# Patient Record
Sex: Male | Born: 1935 | Race: White | Hispanic: No | Smoking: Former smoker
Health system: Southern US, Community
[De-identification: ages and names within clinical notes are randomized; demographics above are authoritative.]

## PROBLEM LIST (undated history)

## (undated) DIAGNOSIS — H409 Unspecified glaucoma: Secondary | ICD-10-CM

## (undated) DIAGNOSIS — IMO0001 Reserved for inherently not codable concepts without codable children: Secondary | ICD-10-CM

## (undated) DIAGNOSIS — K219 Gastro-esophageal reflux disease without esophagitis: Secondary | ICD-10-CM

## (undated) DIAGNOSIS — M1990 Unspecified osteoarthritis, unspecified site: Secondary | ICD-10-CM

## (undated) DIAGNOSIS — C61 Malignant neoplasm of prostate: Secondary | ICD-10-CM

## (undated) DIAGNOSIS — K5900 Constipation, unspecified: Secondary | ICD-10-CM

## (undated) DIAGNOSIS — H9190 Unspecified hearing loss, unspecified ear: Secondary | ICD-10-CM

## (undated) DIAGNOSIS — G709 Myoneural disorder, unspecified: Secondary | ICD-10-CM

## (undated) DIAGNOSIS — R109 Unspecified abdominal pain: Secondary | ICD-10-CM

## (undated) DIAGNOSIS — G459 Transient cerebral ischemic attack, unspecified: Secondary | ICD-10-CM

## (undated) DIAGNOSIS — K529 Noninfective gastroenteritis and colitis, unspecified: Secondary | ICD-10-CM

## (undated) DIAGNOSIS — J449 Chronic obstructive pulmonary disease, unspecified: Secondary | ICD-10-CM

## (undated) DIAGNOSIS — Z972 Presence of dental prosthetic device (complete) (partial): Secondary | ICD-10-CM

## (undated) DIAGNOSIS — J45909 Unspecified asthma, uncomplicated: Secondary | ICD-10-CM

## (undated) DIAGNOSIS — R42 Dizziness and giddiness: Secondary | ICD-10-CM

## (undated) DIAGNOSIS — N4281 Prostatodynia syndrome: Secondary | ICD-10-CM

## (undated) DIAGNOSIS — I10 Essential (primary) hypertension: Secondary | ICD-10-CM

## (undated) DIAGNOSIS — H3530 Unspecified macular degeneration: Secondary | ICD-10-CM

## (undated) DIAGNOSIS — E785 Hyperlipidemia, unspecified: Secondary | ICD-10-CM

## (undated) DIAGNOSIS — I6529 Occlusion and stenosis of unspecified carotid artery: Secondary | ICD-10-CM

## (undated) DIAGNOSIS — I70209 Unspecified atherosclerosis of native arteries of extremities, unspecified extremity: Secondary | ICD-10-CM

## (undated) DIAGNOSIS — I2510 Atherosclerotic heart disease of native coronary artery without angina pectoris: Secondary | ICD-10-CM

## (undated) HISTORY — DX: Occlusion and stenosis of unspecified carotid artery: I65.29

## (undated) HISTORY — DX: Chronic obstructive pulmonary disease, unspecified: J44.9

## (undated) HISTORY — DX: Prostatodynia syndrome: N42.81

## (undated) HISTORY — DX: Gastro-esophageal reflux disease without esophagitis: K21.9

## (undated) HISTORY — DX: Unspecified atherosclerosis of native arteries of extremities, unspecified extremity: I70.209

## (undated) HISTORY — PX: CATARACT EXTRACTION: SUR2

## (undated) HISTORY — DX: Hyperlipidemia, unspecified: E78.5

## (undated) HISTORY — DX: Essential (primary) hypertension: I10

---

## 1984-02-16 HISTORY — PX: OTHER SURGICAL HISTORY: SHX169

## 1990-02-15 HISTORY — PX: HERNIA REPAIR: SHX51

## 1998-02-15 HISTORY — PX: CORONARY ARTERY BYPASS GRAFT: SHX141

## 1999-02-16 HISTORY — PX: PROSTATE SURGERY: SHX751

## 2001-02-15 HISTORY — PX: CAROTID ENDARTERECTOMY: SUR193

## 2004-06-08 ENCOUNTER — Ambulatory Visit: Payer: Self-pay | Admitting: Unknown Physician Specialty

## 2004-06-08 LAB — HM COLONOSCOPY

## 2007-06-28 ENCOUNTER — Ambulatory Visit: Payer: Self-pay | Admitting: Family Medicine

## 2009-06-16 ENCOUNTER — Ambulatory Visit: Payer: Self-pay | Admitting: Family Medicine

## 2009-09-17 ENCOUNTER — Ambulatory Visit: Payer: Self-pay | Admitting: Family Medicine

## 2010-01-28 ENCOUNTER — Ambulatory Visit: Payer: Self-pay | Admitting: Family Medicine

## 2012-06-28 DIAGNOSIS — H1810 Bullous keratopathy, unspecified eye: Secondary | ICD-10-CM | POA: Insufficient documentation

## 2012-08-16 ENCOUNTER — Encounter: Payer: Self-pay | Admitting: *Deleted

## 2012-11-21 DIAGNOSIS — N19 Unspecified kidney failure: Secondary | ICD-10-CM | POA: Insufficient documentation

## 2012-11-22 DIAGNOSIS — I10 Essential (primary) hypertension: Secondary | ICD-10-CM | POA: Insufficient documentation

## 2013-01-15 ENCOUNTER — Ambulatory Visit: Payer: Self-pay | Admitting: Oncology

## 2013-01-29 ENCOUNTER — Ambulatory Visit: Payer: Self-pay | Admitting: Family Medicine

## 2013-02-04 ENCOUNTER — Inpatient Hospital Stay: Payer: Self-pay | Admitting: Internal Medicine

## 2013-02-04 LAB — COMPREHENSIVE METABOLIC PANEL
Albumin: 3.7 g/dL (ref 3.4–5.0)
Alkaline Phosphatase: 65 U/L
Anion Gap: 4 — ABNORMAL LOW (ref 7–16)
BUN: 19 mg/dL — ABNORMAL HIGH (ref 7–18)
Bilirubin,Total: 0.4 mg/dL (ref 0.2–1.0)
Calcium, Total: 8.8 mg/dL (ref 8.5–10.1)
Chloride: 107 mmol/L (ref 98–107)
Co2: 29 mmol/L (ref 21–32)
Creatinine: 1.37 mg/dL — ABNORMAL HIGH (ref 0.60–1.30)
EGFR (African American): 54 — ABNORMAL LOW
EGFR (Non-African Amer.): 47 — ABNORMAL LOW
Glucose: 105 mg/dL — ABNORMAL HIGH (ref 65–99)
Osmolality: 282 (ref 275–301)
Potassium: 4.3 mmol/L (ref 3.5–5.1)
SGOT(AST): 15 U/L (ref 15–37)
SGPT (ALT): 17 U/L (ref 12–78)
Sodium: 140 mmol/L (ref 136–145)
Total Protein: 7 g/dL (ref 6.4–8.2)

## 2013-02-04 LAB — TROPONIN I: Troponin-I: <0.02 ng/mL

## 2013-02-04 LAB — LIPASE, BLOOD: Lipase: 247 U/L (ref 73–393)

## 2013-02-04 LAB — CBC
HCT: 43.3 % (ref 40.0–52.0)
HGB: 14.4 g/dL (ref 13.0–18.0)
MCH: 30.8 pg (ref 26.0–34.0)
MCHC: 33.2 g/dL (ref 32.0–36.0)
MCV: 93 fL (ref 80–100)
Platelet: 198 10*3/uL (ref 150–440)
RBC: 4.67 10*6/uL (ref 4.40–5.90)
RDW: 13.6 % (ref 11.5–14.5)
WBC: 8.2 10*3/uL (ref 3.8–10.6)

## 2013-02-05 LAB — URINALYSIS, COMPLETE
Bacteria: NONE SEEN
Bilirubin,UR: NEGATIVE
Blood: NEGATIVE
Glucose,UR: NEGATIVE mg/dL (ref 0–75)
Ketone: NEGATIVE
Leukocyte Esterase: NEGATIVE
Nitrite: NEGATIVE
Ph: 5 (ref 4.5–8.0)
Protein: NEGATIVE
RBC,UR: <1 /HPF (ref 0–5)
Specific Gravity: 1.051 (ref 1.003–1.030)
Squamous Epithelial: NONE SEEN
WBC UR: <1 /HPF (ref 0–5)

## 2013-02-06 HISTORY — PX: COLONOSCOPY: SHX174

## 2013-02-06 LAB — CBC WITH DIFFERENTIAL/PLATELET
Comment - H1-Com1: NORMAL
HCT: 39.2 % — ABNORMAL LOW (ref 40.0–52.0)
HGB: 13.2 g/dL (ref 13.0–18.0)
Lymphocytes: 30 %
MCH: 31.2 pg (ref 26.0–34.0)
MCHC: 33.7 g/dL (ref 32.0–36.0)
MCV: 93 fL (ref 80–100)
Monocytes: 7 %
Platelet: 163 10*3/uL (ref 150–440)
RBC: 4.22 10*6/uL — ABNORMAL LOW (ref 4.40–5.90)
RDW: 13.3 % (ref 11.5–14.5)
Segmented Neutrophils: 63 %
WBC: 6.3 10*3/uL (ref 3.8–10.6)

## 2013-02-06 LAB — BASIC METABOLIC PANEL
Anion Gap: 6 — ABNORMAL LOW (ref 7–16)
BUN: 12 mg/dL (ref 7–18)
Calcium, Total: 8.3 mg/dL — ABNORMAL LOW (ref 8.5–10.1)
Chloride: 111 mmol/L — ABNORMAL HIGH (ref 98–107)
Co2: 26 mmol/L (ref 21–32)
Creatinine: 1.14 mg/dL (ref 0.60–1.30)
EGFR (African American): >60
EGFR (Non-African Amer.): 58 — ABNORMAL LOW
Glucose: 106 mg/dL — ABNORMAL HIGH (ref 65–99)
Osmolality: 285 (ref 275–301)
Potassium: 4.4 mmol/L (ref 3.5–5.1)
Sodium: 143 mmol/L (ref 136–145)

## 2013-02-06 LAB — CEA: CEA: 1.3 ng/mL (ref 0.0–4.7)

## 2013-02-07 LAB — PATHOLOGY REPORT

## 2013-02-13 ENCOUNTER — Encounter: Payer: Self-pay | Admitting: General Surgery

## 2013-02-21 ENCOUNTER — Encounter: Payer: Self-pay | Admitting: General Surgery

## 2013-02-21 ENCOUNTER — Ambulatory Visit (INDEPENDENT_AMBULATORY_CARE_PROVIDER_SITE_OTHER): Payer: Medicare Other | Admitting: General Surgery

## 2013-02-21 VITALS — BP 130/78 | HR 70 | Resp 16 | Ht 69.0 in | Wt 207.0 lb

## 2013-02-21 DIAGNOSIS — K56609 Unspecified intestinal obstruction, unspecified as to partial versus complete obstruction: Secondary | ICD-10-CM

## 2013-02-21 DIAGNOSIS — K56699 Other intestinal obstruction unspecified as to partial versus complete obstruction: Secondary | ICD-10-CM

## 2013-02-21 NOTE — Progress Notes (Signed)
Patient ID: Johnathan [name], male   DOB: 1926/01/25, 80 y.o.   MRN: [id number]  Chief Complaint  Patient presents with  . Colonoscopy    colon stricture    HPI Johnathan [name] is a 80 y.o. male.  Here to discuss a colon stricture that was found during a colonoscopy done by Dr Candace Cruise on 02-06-13. States he was in the hospital 02-03-13 for 3-4 days. Currently denies abdominal pain. The original abdominal pain was generalized and it started back in October.  A CT scan was done while he was in the hospital. States his bowels move daily but he admits to being constipated.   HPI  Past Medical History  Diagnosis Date  . Hypertension   . COPD (chronic obstructive pulmonary disease)   . Hyperlipidemia   . Occlusion and stenosis of carotid artery without mention of cerebral infarction   . Atherosclerosis of native arteries of the extremities, unspecified   . Prostate pain   . GERD (gastroesophageal reflux disease)     Past Surgical History  Procedure Laterality Date  . Carotid endarterectomy  2003  . Coronary artery bypass graft  2000  . Hernia repair  1992  . Aortoiliac bypass   1986  . Colonoscopy  02-06-13    Dr Candace Cruise  . Prostate surgery  2001    History reviewed. No pertinent family history.  Social History History  Substance Use Topics  . Smoking status: Former Smoker -- 1.00 packs/day for 30 years    Quit date: 02/16/2003  . Smokeless tobacco: Never Used  . Alcohol Use: No    Allergies  Allergen Reactions  . Penicillins Rash    Current Outpatient Prescriptions  Medication Sig Dispense Refill  . amLODipine (NORVASC) 5 MG tablet Take 5 mg by mouth daily.       [name] aspirin 325 MG EC tablet Take 325 mg by mouth daily.      [name] docusate sodium (COLACE) 100 MG capsule Take 100 mg by mouth daily.      [name] omeprazole (PRILOSEC) 20 MG capsule Take 20 mg by mouth daily.       . simvastatin (ZOCOR) 20 MG tablet Take 20 mg by mouth daily at 6 PM.       . latanoprost (XALATAN) 0.005 %  ophthalmic solution Place 1 drop into both eyes at bedtime.        No current facility-administered medications for this visit.    Review of Systems Review of Systems  Constitutional: Negative.   Respiratory: Negative.   Cardiovascular: Negative.   Gastrointestinal: Positive for constipation. Negative for nausea, abdominal pain, diarrhea, blood in stool and anal bleeding.    Blood pressure 130/78, pulse 70, resp. rate 16, height 5\' 9"  (1.753 m), weight 207 lb (93.895 kg).  Physical Exam Physical Exam  Constitutional: He is oriented to person, place, and time. He appears well-developed and well-nourished.  Eyes: Conjunctivae are normal. No scleral icterus.  Neck: Neck supple. No tracheal deviation present. No mass and no thyromegaly present.  Cardiovascular: Normal rate, regular rhythm and normal heart sounds.   Pulses:      Posterior tibial pulses are 2+ on the right side, and 2+ on the left side.  No lower leg edema  Pulmonary/Chest: Effort normal and breath sounds normal.  Abdominal: Soft. Normal appearance. A hernia (tiny umbilical hernia, 1 cm redicible and nontender) is present.  Lymphadenopathy:    He has no cervical adenopathy.  Neurological: He is alert and oriented to  person, place, and time.  Skin: Skin is warm and dry.    [facility] records, colonoscopy and CT scan.Thickening of ascending colon above cecum is noted. There is calcifications at origin of celiac and SMA. Biopsy from colon showed no cancer.  CT was also reviewed with radiologist.  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     Plan    Will discuss with vascular internationalist regarding assessment of SMA and celiac artery for possible stent.  He may still need surgical resection or bypass if colon striucture fails to resolve.       SANKAR,SEEPLAPUTHUR G 02/21/2013, 11:03 AM

## 2013-02-21 NOTE — Patient Instructions (Addendum)
The patient is aware to call back for any questions or concerns. Dr Jamal Collin will talk to Dr Delana Meyer and call you with plan

## 2013-02-23 ENCOUNTER — Telehealth: Payer: Self-pay | Admitting: *Deleted

## 2013-02-23 DIAGNOSIS — K56609 Unspecified intestinal obstruction, unspecified as to partial versus complete obstruction: Secondary | ICD-10-CM

## 2013-02-23 NOTE — Telephone Encounter (Signed)
Patient's daughter, [name],  notified patient has been scheduled for an appointment to see Dr. Delana Meyer at [facility] for Monday, 02-26-13 at 9 am (arrive 8:45 am). Daughter will notify patient.   Records have been forwarded to Dr. Nino Parsley office for review.

## 2013-02-28 ENCOUNTER — Telehealth: Payer: Self-pay | Admitting: *Deleted

## 2013-02-28 NOTE — Telephone Encounter (Signed)
error 

## 2013-03-06 ENCOUNTER — Ambulatory Visit: Payer: Self-pay | Admitting: Vascular Surgery

## 2013-03-06 LAB — CREATININE, SERUM
Creatinine: 1.25 mg/dL (ref 0.60–1.30)
EGFR (African American): >60
EGFR (Non-African Amer.): 52 — ABNORMAL LOW

## 2013-03-06 LAB — BUN: BUN: 20 mg/dL — ABNORMAL HIGH (ref 7–18)

## 2013-03-18 ENCOUNTER — Ambulatory Visit: Payer: Self-pay | Admitting: Oncology

## 2013-03-21 ENCOUNTER — Ambulatory Visit: Payer: Self-pay | Admitting: General Surgery

## 2013-03-26 ENCOUNTER — Ambulatory Visit: Payer: Self-pay | Admitting: Family Medicine

## 2013-04-10 ENCOUNTER — Ambulatory Visit: Payer: Self-pay | Admitting: Vascular Surgery

## 2013-04-10 LAB — CREATININE, SERUM
Creatinine: 1.28 mg/dL (ref 0.60–1.30)
EGFR (African American): 59 — ABNORMAL LOW
EGFR (Non-African Amer.): 51 — ABNORMAL LOW

## 2013-04-10 LAB — BUN: BUN: 23 mg/dL — ABNORMAL HIGH (ref 7–18)

## 2013-04-17 ENCOUNTER — Encounter: Payer: Self-pay | Admitting: General Surgery

## 2013-04-26 ENCOUNTER — Encounter: Payer: Self-pay | Admitting: General Surgery

## 2013-04-26 ENCOUNTER — Ambulatory Visit (INDEPENDENT_AMBULATORY_CARE_PROVIDER_SITE_OTHER): Payer: Medicare Other | Admitting: General Surgery

## 2013-04-26 VITALS — BP 132/62 | HR 66 | Resp 12 | Ht 69.0 in | Wt 202.0 lb

## 2013-04-26 DIAGNOSIS — K56609 Unspecified intestinal obstruction, unspecified as to partial versus complete obstruction: Secondary | ICD-10-CM

## 2013-04-26 DIAGNOSIS — K56699 Other intestinal obstruction unspecified as to partial versus complete obstruction: Secondary | ICD-10-CM

## 2013-04-26 NOTE — Progress Notes (Signed)
Patient ID: Johnathan [name], male   DOB: [birth year], 80 y.o.   MRN: [id number]  Chief Complaint  Patient presents with  . Follow-up    colon stricture    HPI Johnathan [name] is a 80 y.o. male who presents for a follow up evaluation of a right colon ischemic stricture. The patient states he is doing well overall. No new complaints. No pain. Bowels are slow. Patient underwent stent placement SMA and Celiac arteries. Also had stents for renal arteries.   HPI  Past Medical History  Diagnosis Date  . Hypertension   . COPD (chronic obstructive pulmonary disease)   . Hyperlipidemia   . Occlusion and stenosis of carotid artery without mention of cerebral infarction   . Atherosclerosis of native arteries of the extremities, unspecified   . Prostate pain   . GERD (gastroesophageal reflux disease)     Past Surgical History  Procedure Laterality Date  . Carotid endarterectomy  2003  . Coronary artery bypass graft  2000  . Hernia repair  1992  . Aortoiliac bypass   1986  . Colonoscopy  02-06-13    Dr Candace Cruise  . Prostate surgery  2001    History reviewed. No pertinent family history.  Social History History  Substance Use Topics  . Smoking status: Former Smoker -- 1.00 packs/day for 30 years    Quit date: 02/16/2003  . Smokeless tobacco: Never Used  . Alcohol Use: No    Allergies  Allergen Reactions  . Penicillins Rash    Current Outpatient Prescriptions  Medication Sig Dispense Refill  . amLODipine (NORVASC) 5 MG tablet Take 5 mg by mouth daily.       [name] aspirin 325 MG EC tablet Take 325 mg by mouth daily.      [name] docusate sodium (COLACE) 100 MG capsule Take 100 mg by mouth daily.      [name] latanoprost (XALATAN) 0.005 % ophthalmic solution Place 1 drop into both eyes at bedtime.       [name] omeprazole (PRILOSEC) 20 MG capsule Take 20 mg by mouth daily.       . polyethylene glycol (MIRALAX / GLYCOLAX) packet Take 17 g by mouth daily.      . simvastatin (ZOCOR) 20 MG tablet Take 20 mg by mouth  daily at 6 PM.        No current facility-administered medications for this visit.    Review of Systems Review of Systems  Constitutional: Negative.   Respiratory: Negative.   Cardiovascular: Negative.   Gastrointestinal: Negative.     Blood pressure 132/62, pulse 66, resp. rate 12, height 5' 9" (1.753 m), weight 202 lb (91.627 kg).  Physical Exam Physical Exam  Constitutional: He is oriented to person, place, and time. He appears well-developed and well-nourished.  Neck: Neck supple. No tracheal deviation present. No thyromegaly present.  Cardiovascular: Normal rate, regular rhythm and normal heart sounds.   No murmur heard. Pulmonary/Chest: Effort normal and breath sounds normal.  Abdominal: Soft. Normal appearance and bowel sounds are normal. There is no hepatosplenomegaly. There is no tenderness. No hernia.  Neurological: He is alert and oriented to person, place, and time.  Skin: Skin is warm and dry.    Data Reviewed Dr. Nino Parsley notes.   Assessment    Ischemic stricture ascending colon. Blood flow corrected.     Plan    BE to assess right colon stricture.   Patient to have a barium enema single contrast at [facility] on 05-04-13 at 9:30  am (arrive 9 am). Prep: NPO after midnight. Patient to pick up prep kit at the radiology desk no later than Tuesday of next week (this is to allow him to complete 48 hour prep prior to test). This patient and his son are aware of all instructions.   SANKAR,SEEPLAPUTHUR G 04/26/2013, 1:32 PM    

## 2013-04-26 NOTE — Patient Instructions (Addendum)
The patient is aware to call back for any questions or concerns.   Patient to have a barium enema single contrast at [facility] on 05-04-13 at 9:30 am (arrive 9 am). Prep: NPO after midnight. Patient to pick up prep kit at the radiology desk no later than Tuesday of next week (this is to allow him to complete 48 hour prep prior to test). This patient and his son are aware of all instructions.

## 2013-05-04 ENCOUNTER — Ambulatory Visit: Payer: Self-pay | Admitting: General Surgery

## 2013-05-06 ENCOUNTER — Encounter: Payer: Self-pay | Admitting: General Surgery

## 2013-05-08 NOTE — Progress Notes (Signed)
No answer at home number.

## 2013-05-09 NOTE — Progress Notes (Signed)
Patient has been scheduled for a follow up appointment on 05-16-13. He is aware of date, time, and instructions.

## 2013-05-16 ENCOUNTER — Ambulatory Visit (INDEPENDENT_AMBULATORY_CARE_PROVIDER_SITE_OTHER): Payer: Medicare Other | Admitting: General Surgery

## 2013-05-16 ENCOUNTER — Encounter: Payer: Self-pay | Admitting: General Surgery

## 2013-05-16 VITALS — BP 140/62 | HR 58 | Resp 16 | Ht 69.0 in | Wt 203.0 lb

## 2013-05-16 DIAGNOSIS — K56699 Other intestinal obstruction unspecified as to partial versus complete obstruction: Secondary | ICD-10-CM

## 2013-05-16 DIAGNOSIS — K56609 Unspecified intestinal obstruction, unspecified as to partial versus complete obstruction: Secondary | ICD-10-CM

## 2013-05-16 NOTE — Patient Instructions (Addendum)
Patient to return in 2 months. The patient is aware to call back for any questions or concerns. Patient is advised to call with any new abdominal problems.

## 2013-05-16 NOTE — Progress Notes (Signed)
Patient ID: Johnathan [name], male   DOB: [birth year], 80 y.o.   MRN: [id number]  The patient presents for a discussion of his most recent test results. He had a Barium Enema on 05/04/13. He denies any new problems at this time.  No abd pain, no change in bowel habits.  He has ischemic stricture of right colon. Has had stents in SMA and Celiac done.   BE showed an area of narrowing above cecum but no obstruction to contrast flow. Discussed this with him in full. Plan on seeing him back in 6 weeks. If asymptomatic will plan for a follow up colonoscopy to reassess and biopsy the area of stricture.

## 2013-06-28 ENCOUNTER — Encounter: Payer: Self-pay | Admitting: General Surgery

## 2013-06-28 ENCOUNTER — Ambulatory Visit (INDEPENDENT_AMBULATORY_CARE_PROVIDER_SITE_OTHER): Payer: Medicare Other | Admitting: General Surgery

## 2013-06-28 VITALS — BP 154/68 | HR 56 | Resp 12 | Ht 69.0 in | Wt 205.0 lb

## 2013-06-28 DIAGNOSIS — K56699 Other intestinal obstruction unspecified as to partial versus complete obstruction: Secondary | ICD-10-CM

## 2013-06-28 DIAGNOSIS — K56609 Unspecified intestinal obstruction, unspecified as to partial versus complete obstruction: Secondary | ICD-10-CM

## 2013-06-28 MED ORDER — POLYETHYLENE GLYCOL 3350 17 GM/SCOOP PO POWD: 1.0000 | Freq: Once | ORAL | Status: DC

## 2013-06-28 NOTE — Patient Instructions (Addendum)
The patient is aware to call back for any questions or concerns.  Colonoscopy A colonoscopy is an exam to look at the entire large intestine (colon). This exam can help find problems such as tumors, polyps, inflammation, and areas of bleeding. The exam takes about 1 hour.  LET [facility] CARE PROVIDER KNOW ABOUT:   Any allergies you have.  All medicines you are taking, including vitamins, herbs, eye drops, creams, and over-the-counter medicines.  Previous problems you or members of your family have had with the use of anesthetics.  Any blood disorders you have.  Previous surgeries you have had.  Medical conditions you have. RISKS AND COMPLICATIONS  Generally, this is a safe procedure. However, as with any procedure, complications can occur. Possible complications include:  Bleeding.  Tearing or rupture of the colon wall.  Reaction to medicines given during the exam.  Infection (rare). BEFORE THE PROCEDURE   Ask your health care provider about changing or stopping your regular medicines.  You may be prescribed an oral bowel prep. This involves drinking a large amount of medicated liquid, starting the day before your procedure. The liquid will cause you to have multiple loose stools until your stool is almost clear or light green. This cleans out your colon in preparation for the procedure.  Do not eat or drink anything else once you have started the bowel prep, unless your health care provider tells you it is safe to do so.  Arrange for someone to drive you home after the procedure. PROCEDURE   You will be given medicine to help you relax (sedative).  You will lie on your side with your knees bent.  A long, flexible tube with a light and camera on the end (colonoscope) will be inserted through the rectum and into the colon. The camera sends video back to a computer screen as it moves through the colon. The colonoscope also releases carbon dioxide gas to inflate the colon.  This helps your health care provider see the area better.  During the exam, your health care provider may take a small tissue sample (biopsy) to be examined under a microscope if any abnormalities are found.  The exam is finished when the entire colon has been viewed. AFTER THE PROCEDURE   Do not drive for 24 hours after the exam.  You may have a small amount of blood in your stool.  You may pass moderate amounts of gas and have mild abdominal cramping or bloating. This is caused by the gas used to inflate your colon during the exam.  Ask when your test results will be ready and how you will get your results. Make sure you get your test results. Document Released: 01/30/2000 Document Revised: 11/22/2012 Document Reviewed: 10/09/2012 [facility] Patient Information 2014 [place].  Patient has been scheduled for a colonoscopy at [facility] on 07/17/13. He will reduce his 325mg  aspirin to 81mg  three days prior. He will only take his Norvasc at 6 am the morning of his colonoscopy. He will pre register with the hospital at least 2 business days prior. Patient is aware of date and all instructions.

## 2013-06-28 NOTE — Progress Notes (Signed)
Patient ID: JAXYN ROUT, male   DOB: 1926-01-05, 80 y.o.   MRN: [id number]  Chief Complaint  Patient presents with  . Follow-up    colonic stricture    HPI Johnathan [name] is a 80 y.o. male.  Here today for follow up colonic stricture. He had a Barium Enema on 05/04/13. He denies any new problems at this time.  No abd pain, no change in bowel habits. Bowels move 1-3 times a day on average. He has ischemic stricture of right colon. Has had stents in SMA and Celiac done.  BE showed an area of narrowing above cecum but no obstruction to contrast flow.   HPI  Past Medical History  Diagnosis Date  . Hypertension   . COPD (chronic obstructive pulmonary disease)   . Hyperlipidemia   . Occlusion and stenosis of carotid artery without mention of cerebral infarction   . Atherosclerosis of native arteries of the extremities, unspecified   . Prostate pain   . GERD (gastroesophageal reflux disease)     Past Surgical History  Procedure Laterality Date  . Carotid endarterectomy  2003  . Coronary artery bypass graft  2000  . Hernia repair  1992  . Aortoiliac bypass   1986  . Colonoscopy  02-06-13    Dr Candace Cruise  . Prostate surgery  2001    History reviewed. No pertinent family history.  Social History History  Substance Use Topics  . Smoking status: Former Smoker -- 1.00 packs/day for 30 years    Quit date: 02/16/2003  . Smokeless tobacco: Never Used  . Alcohol Use: No    Allergies  Allergen Reactions  . Penicillins Rash    Current Outpatient Prescriptions  Medication Sig Dispense Refill  . amLODipine (NORVASC) 5 MG tablet Take 5 mg by mouth daily.       [name] aspirin 325 MG EC tablet Take 325 mg by mouth daily.      . brimonidine (ALPHAGAN) 0.2 % ophthalmic solution Place 1 drop into both eyes 3 (three) times daily.      [name] latanoprost (XALATAN) 0.005 % ophthalmic solution Place 1 drop into both eyes at bedtime.       [name] NEXIUM 20 MG capsule Take 20 mg by mouth daily at 12 noon.       .  prednisoLONE acetate (PRED FORTE) 1 % ophthalmic suspension Place 1 drop into both eyes 4 (four) times daily.      . simvastatin (ZOCOR) 20 MG tablet Take 20 mg by mouth daily at 6 PM.       . polyethylene glycol powder (GLYCOLAX/MIRALAX) powder Take 255 g (1 Container total) by mouth once.  255 g  0   No current facility-administered medications for this visit.    Review of Systems Review of Systems  Constitutional: Negative.   Respiratory: Negative.   Cardiovascular: Negative.     Blood pressure 154/68, pulse 56, resp. rate 12, height 5\' 9"  (1.753 m), weight 205 lb (92.987 kg).  Physical Exam Physical Exam  Constitutional: He is oriented to person, place, and time. He appears well-developed and well-nourished.  Eyes: Conjunctivae are normal.  Neck: Neck supple.  Cardiovascular: Normal rate, regular rhythm and normal heart sounds.   Pulmonary/Chest: Effort normal. He has wheezes.  Wheeze noted left chest  Abdominal: Soft. Normal appearance. There is no tenderness.  Lymphadenopathy:    He has no cervical adenopathy.  Neurological: He is alert and oriented to person, place, and time.  Skin: Skin is  warm and dry.    Data Reviewed None  Assessment    Stable physical exam. As noted before in last office note, will repeat colonoscopy and reassess/biopsy the stricture in right colon.    Plan       Colonoscopy with possible biopsy/polypectomy prn: Information regarding the procedure, including its potential risks and complications (including but not limited to perforation of the bowel, which may require emergency surgery to repair, and bleeding) was verbally given to the patient. Educational information regarding lower instestinal endoscopy was given to the patient. Written instructions for how to complete the bowel prep using Miralax were provided. The importance of drinking ample fluids to avoid dehydration as a result of the prep emphasized.  Patient has been scheduled for a  colonoscopy at [facility] on 07/17/13. He will reduce his 325mg  aspirin to 81mg  three days prior. He will only take his Norvasc at 6 am the morning of his colonoscopy. He will pre register with the hospital at least 2 business days prior. Patient is aware of date and all instructions.   Clyda Smyth G Arush Gatliff 06/28/2013, 10:47 AM

## 2013-07-03 ENCOUNTER — Other Ambulatory Visit: Payer: Self-pay

## 2013-07-03 DIAGNOSIS — K56699 Other intestinal obstruction unspecified as to partial versus complete obstruction: Secondary | ICD-10-CM

## 2013-07-03 MED ORDER — POLYETHYLENE GLYCOL 3350 17 GM/SCOOP PO POWD: 1.0000 | Freq: Once | ORAL | Status: DC

## 2013-07-10 ENCOUNTER — Other Ambulatory Visit: Payer: Self-pay | Admitting: General Surgery

## 2013-07-10 DIAGNOSIS — K56699 Other intestinal obstruction unspecified as to partial versus complete obstruction: Secondary | ICD-10-CM

## 2013-07-17 ENCOUNTER — Ambulatory Visit: Payer: Self-pay | Admitting: General Surgery

## 2013-07-17 DIAGNOSIS — K5730 Diverticulosis of large intestine without perforation or abscess without bleeding: Secondary | ICD-10-CM

## 2013-07-17 DIAGNOSIS — K56609 Unspecified intestinal obstruction, unspecified as to partial versus complete obstruction: Secondary | ICD-10-CM

## 2013-07-18 ENCOUNTER — Encounter: Payer: Self-pay | Admitting: General Surgery

## 2013-07-20 LAB — PATHOLOGY REPORT

## 2013-07-23 ENCOUNTER — Encounter: Payer: Self-pay | Admitting: General Surgery

## 2013-07-25 ENCOUNTER — Telehealth: Payer: Self-pay | Admitting: General Surgery

## 2013-07-25 NOTE — Telephone Encounter (Signed)
PATIENT CALLED TO CHECK ON COLONOSCOPY RESULTS DONE ON 07-17-13.

## 2013-08-07 ENCOUNTER — Telehealth: Payer: Self-pay | Admitting: *Deleted

## 2013-08-07 NOTE — Telephone Encounter (Signed)
Pt is calling to check on Colonoscopy results that was done on 07/17/13

## 2013-08-13 ENCOUNTER — Telehealth: Payer: Self-pay | Admitting: General Surgery

## 2013-08-13 NOTE — Telephone Encounter (Signed)
Colonoscopy did show stricture in ascending colon. rebiopsy-no neoplastic changes.  Pt has symptom of constipation, pain or abd distension.  Will follow for now. To return in 45mos.

## 2013-10-31 ENCOUNTER — Ambulatory Visit (INDEPENDENT_AMBULATORY_CARE_PROVIDER_SITE_OTHER): Payer: Medicare Other | Admitting: General Surgery

## 2013-10-31 ENCOUNTER — Encounter: Payer: Self-pay | Admitting: General Surgery

## 2013-10-31 VITALS — BP 148/88 | HR 58 | Resp 12 | Ht 69.0 in | Wt 207.0 lb

## 2013-10-31 DIAGNOSIS — K56699 Other intestinal obstruction unspecified as to partial versus complete obstruction: Secondary | ICD-10-CM

## 2013-10-31 DIAGNOSIS — K56609 Unspecified intestinal obstruction, unspecified as to partial versus complete obstruction: Secondary | ICD-10-CM

## 2013-10-31 NOTE — Patient Instructions (Signed)
The patient is aware to call back for any questions or concerns.  

## 2013-10-31 NOTE — Progress Notes (Signed)
Patient ID: Johnathan [name], male   DOB: [birth year], 80 y.o.   MRN: [id number]  Chief Complaint  Patient presents with  . Follow-up    3 month follow up colonic stricture     HPI Johnathan [name] is a 80 y.o. male who presents for a 3 month follow up. His last colonoscopy was performed in May 2015. He follows up for a right colon stricture from ischemia. He had dilatation of SMA after. He denies any problems with the bowels at this time. Patient is doing well.   HPI  Past Medical History  Diagnosis Date  . Hypertension   . COPD (chronic obstructive pulmonary disease)   . Hyperlipidemia   . Occlusion and stenosis of carotid artery without mention of cerebral infarction   . Atherosclerosis of native arteries of the extremities, unspecified   . Prostate pain   . GERD (gastroesophageal reflux disease)     Past Surgical History  Procedure Laterality Date  . Carotid endarterectomy  2003  . Coronary artery bypass graft  2000  . Hernia repair  1992  . Aortoiliac bypass   1986  . Colonoscopy  02-06-13    Dr Candace Cruise  . Prostate surgery  2001    History reviewed. No pertinent family history.  Social History History  Substance Use Topics  . Smoking status: Former Smoker -- 1.00 packs/day for 30 years    Quit date: 02/16/2003  . Smokeless tobacco: Never Used  . Alcohol Use: No    Allergies  Allergen Reactions  . Sulfa Antibiotics Other (See Comments)  . Penicillins Rash    Current Outpatient Prescriptions  Medication Sig Dispense Refill  . amLODipine (NORVASC) 5 MG tablet Take 5 mg by mouth daily.       [name] aspirin 325 MG EC tablet Take 325 mg by mouth daily.      . brimonidine (ALPHAGAN) 0.2 % ophthalmic solution Place 1 drop into both eyes 3 (three) times daily.      [name] latanoprost (XALATAN) 0.005 % ophthalmic solution Place 1 drop into both eyes at bedtime.       [name] NEXIUM 20 MG capsule Take 20 mg by mouth daily at 12 noon.       . polyethylene glycol powder (GLYCOLAX/MIRALAX) powder  Take 255 g (1 Container total) by mouth once. Mix container with 64 oz (2 quarts) of clear liquids  255 g  0  . prednisoLONE acetate (PRED FORTE) 1 % ophthalmic suspension Place 1 drop into both eyes 4 (four) times daily.      . simvastatin (ZOCOR) 20 MG tablet Take 20 mg by mouth daily at 6 PM.        No current facility-administered medications for this visit.    Review of Systems Review of Systems  Constitutional: Negative.   Respiratory: Negative.   Cardiovascular: Negative.   Gastrointestinal: Negative.     Blood pressure 148/88, pulse 58, resp. rate 12, height 5\' 9"  (1.753 m), weight 207 lb (93.895 kg).  Physical Exam Physical Exam  Constitutional: He is oriented to person, place, and time. He appears well-developed and well-nourished.  Eyes: Conjunctivae are normal. No scleral icterus.  Cardiovascular: Normal rate, regular rhythm and normal heart sounds.   No murmur heard. Pulmonary/Chest: Effort normal and breath sounds normal.  Abdominal: Soft. Normal appearance and bowel sounds are normal. There is no hepatosplenomegaly. There is no tenderness. A hernia (small incisional hernia present. Unchanged from before.) is present.  Neurological: He is alert and  oriented to person, place, and time.  Skin: Skin is warm and dry.    Data Reviewed Prior notes  Assessment    Stable asymptomatic right colon stricture. No obstructive symptoms.      Plan    Return to routine yearly follow up for his vascular assessment and doppler due in Feb. 2016.       Criss Alvine F 10/31/2013, 8:43 AM

## 2013-11-06 DIAGNOSIS — I2510 Atherosclerotic heart disease of native coronary artery without angina pectoris: Secondary | ICD-10-CM | POA: Insufficient documentation

## 2014-04-02 ENCOUNTER — Ambulatory Visit (INDEPENDENT_AMBULATORY_CARE_PROVIDER_SITE_OTHER): Payer: Medicare Other | Admitting: General Surgery

## 2014-04-02 ENCOUNTER — Encounter: Payer: Self-pay | Admitting: General Surgery

## 2014-04-02 ENCOUNTER — Ambulatory Visit: Payer: Self-pay

## 2014-04-02 VITALS — BP 144/70 | HR 60 | Resp 14 | Ht 69.0 in | Wt 210.0 lb

## 2014-04-02 DIAGNOSIS — K5669 Other intestinal obstruction: Secondary | ICD-10-CM

## 2014-04-02 DIAGNOSIS — I6529 Occlusion and stenosis of unspecified carotid artery: Secondary | ICD-10-CM

## 2014-04-02 DIAGNOSIS — I739 Peripheral vascular disease, unspecified: Secondary | ICD-10-CM

## 2014-04-02 DIAGNOSIS — K56699 Other intestinal obstruction unspecified as to partial versus complete obstruction: Secondary | ICD-10-CM

## 2014-04-02 NOTE — Progress Notes (Deleted)
He is here today for carotid duplex study. No new complaints. No dizziness noted.

## 2014-04-02 NOTE — Patient Instructions (Signed)
The patient is aware to call back for any questions or concerns.  

## 2014-04-02 NOTE — Progress Notes (Signed)
Patient ID: Johnathan [name], male   DOB: [birth year], 81 y.o.   MRN: [id number]  Chief Complaint  Patient presents with  . Other    carotid duplex    HPI Johnathan BOOHER is a 81 y.o. male.  Here today for carotid duplex study. He has no new complaints. No dizziness. Bowels move daily. He also has a history of PAD.  He denies any abdominal pain. 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 He had dilatation of SMA after. He is followed by Dr. Delana Meyer.   HPI  Past Medical History  Diagnosis Date  . Hypertension   . COPD (chronic obstructive pulmonary disease)   . Hyperlipidemia   . Occlusion and stenosis of carotid artery without mention of cerebral infarction   . Atherosclerosis of native arteries of the extremities, unspecified   . Prostate pain   . GERD (gastroesophageal reflux disease)     Past Surgical History  Procedure Laterality Date  . Carotid endarterectomy  2003  . Coronary artery bypass graft  2000  . Hernia repair  1992  . Aortoiliac bypass   1986  . Colonoscopy  02-06-13    Dr Candace Cruise  . Prostate surgery  2001    History reviewed. No pertinent family history.  Social History History  Substance Use Topics  . Smoking status: Former Smoker -- 1.00 packs/day for 30 years    Quit date: 02/16/2003  . Smokeless tobacco: Never Used  . Alcohol Use: No    Allergies  Allergen Reactions  . Sulfa Antibiotics Other (See Comments)  . Penicillins Rash    Current Outpatient Prescriptions  Medication Sig Dispense Refill  . amLODipine (NORVASC) 5 MG tablet Take 5 mg by mouth daily.     [name] aspirin 325 MG EC tablet Take 325 mg by mouth daily.    . brimonidine (ALPHAGAN) 0.2 % ophthalmic solution Place 1 drop into both eyes 3 (three) times daily.    [name] latanoprost (XALATAN) 0.005 % ophthalmic solution Place 1 drop into both eyes at bedtime.     [name] NEXIUM 20 MG capsule Take 20 mg by mouth daily at 12 noon.     . Polyethylene Glycol 3350 (MIRALAX PO) Take by mouth  as needed.    . prednisoLONE acetate (PRED FORTE) 1 % ophthalmic suspension Place 1 drop into both eyes 4 (four) times daily.    . simvastatin (ZOCOR) 20 MG tablet Take 20 mg by mouth daily at 6 PM.      No current facility-administered medications for this visit.    Review of Systems Review of Systems  Constitutional: Negative.   Respiratory: Negative.   Cardiovascular: Negative.   Neurological: Negative for dizziness.    Blood pressure 144/70, pulse 60, resp. rate 14, height 5\' 9"  (1.753 m), weight 210 lb (95.255 kg).  Physical Exam Physical Exam  Constitutional: He is oriented to person, place, and time. He appears well-developed and well-nourished.  Eyes: Conjunctivae are normal. No scleral icterus.  Neck: Neck supple.  Cardiovascular: Normal rate, regular rhythm and normal heart sounds.   Pulses:      Carotid pulses are 0 on the right side with bruit, and 2+ on the left side with bruit.      Radial pulses are 2+ on the right side, and 2+ on the left side.       Femoral pulses are 2+ on the right side, and 2+ on the left side.      Popliteal pulses  are 2+ on the right side, and 2+ on the left side.       Dorsalis pedis pulses are 2+ on the right side, and 0 on the left side.       Posterior tibial pulses are 2+ on the right side, and 2+ on the left side.  No lower leg edema.  Pulmonary/Chest: Effort normal and breath sounds normal.  Abdominal: Soft. Normal appearance. There is no tenderness. A hernia is present.    Umbilical hernia unchanged and stable.  Lymphadenopathy:    He has no cervical adenopathy.  Neurological: He is alert and oriented to person, place, and time.  Skin: Skin is warm and dry.    Data Reviewed Previous notes and carotid ultrasound. Today's duplex shows minimal plaquing noted with no stenosis on the left, right side remains occluded.  Assessment    PAD, carotid artery disease and ischemic right colonic stricture all stable.    Plan      Follow up in one year or sooner if new symptoms develop.       Sascha Baugher G 04/02/2014, 9:38 AM

## 2014-06-07 NOTE — H&P (Signed)
PATIENT NAME:  Johnathan [name], Johnathan [name] MR#:  [id number] DATE OF BIRTH:  [birth year]  DATE OF ADMISSION:  02/04/2013  REFERRING PHYSICIAN: Dr. Harvest Dark.   PRIMARY CARE PHYSICIAN: Dr. Rosanna Randy.   CHIEF COMPLAINT: Abdominal pain.   HISTORY OF PRESENT ILLNESS: An 81 year old Caucasian gentleman with past medical history of coronary artery disease status post CABG back in the early 1990s, presenting with abdominal pain. Describes abdominal pain epigastric in location, sharp in quality and nonradiating. It has been present for 3 months, though progressively worsening. As far as causative factors, it is occasionally worsened by food. He has found no relieving factors. States it is fairly constant but does wax and wane throughout the day. He is presenting today because the pain has worsened to now 10 out of 10 in intensity. He has associated constipation, though last bowel movement today was small caliber stool. He also denotes weight loss of 12 pounds in 1 month. Denies any fevers, chills, nausea or vomiting. In the ER, a CT scan was performed revealing focal wall thickening along the cecum and proximal ascending colon measuring about 6.8 cm, suspicious for primary colon malignancy. There is also associated soft tissue stranding and trace pericolic fluid. Case was discussed with oncology and surgery both on call, and they are aware of the case.   REVIEW OF SYSTEMS:   CONSTITUTIONAL: Denies fever, fatigue, weakness. Positive, however, for weight loss of 12 pounds in 1 month.  EYES: Denies blurred vision, double vision, eye pain.  EARS, NOSE, THROAT: Denies tinnitus, ear pain, hearing loss.  RESPIRATORY: Denies cough, wheeze, shortness of breath.  CARDIOVASCULAR: Denies chest pain, palpitations, edema.  GASTROINTESTINAL: Positive for abdominal pain and constipation as described above; however, denies nausea or vomiting.  GENITOURINARY: Denies dysuria, hematuria.  ENDOCRINE: Denies nocturia or thyroid  problems.  HEMATOLOGIC AND LYMPHATIC: Denies easy bruising or bleeding.  SKIN: Denies rashes or lesions.  MUSCULOSKELETAL: Denies pain in neck, back, shoulders, knees, hips or arthritic symptoms.  NEUROLOGIC: Denies paralysis, paresthesias.  PSYCHIATRIC: Denies anxiety or depressive symptoms.   PAST MEDICAL HISTORY: Coronary artery disease status post CABG, as well as prostatectomy   SOCIAL HISTORY: Remote history of smoking. Denies any alcohol or drug usage.   FAMILY HISTORY: Positive for coronary artery disease; however, no history of colon cancer.   ALLERGIES: PENICILLIN.   HOME MEDICATIONS: Include aspirin 325 mg p.o. daily, simvastatin 40 mg p.o. daily.   PHYSICAL EXAMINATION:  VITAL SIGNS: Temperature 97.6, heart rate 63, respirations 16, blood pressure 152/55, saturating 94% on room air. Weight 95.3 kg, BMI 31.  GENERAL: Well-nourished, well-developed, Caucasian gentleman, currently in minimal to moderate distress secondary to abdominal pain.  HEAD: Normocephalic, atraumatic.  EYES: Pupils equal, round and reactive to light. Extraocular muscles intact. No scleral icterus.  MOUTH: Moist mucosal membranes. Dentition intact. No abscesses noted.  EARS, NOSE, THROAT: Throat clear without exudates. No external lesions.  NECK: Supple. No thyromegaly. No nodules. No JVD.  PULMONARY: Clear to auscultation bilaterally without wheezes, rubs, rhonchi. No use of accessory muscles. Good respiratory effort.  CHEST: Nontender to palpation.  CARDIOVASCULAR: S1, S2, regular rate and rhythm. No murmurs, rubs or gallops. No edema. Pedal pulses 2+ bilaterally.  GASTROINTESTINAL: Soft, mild tenderness to palpation over epigastric region without rebound or guarding or motion tenderness, nondistended. No masses. Positive bowel sounds. No hepatosplenomegaly.  MUSCULOSKELETAL: No swelling, clubbing or edema. Range of motion full in all extremities.  NEUROLOGIC: Cranial nerves II through XII intact. No  gross focal neurological deficits.  Sensation intact. Reflexes intact.  SKIN: No ulcerations, lesions, rash or cyanosis. Skin warm, dry. Turgor is intact.  PSYCHIATRIC: Mood and affect within normal limits. The patient is awake, alert and oriented x 3. Insight and judgment intact.   LABORATORY DATA: Sodium 140, potassium 4.3, chloride 107, bicarb 29, BUN 19, creatinine 1.37, glucose 105. LFTs within normal limits. WBC 8.2, hemoglobin 14.4, platelets 198. CAT scan performed revealing focal wall thickening along the cecum and proximal ascending colon measuring 6.8 cm in length, with wall thickening measuring 1.5 cm. Suspicious for primary colonic malignancy. There is also noted associated soft tissue stranding and trace pericolic fluid and distention of the appendix up to 1.1 cm.   ASSESSMENT AND PLAN: An 81 year old Caucasian gentleman with past medical history of coronary artery disease status post coronary artery bypass graft, presenting with abdominal pain. CT revealed what appears to be a primary colonic malignancy.  1. Abdominal pain with colon cancer: Case discussed with on call surgeon and oncologist. It has been noted that he will need surgery; however, it is not emergent, with unknown timing. Will make him n.p.o. past midnight. Provide pain control. Given soft tissue stranding, will also add antibiotic coverage for gram-negative and anaerobic organisms. Currently, his pain is better controlled after receiving Dilaudid in the Emergency Department.  2. Preoperative duration: He does have a history of coronary artery disease status post coronary artery bypass graft. His metabolic activity is less than 4, limited by knee pain; however, he has no active anginal symptoms, congestive heart failure symptoms, arrhythmias or significant valvular disease. He should be considered a moderate risk for moderate risk surgery from a cardiac standpoint which will require no further testing from the cardiac standpoint  prior to surgery aside from EKG which is pending at this time. If EKG is normal, he will be able to proceed with surgery from a cardiac standpoint.  3. Coronary artery disease, status post coronary artery bypass graft: Continue with statin therapy and aspirin.  4. Mild acute kidney injury: Intravenous fluid hydration.  5. Venous thromboembolism prophylaxis with sequential compression devices.   The patient is FULL CODE.   TIME SPENT: 45 minutes.   ____________________________ Aaron Mose. Eustacia Urbanek, MD dkh:gb D: 02/04/2013 59:93:57 ET T: 02/05/2013 01:32:28 ET JOB#: [id number]  cc: Aaron Mose. Ireta Pullman, MD, <Dictator> Alfio Loescher Woodfin Ganja MD ELECTRONICALLY SIGNED 02/05/2013 21:45

## 2014-06-07 NOTE — Consult Note (Signed)
Colonoscopy showed area in ascending colon with strictured, erythematous, and firm area. Not an obvious mass but multiple biopsies taken. Due to stricture, could not advance scope to cecum though clearly visible from a distance. Cannot r/o malignancy but could also be ischemic colitis. Await bx. Full liquid diet ordered.  Electronic Signatures: Verdie Shire (MD)  (Signed on 23-Dec-14 11:11)  Authored  Last Updated: 23-Dec-14 11:11 by Verdie Shire (MD)

## 2014-06-07 NOTE — Consult Note (Signed)
PATIENT NAME:  Johnathan [name], Johnathan [name] MR#:  [id number] DATE OF BIRTH:  Apr 19, 1925  DATE OF CONSULTATION:  02/06/2013  REFERRING PHYSICIAN:  Prime Doc CONSULTING PHYSICIAN:  Rodena Goldmann III, MD  PRIMARY CARE PHYSICIAN:  Dr. Miguel Aschoff  CHIEF COMPLAINT: Abdominal pain.   BRIEF HISTORY: Mr. Johnathan [name] is an 81 year old gentleman with multiple  problems in the past with a vascular occlusive disease, presenting Emergency Room with a chief complaint of abdominal pain. He has had several months with abdominal pain. He  has lost approximately 10 to 12 pounds. He was seen at the [facility] about three months ago following pretty severe episode. A CT Scan was performed, and I do not have those results available. He did not have any colonoscopy or direct visualization of his bowel at that time. His last colonoscopy was 10 years ago prior to today. He does have some problems with significant constipation, but has had intermittent diarrhea in addition.   He presented to the Emergency Room with similar symptoms, was admitted for further evaluation after contrasted CT demonstrated what appeared to be a large right colon mass. At the time of admission, laboratory values reveal slightly elevated BUN and creatinine with a creatinine of 1.37, normal albumin, normal liver function studies, normal white count, normal hemoglobin. He was admitted for further diagnostic work-up, possible colonoscopy.   The patient has history of significant vascular occlusive disease having undergone an aortobifemoral bypass graft in the past. He has also undergone coronary artery bypass grafting in the past. He has history of prostate cancer treated with prostatectomy 12 years ago, glaucoma, hypertension, gastroesophageal reflux disease.   CURRENT MEDICATIONS: Outlined in his admission note.   ALLERGIES: HE IS ALLERGIC TO PENICILLIN.   REVIEW OF SYSTEMS:  Otherwise unremarkable. He did undergo colonoscopy  today, which demonstrated what appeared to be a significant right colon stricture. The stricture appeared to be mucosally based and likely ischemic with subsequent necrosis and stricture rather than malignant. Biopsies are pending. The remainder of the colon was unremarkable.   PHYSICAL EXAMINATION: GENERAL: He is an alert, pleasant gentleman in moderate distress from abdominal discomfort, but otherwise has normal blood pressure 142/68. Heart rate is 62 and regular.  HEENT: Unremarkable. No scleral icterus. No pupillary abnormalities.  NECK: Supple, nontender with a midline trachea and no adenopathy.  CHEST: Clear with no adventitious sounds and normal pulmonary excursion.  CARDIAC: No murmurs or gallops. He seems to be in normal sinus rhythm.  ABDOMEN: Large midline scar and appears to have no significant abdominal findings. He has no rebound, no guarding, very mild right upper quadrant tenderness on deep palpation, but no hernias or masses noted. He does have bilateral hydroceles larger on the right than the left.  EXTREMITIES: Decreased pulses throughout. He has no evidence of severe distal ischemia.  PSYCHIATRIC: Normal orientation, normal affect.   ASSESSMENT AND PLAN: I have independently reviewed his CT scans and his hospital documentation.  The patient has undergone surgery by Dr. Jamal Collin in the past for his vascular disease. The current presentation suggests possible vascular occlusive disease affecting colon with ischemic area. Reviewing his CT scan, he does appear to have significant vascular occlusive disease in his great vessel systems and I cannot identify IMA.   I would recommend for his biopsy reports rule out malignancy, consider a vascular surgery consult for possible intervention of his mesenteric system and referral to Dr. Jamal Collin for consideration of possible surgical intervention. This has been  discussed with the patient and his family. We will be available to assist as necessary.   ____________________________ Micheline Maze, MD rle:cc D: 02/06/2013 17:57:52 ET T: 02/06/2013 18:13:56 ET JOB#: [id number]  cc: Micheline Maze, MD, <Dictator> Richard L. Rosanna Randy, MD Rodena Goldmann MD ELECTRONICALLY SIGNED 02/07/2013 17:29

## 2014-06-07 NOTE — Consult Note (Signed)
History of Present Illness:  Reason for Consult Possible colon cancer.   HPI   Patient is an 81 year old Johnathan [name] who is having intermittent abdominal pain and progressively became worse over the last 3 months.  There were no relieving factors, but states it did get worse with food.  He also reports a 10-12 pound weight loss in the last month.  Currently, he otherwise feels well.  He does not describe any melena or hematochezia.  He has no chest pain or shortness of breath.  He denies any nausea, vomiting, or diarrhea.  He has no urinary complaints.  Patient otherwise feels well and offers no further specific complaints.  PFSH:  Additional Past Medical and Surgical History CAD status post CABG, prostatectomy.  Social history: Distant tobacco use, denies alcohol.  Family history: CAD.   Review of Systems:  Performance Status (ECOG) 0   Review of Systems   As per HPI. Otherwise, 10 point system review was negative.   NURSING NOTES: **Vital Signs.:   22-Dec-14 08:33   Temperature Temperature (F): 97.7   Celsius: 36.5   Temperature Source: oral   Pulse Pulse: 47   Respirations Respirations: 21   Systolic BP Systolic BP: 048   Diastolic BP (mmHg) Diastolic BP (mmHg): 58   Mean BP: 81   Pulse Ox % Pulse Ox %: 100   Pulse Ox Activity Level: At rest   Oxygen Delivery: 2L; Nasal Cannula   Physical Exam:  Physical Exam General: Well-developed, well-nourished, no acute distress. Eyes: Pink conjunctiva, anicteric sclera. HEENT: Normocephalic, moist mucous membranes, clear oropharnyx. Lungs: Clear to auscultation bilaterally. Heart: Regular rate and rhythm. No rubs, murmurs, or gallops. Abdomen: Soft, nontender, nondistended. No organomegaly noted, normoactive bowel sounds. Musculoskeletal: No edema, cyanosis, or clubbing. Neuro: Alert, answering all questions appropriately. Cranial nerves grossly intact. Skin: No rashes or petechiae noted. Psych: Normal  affect. Lymphatics: No cervical, calvicular, axillary or inguinal LAD.    PCN: Rash    aspirin 325 mg oral tablet: 1 tab(s) orally once a day, Status: Active, Quantity: 0, Refills: None   simvastatin 40 mg oral tablet: 1 tab(s) orally once a day (in the morning), Status: Active, Quantity: 0, Refills: None   omeprazole 20 mg oral delayed release capsule: 1 cap(s) orally once a day, Status: Active, Quantity: 0, Refills: None   amLODIPine 5 mg oral tablet: 1 tab(s) orally once a day, Status: Active, Quantity: 0, Refills: None   prednisoLONE acetate ophthalmic acetate 1% ophthalmic suspension: 1 drop(s) to left eye once a day, Status: Active, Quantity: 0, Refills: None   latanoprost ophthalmic 0.005% ophthalmic solution: 1 drop(s) to each affected eye once a day (at bedtime), Status: Active, Quantity: 0, Refills: None   brimonidine ophthalmic 0.2% ophthalmic solution: 1 drop(s) to right twice a day, Status: Active, Quantity: 0, Refills: None  Laboratory Results: Hepatic:  21-Dec-14 20:24   Bilirubin, Total 0.4  Alkaline Phosphatase 65 (45-117 NOTE: New Reference Range 01/05/13)  SGPT (ALT) 17  SGOT (AST) Johnathan Johnathan [name]  Total Protein, Serum 7.0  Albumin, Serum 3.7  Routine Chem:  21-Dec-14 20:24   Glucose, Serum  105  BUN  19  Creatinine (comp)  1.37  Sodium, Serum 140  Potassium, Serum 4.3  Chloride, Serum 107  CO2, Serum 29  Calcium (Total), Serum 8.8  Osmolality (calc) 282  eGFR (African American)  54  eGFR (Non-African American)  47 (eGFR values <59mL/min/1.73 m2 may be an indication of chronic kidney disease (CKD). Calculated eGFR is useful in patients  with stable renal function. The eGFR calculation will not be reliable in acutely ill patients when serum creatinine is changing rapidly. It is not useful in  patients on dialysis. The eGFR calculation may not be applicable to patients at the low and high extremes of body sizes, pregnant women, and vegetarians.)  Anion Gap  4   Lipase 247 (Result(s) reported on 04 Feb 2013 at 09:06PM.)  Cardiac:  21-Dec-14 20:24   Troponin I < 0.02 (0.00-0.05 0.05 ng/mL or less: NEGATIVE  Repeat testing in 3-6 hrs  if clinically indicated. >0.05 ng/mL: POTENTIAL  MYOCARDIAL INJURY. Repeat  testing in 3-6 hrs if  clinically indicated. NOTE: An increase or decrease  of 30% or more on serial  testing suggests a  clinically important change)  Routine Hem:  21-Dec-14 20:24   WBC (CBC) 8.2  RBC (CBC) 4.67  Hemoglobin (CBC) 14.4  Hematocrit (CBC) 43.3  Platelet Count (CBC) 198 (Result(s) reported on 04 Feb 2013 at 08:58PM.)  MCV 93  MCH 30.8  MCHC 33.2  RDW 13.6   Radiology Results: CT:    21-Dec-14 22:12, CT Abdomen and Pelvis With Contrast  CT Abdomen and Pelvis With Contrast   REASON FOR EXAM:    (1) periumbilical pain; (2) same;    NOTE: Nursing to   Give Oral CT Contrast  COMMENTS:   May transport without cardiac monitor    PROCEDURE: CT  - CT ABDOMEN / PELVIS  W  - Feb 04 2013 10:12PM     CLINICAL DATA:  Worsening abdominal pain.    EXAM:  CT ABDOMEN AND PELVIS WITH CONTRAST    TECHNIQUE:  Multidetector CT imaging of the abdomen and pelvis was performed  using the standard protocol following bolus administration of  intravenous contrast.  CONTRAST:  100 mL of Isovue300 IV contrast    COMPARISON:  Abdominal ultrasound performed 12/Johnathan Johnathan [name]/2014    FINDINGS:  The visualized lung bases are clear. Relatively diffuse coronary  artery calcifications are seen.    A small calcified granuloma is noted in the periphery of the liver;  a small nonspecific 0.9 cm hypodensity is noted within the left  hepatic lobe, possibly reflecting a small cyst. Small stones are  seen again seen layering dependently within the gallbladder; the  gallbladder is otherwise unremarkable in appearance. The pancreas  and adrenal glands are unremarkable.  The kidneys are unremarkable in appearance. There is no evidence  of  hydronephrosis. No renal or ureteral stones are seen. No perinephric  stranding is appreciated.    There is partial fecalization of the terminal ileum, with distention  of the terminal ileum, reflecting the cecal and colonic process  described below. The small bowel is otherwise unremarkable in  appearance. The stomach is within normal limits. No acute vascular  abnormalities are seen. Relatively diffuse calcification is noted  along the abdominal aorta and its branches. 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    There is focal wall thickening along the cecum and proximal  ascending colon, measuring approximately 6.8 cm in length, with wall  thickening measuring approximately 1.5 cm. Given the patient's  presentation, this is suspicious for malignancy. Associated soft  tissue stranding and trace paracolic fluid is noted, and there is  fecalization of the terminal ileum and associated distention of the  appendix to 1.1 cm. The appendix is otherwise unremarkable  in  appearance.    Scattered diverticulosis is noted along the distal descending and  proximal sigmoid colon, without evidence of diverticulitis.    The bladder is mildly distended and grossly unremarkable in  appearance. The patient is status post prostatectomy, with  associated clips at the prostate bed. Relatively large bilateral  hydroceles are incidentally noted. No inguinal lymphadenopathy is  seen.    No acute osseous abnormalities are identified. There is mild chronic  loss of height of vertebral body T12.     IMPRESSION:  1. Focal wall thickening along the cecum and proximal ascending  colon, measuring 6.8 cm in length, with wall thickening measuring  1.5 cm. Given the patient's presentation, this is suspicious for  primary colonic malignancy. Associated soft tissue stranding  and  trace paracolic fluid noted, with fecalization and distention of the  terminal ileum, and distention of the appendix to 1.1 cm.  2. Relatively large bilateral hydroceles incidentally noted at the  scrotum.  3. Relatively diffuse coronary artery calcifications seen.  4. Possible small hepatic cyst.  5. Cholelithiasis again noted; gallbladder otherwise unremarkable in  appearance.  6. 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 Relatively diffuse calcification  along the abdominal aorta and its branches.  7. Scattered diverticulosis along the distal descending and proximal  sigmoid colon, without evidence of diverticulitis.      Electronically Signed    By: Garald Balding M.D.    On: 02/04/2013 22:25     Verified By: JEFFREY . CHANG, M.D.,   Assessment and Plan: Impression:   Possible colon cancer. Plan:   1.  Possible colon cancer: Patient has been evaluated by GI and by report plan to do a colonoscopy tomorrow to confirm the results.  Patient will also require a surgical consult in the near future.  Once colonoscopy is complete, can get a staging PET scan.  Will also order CEA today for completeness.  If colon cancer is confirmed, treatment would be difficult given patient's advanced age. consult, will follow.  Electronic Signatures: Delight Hoh (MD)  (Signed 22-Dec-14 14:49)  Authored: HISTORY OF PRESENT ILLNESS, PFSH, ROS, NURSING NOTES, PE, ALLERGIES, HOME MEDICATIONS, LABS, OTHER RESULTS, ASSESSMENT AND PLAN   Last Updated: 22-Dec-14 14:49 by Delight Hoh (MD)

## 2014-06-07 NOTE — Consult Note (Signed)
Brief Consult Note: Diagnosis: Colonic mass. Three month history of abdominal pain.  Unintentional weight loss.  Noted change in bowel habit pattern.  Abnormal GI x-ray finding.   Consult note dictated.   Orders entered.   Discussed with Attending MD.   Comments: Patient's presentation discussed with Dr. Verdie Shire.  Will proceed with diagnostic colonoscopy tomorrow to allow direct luminal evaluation of lower GI tract.  Biopsies to be obtained. Orders placed.  Procedure, risks versus benefits discussed.  CEA level ordered.  Clear liquid diet today and NPO after midnight.  Electronic Signatures: Payton Emerald (NP)  (Signed 22-Dec-14 14:30)  Authored: Brief Consult Note   Last Updated: 22-Dec-14 14:30 by Payton Emerald (NP)

## 2014-06-07 NOTE — Consult Note (Signed)
PATIENT NAME:  Johnathan [name], Johnathan [name] MR#:  [id number] DATE OF BIRTH:  Aug 03, 1925  DATE OF CONSULTATION:  02/05/2013  REFERRING PHYSICIAN:  Dr. Posey Pronto CONSULTING PHYSICIAN:  Verdie Shire, MD / Payton Emerald, NP  PRIMARY CARE PHYSICIAN: Miguel Aschoff, MD  REASON FOR CONSULTATION: Colonic mass.   HISTORY OF PRESENT ILLNESS: Johnathan [name] is an 81 year old Caucasian gentleman who has a significant past medical history of coronary artery disease status post triple bypass. He presented to the Emergency Room for the chief complaint of abdominal pain, umbilically, intermittent for the past 3 months. The patient does state that he had prior evaluation at [facility] in October of this year, which according to his daughter who is present was told that there were unremarkable findings. Eating does exacerbate and cause the pain to be worse. Bowel movement does seem to alleviate the pain. Last bowel movement was today. CT scan of abdomen and pelvis with contrast was done yesterday which revealed focal wall thickening along the cecum and proximal ascending colon measuring 6.8 cm in length with wall thickening measuring 1.5 cm. Associated soft tissue stranding and trace paracolic fluid noted with focalization and distention of the terminal ileum and distention to the appendix of 1.1 cm. Relatively large bilateral hydroceles incidentally noted. Possible small hepatic cyst, cholelithiasis. Scattered diverticulosis along the distal descending and proximal sigmoid colon. He has had no nausea, no vomiting. He has unintentional 12 pound weight loss over the past month. No evidence of rectal bleeding or melena, but he does have a history of glaucoma as well as corneal lens transplant 3 times. He states he has a known history of visual impairment. Known history of reflux. The patient does not feel that it is well controlled at times. He does experience burning sensation epigastrically at times.   Colonoscopy was performed by Dr.  Gaylyn Cheers on 06/08/2004 with one 6 mm polyp from sigmoid colon, one 4 mm polyp from ascending colon, diverticulosis, and internal hemorrhoids noted. His pathological report revealed adenomatous polyps.   PAST MEDICAL AND SURGICAL HISTORY:  1.  Coronary artery disease, status post triple bypass.  2.  GERD.  3.  Peripheral vascular disease, status post a vascular procedure performed by Dr. Jamal Collin. Patient unable to elaborate details.  4.  Glaucoma. 5.  Prostate cancer status post prostatectomy in 2001.  6.  Corneal transplant left eye 3 times.  FAMILY HISTORY: Significant for coronary artery disease involving his father. Mother cirrhosis of the liver, nonalcoholic related. No history of colon cancer or other forms of neoplasm. Family history of diverticulosis.   SOCIAL HISTORY: Remote tobacco use. No alcohol.   HOME MEDICATIONS: 1.  Aspirin 325 mg once a day. 2.  Amlodipine 5 mg daily. 3.  Brimonidine ophthalmic 0.2% solution 1 drop twice daily. 4.  Latanoprost ophthalmic 0.005% one drop to affected eye once a day.  5.  Omeprazole 20 mg once a day.  6.  Prednisolone acetate ophthalmic 1% ophthalmic solution 1 drop left eye once a day. 7.  Simvastatin 40 mg once a day.   ALLERGIES: PENICILLIN.   REVIEW OF SYSTEMS: All 10 systems reviewed and checked, otherwise unremarkable unless stated above.   PHYSICAL EXAMINATION: VITAL SIGNS: Temperature is 97.7 with a pulse of 47, respirations 21, blood pressure 127/58, and pulse ox 100%.  GENERAL: Well-developed, well-nourished 81 year old Caucasian gentleman, in no acute distress noted, pleasant.  HEENT: Normocephalic, atraumatic. Pupils equal and reactive to light. Conjunctivae clear. Sclerae anicteric.  NECK: Supple. Trachea midline.  No lymphadenopathy or thyromegaly.  PULMONARY: Symmetric rise and fall of chest. Clear to auscultation throughout.  CARDIOVASCULAR: Regular rhythm, S1 and S2. No murmurs. No gallops.  ABDOMEN: Soft, slightly  distended. Hypoactive bowel sounds. No bruits. No masses. Mild prominence noted, right lower quadrant.  RECTAL: Deferred.  MUSCULOSKELETAL: Movement of all 4 extremities. No contractures. No clubbing.  EXTREMITIES: No edema.  PSYCHIATRIC: Alert and oriented x4. Memory grossly intact. Appropriate affect and mood.   LABORATORY AND DIAGNOSTICS: CT scan of abdomen and pelvis with contrast: Results as noted under history.   CBC: Within normal limits. Hepatic panel within normal limits. Chemistry panel: Glucose is 105, BUN 19, and creatinine 1.37. EGFR 47. Anion gap is low at 4. Troponin less than 0.02. Urinalysis within normal limits.   IMPRESSION: Three month history of abdominal pain with abnormal gastrointestinal x-ray results. Findings concerning for mass involving ascending cecum. CT scan of abdomen and pelvis revealed evidence of focal wall thickening along this area measuring 6.8 cm in length. Noted change in bowel habits over the past 3 months with associated unintentional weight loss.   PLAN: The patient's presentation was discussed with Dr. Verdie Shire. Recommendation is to proceed forward with diagnostic colonoscopy tomorrow to allow direct luminal evaluation of colon and biopsies to be obtained. This was discussed with the patient. Procedure and risks versus benefits discussed. The patient willing to proceed forward in this manner. The patient started on a clear liquid diet this evening with n.p.o. status after midnight. Order placed for colonoscopy as well as to be prepped with [facility]. We will continue to monitor and follow during his hospitalization. Pending surgical evaluation. The patient has been seen by oncology, according to his daughter. Pending CEA level.   These services provided by Payton Emerald, MS, APRN, [facility], FNP under collaborative agreement with Verdie Shire, MD. ____________________________ Payton Emerald, NP dsh:sb D: 02/05/2013 14:24:41 ET T: 02/05/2013 15:11:43  ET JOB#: [id number]  cc: Payton Emerald, NP, <Dictator> Payton Emerald MD ELECTRONICALLY SIGNED 02/06/2013 14:46

## 2014-06-07 NOTE — Consult Note (Signed)
Chief Complaint:  Subjective/Chief Complaint No specific GI complaints. Eating ok. No abd pain or diarrhea or bleeding.   VITAL SIGNS/ANCILLARY NOTES: **Vital Signs.:   24-Dec-14 08:55  Vital Signs Type Pre Medication  Temperature Temperature (F) 97.6  Celsius 36.4  Pulse Pulse 57  Respirations Respirations 20  Systolic BP Systolic BP 557  Diastolic BP (mmHg) Diastolic BP (mmHg) 70  Mean BP 111  Pulse Ox % Pulse Ox % 96  Pulse Ox Activity Level  At rest  Oxygen Delivery Room Air/ 21 %   Brief Assessment:  GEN no acute distress   Cardiac Regular   Respiratory clear BS   Gastrointestinal Normal   Lab Results: Routine Chem:  23-Dec-14 05:41   Result Comment PLATELET - FIBRIN STRANDS SEEN ON SMEAR. THIS MAY  - AFFECT THE PLATELET COUNT. THE ACTUAL  - NUMERICAL COUNT MAY BE SOMEWHAT HIGHER  - THAN THE REPORTED VALUE.  Result(s) reported on 06 Feb 2013 at 07:54AM.  Glucose, Serum  106  BUN 12  Creatinine (comp) 1.14  Sodium, Serum 143  Potassium, Serum 4.4  Chloride, Serum  111  CO2, Serum 26  Calcium (Total), Serum  8.3  Anion Gap  6  Osmolality (calc) 285  eGFR (African American) >60  eGFR (Non-African American)  58 (eGFR values <17mL/min/1.73 m2 may be an indication of chronic kidney disease (CKD). Calculated eGFR is useful in patients with stable renal function. The eGFR calculation will not be reliable in acutely ill patients when serum creatinine is changing rapidly. It is not useful in  patients on dialysis. The eGFR calculation may not be applicable to patients at the low and high extremes of body sizes, pregnant women, and vegetarians.)  Routine Hem:  23-Dec-14 05:41   WBC (CBC) 6.3  RBC (CBC)  4.22  Hemoglobin (CBC) 13.2  Hematocrit (CBC)  39.2  Platelet Count (CBC) 163  MCV 93  MCH 31.2  MCHC 33.7  RDW 13.3  Segmented Neutrophils 63  Lymphocytes 30  Monocytes 7  Diff Comment 1 RBCs APPEAR NORMAL  Diff Comment 2 PLTS VARIED IN SIZE   Result(s) reported on 06 Feb 2013 at 07:54AM.   Assessment/Plan:  Assessment/Plan:  Assessment Ischemic colitis vs malignancy.   Plan Await bx. Further evealuations can be done as outpt if patient wants to go home for the holiday. Surgery will depend on final bx. Will sign off for now but call back if questions arise.   Electronic Signatures: Verdie Shire (MD)  (Signed 24-Dec-14 09:28)  Authored: Chief Complaint, VITAL SIGNS/ANCILLARY NOTES, Brief Assessment, Lab Results, Assessment/Plan   Last Updated: 24-Dec-14 09:28 by Verdie Shire (MD)

## 2014-06-08 NOTE — Discharge Summary (Signed)
PATIENT NAME:  Johnathan [name], Johnathan [name] MR#:  [id number] DATE OF BIRTH:  [birth year]  DATE OF ADMISSION:  02/04/2013 DATE OF DISCHARGE:  02/07/2013  ADMITTING DIAGNOSIS: Abdominal pain.   DISCHARGE DIAGNOSES: 1.  Abdominal pain felt to be due to a colonic stricture status post colonoscopy with biopsies, now abdominal pain resolved.  2.  Coronary artery disease.  3.  Mild acute kidney injury, resolved with IV fluids.  4.  Peripheral vascular disease.  5.  History of high grade stenosis on the left status post left carotid endarterectomy.   CONSULTANTS: Dr. Candace Cruise and Dr. Pat Patrick.  PROCEDURES: Colonoscopy results show scar/stricture mucosa in the ascending colon, diverticulosis in the sigmoid colon, as well as stricture noted.   PERTINENT LABS AND EVALUATIONS: Admitting glucose 105, BUN 19, creatinine 1.37, sodium 140, potassium 4.3, chloride 107, CO2 29. Lipase 247. LFTs are normal. Troponin less than 0.02. WBC 8.2, hemoglobin 14.4, platelet count 198. Urinalysis was negative. Pathology report shows chronic ulceration noted. No mucosa was present in these samples. No parasites or granulomas seen. No evidence of neoplasm.   HOSPITAL COURSE: Please refer to the H and P done by the admitting physician. The patient is an 81 year old who presented to the hospital with abdominal pain. He has a history of previous coronary artery disease status post CABG who was having abdominal pain for about 3 months. It got worse. Therefore, he came to the ED. The patient in the ED had a CT scan of the abdomen which showed focal wall thickening along the cecum measuring 6.8 cm suspicious for primary colonic malignancy. The patient was admitted and further evaluation was pursued. He was seen by GI and underwent a colonoscopy. The colonoscopy showed a stricture. He was seen by surgery. They felt that the stricture was likely related to possible chronic ongoing ischemia, with mesenteric ischemia. They did not feel he needed any surgery. Post  colonoscopy the patient was started back on a diet and he is doing much better. He is not having any further abdominal pain. He will follow up with his primary surgeon, Dr. Jamal Collin, to evaluate for  chronic mesenteric ischemia. At this time, he is stable for discharge.   DISCHARGE ACTIVITY: As tolerated.   DISCHARGE MEDICATIONS:  1.  Aspirin 325 daily. 2.  Simvastatin 40 at bedtime. 3.  Omeprazole 20 daily. 4.  Amlodipine 5 daily. 5.  Prednisone acetate 1 drop left eye each day, as doing previously. 6.  Latanoprost ophthalmic 1 drop to each affected eye at bedtime. 7.  Brimonidine 1 drop to right eye twice daily. 8.  Tylenol 650 q. 4 p.r.n. 9.  MiraLax 17 grams daily.   DIET: Low-sodium, low-fat, low-cholesterol.   ACTIVITY: As tolerated.   DISCHARGE FOLLOWUP: With Dr. Jamal Collin next week for possible mesenteric ischemia. Follow up with Dr. Candace Cruise in 2 to 4 weeks. Follow up with primary MD in 1 to 2 weeks.  TIME SPENT ON DISCHARGE: 35 minutes.  ____________________________ Lafonda Mosses Posey Pronto, MD shp:sb D: 02/09/2013 08:35:27 ET T: 02/09/2013 08:44:50 ET JOB#: [id number]  cc: Dream Harman H. Posey Pronto, MD, <Dictator> Alric Seton MD ELECTRONICALLY SIGNED 02/18/2013 8:30

## 2014-06-08 NOTE — Op Note (Signed)
PATIENT NAME:  Johnathan [name], Johnathan [name] MR#:  [id number] DATE OF BIRTH:  1925/07/02  DATE OF PROCEDURE:  04/10/2013  PREOPERATIVE DIAGNOSES: 1.  Malignant hypertension.  2.  Left renal artery stenosis.  3.  Atherosclerotic occlusive disease, bilateral lower extremities.  4.  Abdominal aortic aneurysm.  5.  Chronic mesenteric ischemia status post placement of stent in the superior mesenteric artery.   POSTOPERATIVE DIAGNOSES:   1.  Malignant hypertension.  2.  Left renal artery stenosis.  3.  Atherosclerotic occlusive disease, bilateral lower extremities.  4.  Abdominal aortic aneurysm.  5.  Chronic mesenteric ischemia status post placement of stent in the superior mesenteric artery.   PROCEDURES PERFORMED: 1.  Abdominal aortogram.  2.  Selective injection, left renal artery.  3.  Percutaneous transluminal angioplasty and stent placement using a 6 x 18 Herculink stent postdilated to 7 mm distally and 8 mm proximally.   SURGEON: Katha Cabal, M.D.   SEDATION: Versed 4 mg plus fentanyl 200 mcg administered IV. Continuous ECG, pulse oximetry and cardiopulmonary monitoring was performed throughout the entire procedure by the interventional radiology nurse. Total sedation time was 50 minutes.   ACCESS: A 6-French sheath, right common femoral artery.   CONTRAST USED: Isovue 30 mL.   FLUOROSCOPY TIME: Nine minutes.   INDICATIONS: Mr. Hollar is an 81 year old gentleman who recently underwent stenting of his superior mesenteric artery. He at that time was found to have a string sign in his left renal artery. Blood pressures throughout the case were in excess of 045 systolic and preparations were made for intervention of the left renal artery. The risks and benefits have been reviewed with the patient as an outpatient in the office. All questions have been answered. The patient agrees to proceed.   DESCRIPTION OF PROCEDURE: The patient is taken to special procedures and placed in the supine position.  After adequate sedation is achieved, both groins are prepped and draped in sterile fashion. Ultrasound is placed in a sterile sleeve. Ultrasound is utilized secondary to lack of appropriate landmarks and to avoid vascular injury. Under direct ultrasound visualization, common femoral artery is identified. It is echolucent and pulsatile indicating patency. Image is recorded for the permanent record. Micropuncture needle is used to access the common femoral artery under real-time visualization. Microwire followed by micro sheath. J-wire would not advance past the proximal external iliac and therefore a floppy Glidewire was advanced without difficulty. A 5-French sheath and 5-French pigtail catheter are then placed and the pigtail catheter is positioned at the level of the existing SMA stent, which is fairly easy to visualize. Magnified image of the aorta and left flank are obtained and a bolus injection of contrast is used to perform imaging. Left renal artery is localized, high-grade stricture is again confirmed, and 4000 units of heparin is given. The pigtail catheter is removed over the Glidewire as is the 5-French sheath and a 6-French Ansell sheath is advanced and subsequently a VS-2 catheter. The VS-2 catheter and wire are used to engage the left renal and once this is achieved the catheter is backed out of the renal, the sheath is advanced up essentially to the ostium.   Guardwire is then prepared on the field and once balloon inflation has been verified the guardwire is advanced through the VS-2 catheter in exchange for the floppy glide. Once the wire is in the renal artery, magnified image is obtained with hand injection through the sheath. The balloon is then positioned appropriately, inflated, and the VS-2  catheter is removed. A 6 x 18 Herculink stent is advanced up across the lesion and inflated. It is somewhat undersized and a 7 x 2 balloon is then used to post dilate the renal artery. This is still  somewhat undersized at the aortic orifice. The guardwire is taken down and exchanged through a straight glide catheter for a Magic torque wire and an 8 x 2 balloon is inflated to 10 atmospheres for approximately 20 to 30 seconds flaring the origin of the stent. The sheath is then positioned so that it is just crossed into the stent and final imaging is obtained by hand injection. After review of the images, the sheath and wire are removed, oblique view of the right groin is obtained, and a StarClose device is deployed successfully without any difficulty. There are no immediate complications.   INTERPRETATION: Initially there is a string sign in the left renal artery. Renal artery demonstrates a moderate poststenotic dilatation. Measurement with the computer approximates to 6.3 diameter and the 6 x 18 stent is selected. Guardwire is used to prevent distal embolization. After initial deployment of the stent, it appears somewhat undersized and the stent is re-angioplastied with a 7 x 2 balloon. The stent is still approximately 3 to 4 mm into the aorta, slightly more than would be desired, and therefore the 8 x 2 balloon is used to flare the proximal portion of the stent. Final imaging demonstrates that the stent is now extending into the aorta by only 2 mm. There is excellent apposition of the stent distally to the renal artery and there is wide patency of the stent through the previously stenotic segment. Distal nephrogram is preserved with no evidence of change or embolization.   SUMMARY: Successful angioplasty and stent placement of the left renal artery, as described above.  ____________________________ Katha Cabal, MD ggs:sb D: 04/10/2013 15:00:41 ET T: 04/10/2013 15:17:02 ET JOB#: [id number]  cc: Katha Cabal, MD, <Dictator> Mckinley Jewel, MD Richard L. Rosanna Randy, [place] MD ELECTRONICALLY SIGNED 04/18/2013 11:02

## 2014-06-08 NOTE — Op Note (Signed)
PATIENT NAME:  Johnathan [name], Johnathan [name] MR#:  [id number] DATE OF BIRTH:  12-05-1925  DATE OF PROCEDURE:  03/06/2013  PREOPERATIVE DIAGNOSES: 1.  Mesenteric ischemia.  2.  Ischemic stricture, right colon.  3.  Stricture stenosis, visceral vessels.  4. Atherosclerotic occlusive disease bilateral lower extremities, status post multiple interventions and surgeries.   POSTOPERATIVE DIAGNOSES: 1.  Mesenteric ischemia.  2.  Ischemic stricture, right colon.  3.  Stricture stenosis, visceral vessels.  4. Atherosclerotic occlusive disease bilateral lower extremities, status post multiple interventions and surgeries.   PROCEDURE PERFORMED: 1.  Abdominal aortogram.  2.  Selective injection of the superior mesenteric artery.  3.  Percutaneous transluminal angioplasty and stent placement of the superior mesenteric artery to 7 mm.  PROCEDURE PERFORMED BY: Katha Cabal, M.D.   SEDATION: Versed 4 mg plus fentanyl 150 mcg administered IV. Continuous ECG, pulse oximetry, and cardiopulmonary monitoring was performed throughout the entire procedure by the interventional radiology nurse. Total sedation time was 1 hour.   ACCESS:  A 6 French sheath, right common femoral artery.   FLUOROSCOPY TIME:  12.5 minutes.   CONTRAST USED:  Isovue 85 mL  INDICATION: Johnathan [name] is an 81 year old gentleman who presented earlier this month with abdominal pain, and was found to have a stricture. Biopsies of the colonic stricture demonstrated ischemic changes. He has therefore been worked up for proximal atherosclerotic occlusive disease, and found to have stricture stenosis of the superior mesenteric artery. The risks and benefits for angiography and intervention were reviewed. All questions are answered. The patient has agreed to proceed.   DESCRIPTION OF PROCEDURE: The patient is taken to special procedures, placed in the supine position. After adequate sedation is achieved, both groins are prepped and draped in a sterile  fashion. Ultrasound is placed in a sterile sleeve. Ultrasound is utilized secondary to lack of appropriate landmarks and to avoid vascular injury. Under direct visualization, the common femoral artery is identified. It is echolucent and pulsatile, indicating patency. Image is recorded for the permanent record. Micropuncture needle is then utilized under direct visualization after 1% lidocaine has been infiltrated, and access to the common femoral artery is obtained with a micro system. A J-wire is then advanced, followed by a 5 [place] sheath and 5 French pigtail catheter.   AP projection of the aorta with the pigtail catheter positioned at the level of T10 is then obtained, and the origins of the visceral vessels localized. Detector is then placed in the lateral position, and lateral imaging of the aorta is obtained. Image demonstrates stricture stenosis of the celiac, and a critical stenosis of the SMA.   Next, 4000 units of heparin is given. A stiff angled Glidewire is then advanced through the pigtail catheter. The pigtail catheter and 5 French sheath are removed, and a 6 [place] Ansel high-flex sheath is advanced into the mid infrarenal aorta. A VS-1 catheter is then advanced over the wire, re-formed in the distal descending aorta, and subsequently used to engage the SMA with the stiff angled Glidewire. With the wire catheter hooked into the SMA, the Ansel high-flex sheath is advanced so that the tip of the sheath is at the origin of the superior mesenteric artery. The VS1 catheter is removed, and a straight glide catheter is advanced over the wire. Subsequently, the Glidewire is exchanged for a Magic torque wire. With the high flex sheath at the origin of the SMA, magnified images are then obtained by hand injection. A 7 x 19 Omnilink stent is selected,  advanced through the sheath and positioned over the origin, extending approximately 1 to 2 mm into the aorta. The stent is then deployed with a single  inflation to 12 atmospheres for 30 seconds. Followup angiography demonstrates wide patency of the stent, with resolution of the stricture, and selective imaging through a catheter in the mesenteric artery demonstrates patency distally. Catheter and sheath are then pulled into the external, and an oblique view is obtained. StarClose device is deployed without difficulty. There are no immediate complications.   INTERPRETATION:  Initial views of the aorta and lateral projection demonstrate approximately a 50% to 60% stenosis of the celiac, and a greater than 90% stenosis at the origin of the SMA. Distally, the SMA appears patent. Following angioplasty and stent placement with a 7 mm diameter Omnilink stent, there is now wide patency of the SMA. The stent matches the size of the proximal SMA quite nicely, and distal runoff is preserved.   Also of note, on the AP projection of the aorta is single renal arteries bilaterally, with equally and normal-sized nephrograms. However, the left renal artery demonstrates a greater than 85% stenosis at its origin. Right renal artery is widely patent.   SUMMARY: 1.  Successful revascularization of the superior mesenteric artery with a 7 x 19 Omnilink stent.  2.  Critical stenosis of the left renal artery.    ____________________________ Katha Cabal, MD ggs:mr D: 03/06/2013 17:22:34 ET T: 03/06/2013 19:00:47 ET JOB#: [id number]  cc: Katha Cabal, MD, <Dictator> Mckinley Jewel, MD Richard L. Rosanna Randy, [place] MD ELECTRONICALLY SIGNED 03/28/2013 13:24

## 2014-06-24 LAB — HEPATIC FUNCTION PANEL
ALT: 11 U/L (ref 10–40)
AST: 16 U/L (ref 14–40)
Alkaline Phosphatase: 68 U/L (ref 25–125)

## 2014-06-24 LAB — LIPID PANEL
Cholesterol: 145 mg/dL (ref 0–200)
HDL: 37 mg/dL (ref 35–70)
LDL Cholesterol: 84 mg/dL
LDl/HDL Ratio: 2.3
Triglycerides: 118 mg/dL (ref 40–160)

## 2014-06-24 LAB — BASIC METABOLIC PANEL
BUN: 19 mg/dL (ref 4–21)
Creatinine: 1.4 mg/dL — AB (ref <=1.3)
Glucose: 106 mg/dL
Potassium: 4.9 mmol/L (ref 3.4–5.3)
Sodium: 142 mmol/L (ref 137–147)

## 2014-06-24 LAB — CBC AND DIFFERENTIAL
HCT: 43 % (ref 41–53)
Hemoglobin: 14.7 g/dL (ref 13.5–17.5)
Neutrophils Absolute: 51 /uL
Platelets: 195 10*3/uL (ref 150–399)
WBC: 5.4 10^3/mL

## 2014-06-24 LAB — TSH: TSH: 2.01 u[IU]/mL (ref <=5.90)

## 2014-06-24 LAB — HEMOGLOBIN A1C: Hgb A1c MFr Bld: 5.5 % (ref 4.0–6.0)

## 2014-08-05 DIAGNOSIS — H4050X0 Glaucoma secondary to other eye disorders, unspecified eye, stage unspecified: Secondary | ICD-10-CM | POA: Insufficient documentation

## 2014-08-05 DIAGNOSIS — I714 Abdominal aortic aneurysm, without rupture, unspecified: Secondary | ICD-10-CM

## 2014-08-05 DIAGNOSIS — M1990 Unspecified osteoarthritis, unspecified site: Secondary | ICD-10-CM | POA: Insufficient documentation

## 2014-08-05 DIAGNOSIS — Z961 Presence of intraocular lens: Secondary | ICD-10-CM | POA: Insufficient documentation

## 2014-08-05 DIAGNOSIS — H35379 Puckering of macula, unspecified eye: Secondary | ICD-10-CM | POA: Insufficient documentation

## 2014-08-05 DIAGNOSIS — M1710 Unilateral primary osteoarthritis, unspecified knee: Secondary | ICD-10-CM | POA: Insufficient documentation

## 2014-08-05 DIAGNOSIS — M179 Osteoarthritis of knee, unspecified: Secondary | ICD-10-CM | POA: Insufficient documentation

## 2014-08-05 DIAGNOSIS — E669 Obesity, unspecified: Secondary | ICD-10-CM

## 2014-08-05 DIAGNOSIS — I2510 Atherosclerotic heart disease of native coronary artery without angina pectoris: Secondary | ICD-10-CM

## 2014-08-05 DIAGNOSIS — E7800 Pure hypercholesterolemia, unspecified: Secondary | ICD-10-CM | POA: Insufficient documentation

## 2014-08-05 DIAGNOSIS — G4700 Insomnia, unspecified: Secondary | ICD-10-CM

## 2014-08-05 DIAGNOSIS — I255 Ischemic cardiomyopathy: Secondary | ICD-10-CM

## 2014-08-05 DIAGNOSIS — H348392 Tributary (branch) retinal vein occlusion, unspecified eye, stable: Secondary | ICD-10-CM | POA: Insufficient documentation

## 2014-08-05 DIAGNOSIS — G629 Polyneuropathy, unspecified: Secondary | ICD-10-CM

## 2014-08-05 DIAGNOSIS — H3530 Unspecified macular degeneration: Secondary | ICD-10-CM | POA: Insufficient documentation

## 2014-08-05 DIAGNOSIS — R739 Hyperglycemia, unspecified: Secondary | ICD-10-CM

## 2014-08-05 DIAGNOSIS — M158 Other polyosteoarthritis: Secondary | ICD-10-CM

## 2014-08-05 DIAGNOSIS — K219 Gastro-esophageal reflux disease without esophagitis: Secondary | ICD-10-CM | POA: Insufficient documentation

## 2014-08-05 DIAGNOSIS — C61 Malignant neoplasm of prostate: Secondary | ICD-10-CM

## 2014-08-06 DIAGNOSIS — I714 Abdominal aortic aneurysm, without rupture, unspecified: Secondary | ICD-10-CM | POA: Insufficient documentation

## 2014-08-06 DIAGNOSIS — G629 Polyneuropathy, unspecified: Secondary | ICD-10-CM | POA: Insufficient documentation

## 2014-08-06 DIAGNOSIS — J449 Chronic obstructive pulmonary disease, unspecified: Secondary | ICD-10-CM | POA: Insufficient documentation

## 2014-08-06 DIAGNOSIS — R739 Hyperglycemia, unspecified: Secondary | ICD-10-CM | POA: Insufficient documentation

## 2014-08-06 DIAGNOSIS — G4700 Insomnia, unspecified: Secondary | ICD-10-CM | POA: Insufficient documentation

## 2014-08-06 DIAGNOSIS — E669 Obesity, unspecified: Secondary | ICD-10-CM | POA: Insufficient documentation

## 2014-08-06 DIAGNOSIS — I2510 Atherosclerotic heart disease of native coronary artery without angina pectoris: Secondary | ICD-10-CM | POA: Insufficient documentation

## 2014-08-06 DIAGNOSIS — C61 Malignant neoplasm of prostate: Secondary | ICD-10-CM | POA: Insufficient documentation

## 2014-08-06 DIAGNOSIS — I255 Ischemic cardiomyopathy: Secondary | ICD-10-CM | POA: Insufficient documentation

## 2014-08-07 ENCOUNTER — Encounter: Payer: Self-pay | Admitting: Family Medicine

## 2014-08-07 ENCOUNTER — Ambulatory Visit
Admission: RE | Admit: 2014-08-07 | Discharge: 2014-08-07 | Disposition: A | Discharge status: Home / Self Care | Payer: Medicare Other | Source: Ambulatory Visit | Attending: Family Medicine | Admitting: Family Medicine

## 2014-08-07 ENCOUNTER — Ambulatory Visit (INDEPENDENT_AMBULATORY_CARE_PROVIDER_SITE_OTHER): Payer: Medicare Other | Admitting: Family Medicine

## 2014-08-07 VITALS — BP 136/90 | HR 56 | Temp 98.7°F | Resp 16 | Ht 68.0 in | Wt 216.0 lb

## 2014-08-07 DIAGNOSIS — M25552 Pain in left hip: Secondary | ICD-10-CM

## 2014-08-07 DIAGNOSIS — N433 Hydrocele, unspecified: Secondary | ICD-10-CM

## 2014-08-07 DIAGNOSIS — I739 Peripheral vascular disease, unspecified: Secondary | ICD-10-CM | POA: Insufficient documentation

## 2014-08-07 NOTE — Progress Notes (Signed)
Subjective:    Patient ID: Johnathan [name], male    DOB: 28-Jun-1925, 81 y.o.   MRN: [id number]  Hip Pain  There was no injury mechanism. The pain is present in the left hip. The quality of the pain is described as aching. The pain is at a severity of 0/10. The patient is experiencing no pain. The pain has been intermittent since onset. Associated symptoms include an inability to bear weight. Pertinent negatives include no loss of motion, loss of sensation, muscle weakness, numbness or tingling. He reports no foreign bodies present. The symptoms are aggravated by movement. He has tried NSAIDs for the symptoms. The treatment provided mild relief.   he has no GI or GU symptoms other than pain/tenderness in his left groin and testicle. The left testicle has been chronically swollen but it seems to be worse lately. No cardiac or pulmonary symptoms at all.    Review of Systems  Constitutional: Negative.   HENT: Negative.   Eyes: Positive for visual disturbance.  Respiratory: Negative.   Cardiovascular: Negative.   Gastrointestinal: Negative.   Genitourinary: Positive for scrotal swelling and testicular pain.  Musculoskeletal: Positive for arthralgias.  Skin: Negative.   Allergic/Immunologic: Negative.   Neurological: Negative for tingling and numbness.  Hematological: Negative.   Psychiatric/Behavioral: Negative.    Patient Active Problem List   Diagnosis Date Noted  . ASCVD (arteriosclerotic cardiovascular disease) 08/06/2014  . Cardiomyopathy, ischemic 08/06/2014  . Prostate cancer 08/06/2014  . Obesity 08/06/2014  . Insomnia 08/06/2014  . Neuropathy 08/06/2014  . Abdominal aortic aneurysm 08/06/2014  . COPD (chronic obstructive pulmonary disease) 08/06/2014  . Hyperglycemia 08/06/2014  . Acid reflux 08/05/2014  . Age-related macular degeneration 08/05/2014  . Branch retinal vein occlusion 08/05/2014  . 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 Hypercholesteremia 08/05/2014  .  Pseudoaphakia 08/05/2014  . Secondary glaucoma 08/05/2014  . Osteoarthritis 08/05/2014  . Carotid stenosis 04/02/2014  . PAD (peripheral artery disease) 04/02/2014  . 3-vessel CAD 11/06/2013  . Colonic stricture 02/21/2013  . BP (high blood pressure) 11/22/2012  . Kidney failure 11/21/2012  . 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 Hypertension   . COPD (chronic obstructive pulmonary disease)   . Hyperlipidemia   . Occlusion and stenosis of carotid artery without mention of cerebral infarction   . Atherosclerosis of native arteries of the extremities, unspecified   . Prostate pain   . GERD (gastroesophageal reflux disease)    Current Outpatient Prescriptions on File Prior to Visit  Medication Sig  . amLODipine (NORVASC) 5 MG tablet Take 5 mg by mouth daily.   [name] aspirin 325 MG EC tablet Take 325 mg by mouth daily.  . brimonidine (ALPHAGAN) 0.2 % ophthalmic solution Place 1 drop into both eyes 3 (three) times daily.  [name] latanoprost (XALATAN) 0.005 % ophthalmic solution Place 1 drop into both eyes at bedtime.   [name] NEXIUM 20 MG capsule Take 20 mg by mouth 2 (two) times daily before a meal.   . Polyethylene Glycol 3350 (MIRALAX PO) Take by mouth as needed.  . prednisoLONE acetate (PRED FORTE) 1 % ophthalmic suspension Place 1 drop into both eyes 4 (four) times daily.  . simvastatin (ZOCOR) 20 MG tablet Take 20 mg by mouth daily at 6 PM.    No current facility-administered medications on file prior to visit.   Allergies  Allergen Reactions  . Sulfa Antibiotics Other (See Comments)  . Penicillins Rash   Past Surgical History  Procedure Laterality Date  . Carotid endarterectomy  2003  . Coronary artery bypass graft  2000  . Hernia repair  1992  . Aortoiliac bypass   1986  . Colonoscopy  02-06-13    Dr Candace Cruise  . Prostate surgery  2001  . Cataract extraction     History   Social History  . Marital Status: Widowed    Spouse Name: N/A  .  Number of Children: 4  . Years of Education: N/A   Occupational History  . retired    Social History Main Topics  . Smoking status: Former Smoker -- 1.00 packs/day for 30 years    Quit date: 02/16/2003  . Smokeless tobacco: Never Used  . Alcohol Use: No  . Drug Use: No  . Sexual Activity: Not on file   Other Topics Concern  . Not on file   Social History Narrative   Family History  Problem Relation Age of Onset  . Cirrhosis Mother     liver  . Heart attack Father         Objective:   Physical Exam  Constitutional: He is oriented to person, place, and time. He appears well-developed and well-nourished.  HENT:  Head: Normocephalic and atraumatic.  Right Ear: External ear normal.  Left Ear: External ear normal.  Nose: Nose normal.  Eyes: Conjunctivae are normal. Pupils are equal, round, and reactive to light.  Neck: Normal range of motion.  Cardiovascular: Normal rate, regular rhythm, normal heart sounds and intact distal pulses.   Pulmonary/Chest: Effort normal and breath sounds normal.  Abdominal: Soft. Bowel sounds are normal.  Genitourinary: Left testis shows mass (apears to be large hydrocele) and swelling.  No hernias noted. There does appear to be a large left hydrocele present.  Musculoskeletal: He exhibits tenderness (mild tenderness).  There is some mild discomfort with figure four exam on the left  Neurological: He is alert and oriented to person, place, and time. No cranial nerve deficit.    Blood pressure 136/90, pulse 56, temperature 98.7 F (37.1 C), temperature source Oral, resp. rate 16, height 5\' 8"  (1.727 m), weight 216 lb (97.977 kg), SpO2 96 %.       Assessment & Plan:   1. Hydrocele of testis New problem. Patient referred to Urology for further work up. - Ambulatory referral to Urology  2. Left hip pain New problem. F/U pending x-ray report. Patient advised to call if symptoms continue or worsen. Patient verbalizes understanding and is  in agreement with treatment plan. - DG Arthro Hip Left; Future Likely will need referral to orthopedics.  3. CAD All risk factors treated.  4 COPD  5. Patient legally  blind   6 Hypertension  7. Hyperlipidemia   Patient seen and examined by Dr. Miguel Aschoff, and note scribed by Philbert Riser. Dimas, CMA.

## 2014-08-08 ENCOUNTER — Telehealth: Payer: Self-pay

## 2014-08-08 DIAGNOSIS — M25552 Pain in left hip: Secondary | ICD-10-CM

## 2014-08-08 NOTE — Telephone Encounter (Signed)
Johnathan [name]'s talk about his back pain. I just think this is arthritis and possible lumbosacral radiculopathy. Would not refer to neurology. Would actually refer to orthopedic surgery first.

## 2014-08-08 NOTE — Telephone Encounter (Signed)
Refer to urology for hydrocele

## 2014-08-08 NOTE — Telephone Encounter (Signed)
So does that mean no neurology referral? ED

## 2014-08-08 NOTE — Telephone Encounter (Signed)
Called but no answer and unable to leave a message.-aa

## 2014-08-08 NOTE — Telephone Encounter (Signed)
Patient asked if you were going to send him to a neurologist? Johnathan [name]

## 2014-08-08 NOTE — Telephone Encounter (Signed)
-----   Message from Jerrol Banana., MD sent at 08/07/2014  3:54 PM EDT ----- Arthritis of back and hips noted.

## 2014-08-08 NOTE — Telephone Encounter (Signed)
So no neurology

## 2014-08-12 ENCOUNTER — Telehealth: Payer: Self-pay | Admitting: Family Medicine

## 2014-08-12 NOTE — Telephone Encounter (Signed)
Pt was returning Ana's call. Thanks TNP

## 2014-08-12 NOTE — Telephone Encounter (Signed)
Pt advised appt with ortho is been worked on.-aa

## 2014-08-12 NOTE — Telephone Encounter (Signed)
Patient advised, sent referral notification to Judson Roch to work on this-aa

## 2014-08-12 NOTE — Addendum Note (Signed)
Addended by: Arnette Norris on: 08/12/2014 08:47 AM   Modules accepted: Orders

## 2014-08-22 ENCOUNTER — Encounter: Payer: Self-pay | Admitting: Urology

## 2014-08-22 ENCOUNTER — Ambulatory Visit (INDEPENDENT_AMBULATORY_CARE_PROVIDER_SITE_OTHER): Payer: Medicare Other | Admitting: Urology

## 2014-08-22 VITALS — BP 176/68 | HR 60 | Resp 18 | Ht 68.0 in | Wt 211.7 lb

## 2014-08-22 DIAGNOSIS — N433 Hydrocele, unspecified: Secondary | ICD-10-CM | POA: Diagnosis not present

## 2014-08-22 DIAGNOSIS — Z8546 Personal history of malignant neoplasm of prostate: Secondary | ICD-10-CM | POA: Diagnosis not present

## 2014-08-22 NOTE — Progress Notes (Signed)
08/22/2014 11:10 PM   Johnathan [name] [birth year] [id number]  Referring provider: Jerrol Banana., MD [address] [place], [place] [address]  Chief Complaint  Patient presents with  . Hydrocele  . Establish Care    HPI: Johnathan [name] an 81 year old white male who presents today at the request of his primary care physician, Dr. Rosanna Randy for hydroceles.   Patient states the swelling has been going on for years. He is not having pain in  his testicles, they are just getting bigger.  He denies any trauma to the scrotal area or infections. His baseline urinary symptoms are getting up at night to urinate.   He does have a history of prostate cancer and underwent a radical prostatectomy in 2000 at [facility]. He does not remember the name of the physician.   He is having some lower back pain and he has not had a recent PSA.     PMH: Past Medical History  Diagnosis Date  . Hypertension   . COPD (chronic obstructive pulmonary disease)   . Hyperlipidemia   . Occlusion and stenosis of carotid artery without mention of cerebral infarction   . Atherosclerosis of native arteries of the extremities, unspecified   . Prostate pain   . GERD (gastroesophageal reflux disease)     Surgical History: Past Surgical History  Procedure Laterality Date  . Carotid endarterectomy  2003  . Coronary artery bypass graft  2000  . Hernia repair  1992  . Aortoiliac bypass   1986  . Colonoscopy  02-06-13    Dr Candace Cruise  . Prostate surgery  2001  . Cataract extraction      Home Medications:    Medication List       This list is accurate as of: 08/22/14 11:10 PM.  Always use your most recent med list.               amLODipine 5 MG tablet  Commonly known as:  NORVASC  Take 5 mg by mouth daily.     aspirin 325 MG EC tablet  Take 325 mg by mouth daily.     brimonidine 0.2 % ophthalmic solution  Commonly known as:  ALPHAGAN  Place 1 drop into both eyes 3 (three) times daily.     latanoprost 0.005 % ophthalmic solution  Commonly known as:  XALATAN  Place 1 drop into both eyes at bedtime.     meloxicam 15 MG tablet  Commonly known as:  MOBIC     MIRALAX PO  Take by mouth as needed.     NEXIUM 20 MG capsule  Generic drug:  esomeprazole  Take 20 mg by mouth 2 (two) times daily before a meal.     prednisoLONE acetate 1 % ophthalmic suspension  Commonly known as:  PRED FORTE  Place 1 drop into both eyes 4 (four) times daily.     simvastatin 20 MG tablet  Commonly known as:  ZOCOR  Take 20 mg by mouth daily at 6 PM.        Allergies:  Allergies  Allergen Reactions  . Sulfa Antibiotics Other (See Comments)  . Penicillins Rash    Family History: Family History  Problem Relation Age of Onset  . Cirrhosis Mother     liver  . Heart attack Father     Social History:  reports that he quit smoking about 11 years ago. He has never used smokeless tobacco. He reports that he does not  drink alcohol or use illicit drugs.  ROS: Urological Symptom Review  Patient is experiencing the following symptoms: Get up at night to urinate   Review of Systems  Gastrointestinal (upper)  : Indigestion/heartburn  Gastrointestinal (lower) : Constipation  Constitutional : Fatigue  Skin: Negative for skin symptoms  Eyes: Blurred vision  Ear/Nose/Throat : Negative for Ear/Nose/Throat symptoms  Hematologic/Lymphatic: Easy bruising  Cardiovascular : Negative for cardiovascular symptoms  Respiratory : Cough Shortness of breath  Endocrine: Negative for endocrine symptoms  Musculoskeletal: Back pain Joint pain  Neurological: Negative for neurological symptoms  Psychologic: Negative for psychiatric symptoms   Physical Exam: BP 176/68 mmHg  Pulse 60  Resp 18  Ht 5\' 8"  (1.727 m)  Wt 211 lb 11.2 oz (96.026 kg)  BMI 32.20 kg/m2  Constitutional:  Alert and oriented, No acute distress. HEENT: [place] AT, moist mucus membranes.  Trachea midline, no  masses. Cardiovascular: No clubbing, cyanosis, or edema. Respiratory: Normal respiratory effort, no increased work of breathing. GI: Abdomen is soft, nontender, nondistended, no abdominal masses GU: No CVA tenderness. GU: Patient with  uncircumcised phallus. Foreskin easily retracted  Urethral meatus is patent.  No penile discharge. No penile lesions or rashes. Scrotum without lesions, cysts, rashes and/or edema.  Right testicle is located scrotally.  Left testicle could not be palpated.  No masses are appreciated in the testicles. Left and right epididymis are normal. Rectal: Patient with  normal sphincter tone. Perineum without scarring or rashes. No rectal masses are appreciated.  Prostatic vault is empty. Skin: No rashes, bruises or suspicious lesions. Lymph: No cervical or inguinal adenopathy. Neurologic: Grossly intact, no focal deficits, moving all 4 extremities. Psychiatric: Normal mood and affect.  Laboratory Data: Results for orders placed or performed in visit on 08/05/14  CBC and differential  Result Value Ref Range   Hemoglobin 14.7 13.5 - 17.5 g/dL   HCT 43 41 - 53 %   Neutrophils Absolute 51 /L   Platelets 195 150 - 399 K/L   WBC 5.4 33^8/SN  Basic metabolic panel  Result Value Ref Range   Glucose 106 mg/dL   BUN 19 4 - 21 mg/dL   Creatinine 1.4 (A) .6 - 1.3 mg/dL   Potassium 4.9 3.4 - 5.3 mmol/L   Sodium 142 137 - 147 mmol/L  Lipid panel  Result Value Ref Range   LDl/HDL Ratio 2.3    Triglycerides 118 40 - 160 mg/dL   Cholesterol 145 0 - 200 mg/dL   HDL 37 35 - 70 mg/dL   LDL Cholesterol 84 mg/dL  Hepatic function panel  Result Value Ref Range   Alkaline Phosphatase 68 25 - 125 U/L   ALT 11 10 - 40 U/L   AST 16 14 - 40 U/L  Hemoglobin A1c  Result Value Ref Range   Hgb A1c MFr Bld 5.5 4.0 - 6.0 %  TSH  Result Value Ref Range   TSH 2.01 .41 - 5.90 uIU/mL  HM COLONOSCOPY  Result Value Ref Range   HM Colonoscopy hemorrhoids, adenomatous polyps-repeat in  2010    Lab Results  Component Value Date   WBC 5.4 06/24/2014   HGB 14.7 06/24/2014   HCT 43 06/24/2014   MCV 93 02/06/2013   PLT 195 06/24/2014    Lab Results  Component Value Date   CREATININE 1.4* 06/24/2014    No results found for: PSA  No results found for: TESTOSTERONE  Lab Results  Component Value Date   HGBA1C 5.5 06/24/2014  Urinalysis No results found for: COLORURINE, APPEARANCEUR, LABSPEC, PHURINE, GLUCOSEU, HGBUR, BILIRUBINUR, KETONESUR, PROTEINUR, UROBILINOGEN, NITRITE, LEUKOCYTESUR  Pertinent Imaging:   Assessment & Plan:    1.  Bilateral hydroceles:   Patient was found to have bilateral hydroceles on today's exam. I could not palpate the left testicle, so we will schedule scrotal ultrasound for further evaluation of the scrotal contents. He'll return  to the office for scrotal ultrasound report  2. History of PCa:   Patient underwent a radical prostatectomy in 2000. He is having some lower back pain.  He has not had a recent PSA, so we will obtain one today.  There are no diagnoses linked to this encounter.  Return for Scrotal Ultrasound report.  Zara Council, [facility] [address] [place], [place] [address] [phone]

## 2014-08-23 ENCOUNTER — Telehealth: Payer: Self-pay

## 2014-08-23 LAB — PSA: Prostate Specific Ag, Serum: <0.1 ng/mL (ref 0.0–4.0)

## 2014-08-23 NOTE — Telephone Encounter (Signed)
-----   Message from Nori Riis, PA-C sent at 08/23/2014  9:18 AM EDT ----- PSA is undetectable.  No prostate cancer.

## 2014-08-23 NOTE — Telephone Encounter (Signed)
Spoke with pt and made aware of lab results. Cw,lpn

## 2014-08-28 ENCOUNTER — Ambulatory Visit
Admission: RE | Admit: 2014-08-28 | Discharge: 2014-08-28 | Disposition: A | Discharge status: Home / Self Care | Payer: Medicare Other | Source: Ambulatory Visit | Attending: Urology | Admitting: Urology

## 2014-08-28 DIAGNOSIS — N433 Hydrocele, unspecified: Secondary | ICD-10-CM | POA: Insufficient documentation

## 2014-08-28 DIAGNOSIS — N503 Cyst of epididymis: Secondary | ICD-10-CM | POA: Insufficient documentation

## 2014-09-09 ENCOUNTER — Encounter: Payer: Self-pay | Admitting: Urology

## 2014-09-09 ENCOUNTER — Ambulatory Visit (INDEPENDENT_AMBULATORY_CARE_PROVIDER_SITE_OTHER): Payer: Medicare Other | Admitting: Urology

## 2014-09-09 VITALS — BP 195/75 | HR 59 | Resp 18 | Ht 68.0 in | Wt 215.0 lb

## 2014-09-09 DIAGNOSIS — Z8546 Personal history of malignant neoplasm of prostate: Secondary | ICD-10-CM

## 2014-09-09 DIAGNOSIS — N433 Hydrocele, unspecified: Secondary | ICD-10-CM | POA: Diagnosis not present

## 2014-09-09 NOTE — Progress Notes (Signed)
09/09/2014 3:30 PM   Irish Lack Feb 14, 1926 [id number]  Referring provider: Jerrol Banana., MD [address] [place], [place] [address]  Chief Complaint  Patient presents with  . Results    [place]  . Hydrocele    HPI: Mr. Lofton an 81 year old white male who presented to [place] at the request of his primary care physician, Dr. Rosanna Randy for hydroceles. Patient states the swelling has been going on for years. He is not having pain in his testicles, they are just getting bigger.  He denies any trauma to the scrotal area or infections. His baseline urinary symptoms are getting up at night to urinate.  He does have a history of prostate cancer and underwent a radical prostatectomy in 2000 at [facility]. He does not remember the name of the physician. He is having some lower back pain and his recent PSA is <0.1 ng/mL on 08/22/2014.  He presents today to discuss his scrotal ultrasound report.  I have reviewed the films with patient.  Patient's ultrasound demonstrated bilateral moderate sized hydroceles, right epididymal cysts and a left epididymal cyst versus a septated complex hydrocele.  He is not experiencing any scrotal pain at this time.   PMH: Past Medical History  Diagnosis Date  . Hypertension   . COPD (chronic obstructive pulmonary disease)   . Hyperlipidemia   . Occlusion and stenosis of carotid artery without mention of cerebral infarction   . Atherosclerosis of native arteries of the extremities, unspecified   . Prostate pain   . GERD (gastroesophageal reflux disease)     Surgical History: Past Surgical History  Procedure Laterality Date  . Carotid endarterectomy  2003  . Coronary artery bypass graft  2000  . Hernia repair  1992  . Aortoiliac bypass   1986  . Colonoscopy  02-06-13    Dr Candace Cruise  . Prostate surgery  2001  . Cataract extraction      Home Medications:    Medication List       This list is accurate as of: 09/09/14  3:30 PM.   Always use your most recent med list.               amLODipine 5 MG tablet  Commonly known as:  NORVASC  Take 5 mg by mouth daily.     aspirin 325 MG EC tablet  Take 325 mg by mouth daily.     brimonidine 0.2 % ophthalmic solution  Commonly known as:  ALPHAGAN  Place 1 drop into both eyes 3 (three) times daily.     latanoprost 0.005 % ophthalmic solution  Commonly known as:  XALATAN  Place 1 drop into both eyes at bedtime.     meloxicam 15 MG tablet  Commonly known as:  MOBIC     MIRALAX PO  Take by mouth as needed.     NEXIUM 20 MG capsule  Generic drug:  esomeprazole  Take 20 mg by mouth 2 (two) times daily before a meal.     prednisoLONE acetate 1 % ophthalmic suspension  Commonly known as:  PRED FORTE  Place 1 drop into both eyes 4 (four) times daily.     simvastatin 20 MG tablet  Commonly known as:  ZOCOR  Take 20 mg by mouth daily at 6 PM.        Allergies:  Allergies  Allergen Reactions  . Sulfa Antibiotics Other (See Comments)  . Penicillins Rash    Family History: Family  History  Problem Relation Age of Onset  . Cirrhosis Mother     liver  . Heart attack Father     Social History:  reports that he quit smoking about 11 years ago. He has never used smokeless tobacco. He reports that he does not drink alcohol or use illicit drugs.  ROS: UROLOGY Frequent Urination?: No Hard to postpone urination?: No Burning/pain with urination?: No Get up at night to urinate?: No Leakage of urine?: No Urine stream starts and stops?: No Trouble starting stream?: No Do you have to strain to urinate?: No Blood in urine?: No Urinary tract infection?: No Sexually transmitted disease?: No Injury to kidneys or bladder?: No Painful intercourse?: No Weak stream?: No Erection problems?: No Penile pain?: No  Gastrointestinal Nausea?: No Vomiting?: No Indigestion/heartburn?: No Diarrhea?: No Constipation?: No  Constitutional Fever: No Night sweats?:  No Weight loss?: No Fatigue?: No  Skin Skin rash/lesions?: No Itching?: No  Eyes Blurred vision?: No Double vision?: No  Ears/Nose/Throat Sore throat?: No Sinus problems?: No  Hematologic/Lymphatic Swollen glands?: No Easy bruising?: No  Cardiovascular Leg swelling?: No Chest pain?: No  Respiratory Cough?: No Shortness of breath?: No  Endocrine Excessive thirst?: No  Musculoskeletal Back pain?: No Joint pain?: No  Neurological Headaches?: No Dizziness?: No  Psychologic Depression?: No Anxiety?: No  Physical Exam: BP 195/75 mmHg  Pulse 59  Resp 18  Ht 5\' 8"  (1.727 m)  Wt 215 lb (97.523 kg)  BMI 32.70 kg/m2   Laboratory Data: Lab Results  Component Value Date   WBC 5.4 06/24/2014   HGB 14.7 06/24/2014   HCT 43 06/24/2014   MCV 93 02/06/2013   PLT 195 06/24/2014    Lab Results  Component Value Date   CREATININE 1.4* 06/24/2014    Lab Results  Component Value Date   PSA <0.1 08/22/2014    No results found for: TESTOSTERONE  Lab Results  Component Value Date   HGBA1C 5.5 06/24/2014    Urinalysis    Component Value Date/Time   COLORURINE Yellow 02/05/2013 0038   APPEARANCEUR Clear 02/05/2013 0038   LABSPEC 1.051 02/05/2013 0038   PHURINE 5.0 02/05/2013 0038   GLUCOSEU Negative 02/05/2013 0038   HGBUR Negative 02/05/2013 0038   BILIRUBINUR Negative 02/05/2013 0038   KETONESUR Negative 02/05/2013 0038   PROTEINUR Negative 02/05/2013 0038   NITRITE Negative 02/05/2013 0038   LEUKOCYTESUR Negative 02/05/2013 0038    Pertinent Imaging: CLINICAL DATA: Scrotal swelling.  EXAM: SCROTAL ULTRASOUND  DOPPLER ULTRASOUND OF THE TESTICLES  TECHNIQUE: Complete ultrasound examination of the testicles, epididymis, and other scrotal structures was performed. Color and spectral Doppler ultrasound were also utilized to evaluate blood flow to the testicles.  COMPARISON: None.  FINDINGS: Right testicle  Measurements: 4.8 x  3.2 x 3.1 cm. A a tiny 3 mm and 4 mm simple cyst noted in the right testicle. No significant mass.  Left testicle  Measurements: 4.4 x 3.1 x 2.9 cm. No mass or microlithiasis visualized.  Right epididymis: Multiple right epididymal cyst, the largest measures 2 cm.  Left epididymis: Large 7.1 x 4.9 x 6.1 cm epididymal cyst versus septated complex hydrocele.  Hydrocele: Bilateral moderate hydroceles noted.  Varicocele: None visualized.  Pulsed Doppler interrogation of both testes demonstrates normal low resistance arterial and venous waveforms bilaterally.  IMPRESSION: 1. Large 7.1 x 4.9 x 6.1 cm left epididymal cyst versus septated complex hydrocele. 2. Right epididymal cysts. Largest measures 2 cm. 3. Bilateral moderate size hydroceles. 4. No evidence of  testicular torsion or testicular mass.   Electronically Signed  By: Marcello Moores Register  On: 08/28/2014 15:44   Assessment & Plan:   1. Bilateral hydroceles: Patient was found to have bilateral hydroceles on scrotal ultrasound. Discussed conservative management, drainage of the hydrocele in office and surgical correction of the hydroceles.  He has decided to continue with conservative management. He will follow-up in one year or sooner if the hydroceles become larger or cause him discomfort.  2. History of PCa: Patient underwent a radical prostatectomy in 2000. He is having some lower back pain. His recent PSA was <0.1 ng/mL on 08/22/2014.    There are no diagnoses linked to this encounter.  No Follow-up on file.  Zara Council, [facility] [address] [place], [place] [address] [phone]

## 2014-12-05 ENCOUNTER — Ambulatory Visit (INDEPENDENT_AMBULATORY_CARE_PROVIDER_SITE_OTHER): Payer: Medicare Other

## 2014-12-05 DIAGNOSIS — Z23 Encounter for immunization: Secondary | ICD-10-CM | POA: Diagnosis not present

## 2015-01-08 ENCOUNTER — Ambulatory Visit (INDEPENDENT_AMBULATORY_CARE_PROVIDER_SITE_OTHER): Payer: Medicare Other | Admitting: Family Medicine

## 2015-01-08 VITALS — BP 170/68 | HR 58 | Temp 97.8°F | Resp 14 | Ht 68.0 in | Wt 216.0 lb

## 2015-01-08 DIAGNOSIS — J42 Unspecified chronic bronchitis: Secondary | ICD-10-CM | POA: Diagnosis not present

## 2015-01-08 DIAGNOSIS — Z0000 Encounter for general adult medical examination without abnormal findings: Secondary | ICD-10-CM

## 2015-01-08 MED ORDER — DOXYCYCLINE HYCLATE 100 MG PO TABS: 100.0000 mg | ORAL_TABLET | Freq: Two times a day (BID) | ORAL | Status: DC

## 2015-01-08 NOTE — Progress Notes (Signed)
Patient ID: Johnathan [name], male   DOB: 01-19-1926, 81 y.o.   MRN: [id number] Patient: Johnathan [name], Male    DOB: Apr 14, 1925, 81 y.o.   MRN: [id number] Visit Date: 01/08/2015  Today's Provider: Wilhemena Durie, MD   Chief Complaint  Patient presents with  . Annual Exam   Subjective:   Johnathan [name] is a 81 y.o. male who presents today for his Subsequent Annual Wellness Visit. He feels fairly well. He reports exercising none. He reports he is sleeping poorly. Pt quit smoking 2005 after 61 pack year history. Review of Systems  Constitutional: Negative for fever, chills, diaphoresis, activity change, appetite change, fatigue and unexpected weight change.  HENT: Negative for congestion, dental problem, drooling, ear discharge, ear pain, facial swelling, hearing loss, mouth sores, nosebleeds, postnasal drip, rhinorrhea, sinus pressure, sneezing, sore throat, tinnitus, trouble swallowing and voice change.   Eyes: Positive for photophobia, redness and visual disturbance. Negative for pain, discharge and itching.  Respiratory: Positive for cough and shortness of breath. Negative for apnea, choking, chest tightness, wheezing and stridor.   Cardiovascular: Negative for chest pain, palpitations and leg swelling.  Gastrointestinal: Negative for nausea, vomiting, abdominal pain, diarrhea, constipation, blood in stool, abdominal distention, anal bleeding and rectal pain.  Endocrine: Negative for cold intolerance, heat intolerance, polydipsia, polyphagia and polyuria.  Genitourinary: Negative for dysuria, urgency, frequency, hematuria, flank pain, decreased urine volume, discharge, penile swelling, scrotal swelling, enuresis, difficulty urinating, genital sores, penile pain and testicular pain.  Musculoskeletal: Negative for myalgias, back pain, joint swelling, arthralgias, gait problem, neck pain and neck stiffness.  Skin: Negative for color change, pallor, rash and wound.  Neurological: Positive for  numbness. Negative for dizziness, tremors, seizures, syncope, facial asymmetry, speech difficulty, weakness, light-headedness and headaches.  Hematological: Negative for adenopathy. Does not bruise/bleed easily.  Psychiatric/Behavioral: Negative for suicidal ideas, hallucinations, behavioral problems, confusion, sleep disturbance, self-injury, dysphoric mood, decreased concentration and agitation. The patient is not nervous/anxious and is not hyperactive.     Patient Active Problem List   Diagnosis Date Noted  . Hydrocele, bilateral 09/09/2014  . Hydrocele sac 08/22/2014  . History of prostate cancer 08/22/2014  . ASCVD (arteriosclerotic cardiovascular disease) 08/06/2014  . Cardiomyopathy, ischemic 08/06/2014  . Prostate cancer ([place]) 08/06/2014  . Obesity 08/06/2014  . Insomnia 08/06/2014  . Neuropathy ([place]) 08/06/2014  . Abdominal aortic aneurysm ([place]) 08/06/2014  . COPD (chronic obstructive pulmonary disease) ([place]) 08/06/2014  . Hyperglycemia 08/06/2014  . Acid reflux 08/05/2014  . Age-related macular degeneration 08/05/2014  . Branch retinal vein occlusion 08/05/2014  . 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 Hypercholesteremia 08/05/2014  . Pseudoaphakia 08/05/2014  . Secondary glaucoma 08/05/2014  . Osteoarthritis 08/05/2014  . Carotid stenosis 04/02/2014  . PAD (peripheral artery disease) ([place]) 04/02/2014  . 3-vessel CAD 11/06/2013  . Colonic stricture ([place]) 02/21/2013  . BP (high blood pressure) 11/22/2012  . Kidney failure 11/21/2012  . BK (bullous keratopathy) 06/28/2012    Social History   Social History  . Marital Status: Widowed    Spouse Name: N/A  . Number of Children: 4  . Years of Education: N/A   Occupational History  . retired    Social History Main Topics  . Smoking status: Former Smoker -- 1.00 packs/day for 30 years    Quit date: 02/16/2003  . Smokeless tobacco: Never Used  . Alcohol Use: No  . Drug Use: No  . Sexual Activity: Not on file    Other Topics Concern  . Not on  file   Social History Narrative    Past Surgical History  Procedure Laterality Date  . Carotid endarterectomy  2003  . Coronary artery bypass graft  2000  . Hernia repair  1992  . Aortoiliac bypass   1986  . Colonoscopy  02-06-13    Dr Candace Cruise  . Prostate surgery  2001  . Cataract extraction      His family history includes Cirrhosis in his mother; Heart attack in his father.    Outpatient Prescriptions Prior to Visit  Medication Sig Dispense Refill  . amLODipine (NORVASC) 5 MG tablet Take 5 mg by mouth daily.     [name] aspirin 325 MG EC tablet Take 325 mg by mouth daily.    . brimonidine (ALPHAGAN) 0.2 % ophthalmic solution Place 1 drop into both eyes 3 (three) times daily.    [name] latanoprost (XALATAN) 0.005 % ophthalmic solution Place 1 drop into both eyes at bedtime.     [name] NEXIUM 20 MG capsule Take 20 mg by mouth 2 (two) times daily before a meal.     . Polyethylene Glycol 3350 (MIRALAX PO) Take by mouth as needed.    . prednisoLONE acetate (PRED FORTE) 1 % ophthalmic suspension Place 1 drop into both eyes 4 (four) times daily.    . simvastatin (ZOCOR) 20 MG tablet Take 20 mg by mouth daily at 6 PM.     . meloxicam (MOBIC) 15 MG tablet      No facility-administered medications prior to visit.    Allergies  Allergen Reactions  . Sulfa Antibiotics Other (See Comments)  . Penicillins Rash    Patient Care Team: Jerrol Banana., MD as PCP - General (Family Medicine) Seeplaputhur Robinette Haines, MD (General Surgery) Hulen Luster, MD as Physician Assistant (Internal Medicine)  Objective:   Vitals:  Filed Vitals:   01/08/15 0910  BP: 170/68  Pulse: 58  Temp: 97.8 F (36.6 C)  TempSrc: Oral  Resp: 14  Height: 5\' 8"  (1.727 m)  Weight: 216 lb (97.977 kg)    Physical Exam  Constitutional: He is oriented to person, place, and time. He appears well-developed and well-nourished.  HENT:  Head: Normocephalic and atraumatic.  Right Ear: External  ear normal.  Left Ear: External ear normal.  Nose: Nose normal.  Mouth/Throat: Oropharynx is clear and moist.  Eyes: Conjunctivae and EOM are normal. Pupils are equal, round, and reactive to light.  Neck: Normal range of motion. Neck supple.  Cardiovascular: Normal rate, regular rhythm, normal heart sounds and intact distal pulses.   Pulmonary/Chest: Effort normal. No respiratory distress. He has wheezes. He exhibits no tenderness.  Expiratory Rhonchi throughout bilat bases  Abdominal: Soft. Bowel sounds are normal.  Musculoskeletal: Normal range of motion.  Neurological: He is alert and oriented to person, place, and time.  Skin: Skin is warm and dry.  Psychiatric: He has a normal mood and affect. His behavior is normal. Judgment and thought content normal.    Activities of Daily Living In your present state of health, do you have any difficulty performing the following activities: 01/08/2015 08/07/2014  Hearing? N N  Vision? Y Y  Difficulty concentrating or making decisions? N N  Walking or climbing stairs? Y N  Dressing or bathing? N N  Doing errands, shopping? Y N    Fall Risk Assessment Fall Risk  01/08/2015 08/07/2014  Falls in the past year? No No     Depression Screen PHQ 2/9 Scores 01/08/2015 08/07/2014  PHQ -  2 Score 0 0    Cognitive Testing - 6-CIT    Year: 0 4 points  Month: 0 3 points  Memorize "Pia Mau, [address], [place]"  Time (within 1 hour:) 0 3 points  Count backwards from 20: 0 2 4 points  Name months of year: 0 2 4 points  Repeat Address: 0 2 4 6 8 10  points   Total Score: 6/28  Interpretation : Normal (0-7) Abnormal (8-28)    Assessment & Plan:     Annual Wellness Visit  Reviewed patient's Family Medical History Reviewed and updated list of patient's medical providers Assessment of cognitive impairment was done Assessed patient's functional ability Established a written schedule for health screening [place]  Completed and Reviewed  Exercise Activities and Dietary recommendations Goals    None      Immunization History  Administered Date(s) Administered  . Influenza, High Dose Seasonal PF 12/05/2014  . Pneumococcal Conjugate-13 12/17/2013  . Pneumococcal Polysaccharide-23 12/31/2003, 11/21/2012    Health Maintenance  Topic Date Due  . TETANUS/TDAP  08/23/1946  . ZOSTAVAX  08/23/1987  . INFLUENZA VACCINE  09/16/2015  . PNA vac Low Risk Adult  Completed      Discussed health benefits of physical activity, and encouraged him to engage in regular exercise appropriate for his age and condition.   Chronic Bronchitis Doscycycline for 1 week  [facility] 01/08/2015 9:12 AM  ------------------------------------------------------------------------------------------------------------

## 2015-01-23 ENCOUNTER — Ambulatory Visit (INDEPENDENT_AMBULATORY_CARE_PROVIDER_SITE_OTHER): Payer: Medicare Other | Admitting: Family Medicine

## 2015-01-23 VITALS — BP 158/62 | HR 58 | Temp 97.5°F | Resp 16 | Wt 218.0 lb

## 2015-01-23 DIAGNOSIS — J40 Bronchitis, not specified as acute or chronic: Secondary | ICD-10-CM

## 2015-01-23 NOTE — Progress Notes (Signed)
Patient ID: Johnathan [name], male   DOB: 1925-08-13, 81 y.o.   MRN: [id number]   Johnathan [name]  MRN: [id number] DOB: 06-14-1925  Subjective:  HPI   1. Bronchitis The patient is an 81 year old male who presents for follow up of bronchitis.  His last visit was on 01/08/15 for his annual wellness exam.  At that time he was treated with Doxycycline for bronchitis and instructed to return today for recheck.  The patient states he has finished the antibiotic but still has cough.  Patient Active Problem List   Diagnosis Date Noted  . Hydrocele, bilateral 09/09/2014  . Hydrocele sac 08/22/2014  . History of prostate cancer 08/22/2014  . ASCVD (arteriosclerotic cardiovascular disease) 08/06/2014  . Cardiomyopathy, ischemic 08/06/2014  . Prostate cancer ([place]) 08/06/2014  . Obesity 08/06/2014  . Insomnia 08/06/2014  . Neuropathy ([place]) 08/06/2014  . Abdominal aortic aneurysm ([place]) 08/06/2014  . COPD (chronic obstructive pulmonary disease) ([place]) 08/06/2014  . Hyperglycemia 08/06/2014  . Acid reflux 08/05/2014  . Age-related macular degeneration 08/05/2014  . Branch retinal vein occlusion 08/05/2014  . 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 Hypercholesteremia 08/05/2014  . Pseudoaphakia 08/05/2014  . Secondary glaucoma 08/05/2014  . Osteoarthritis 08/05/2014  . Carotid stenosis 04/02/2014  . PAD (peripheral artery disease) ([place]) 04/02/2014  . 3-vessel CAD 11/06/2013  . Colonic stricture ([place]) 02/21/2013  . BP (high blood pressure) 11/22/2012  . Kidney failure 11/21/2012  . BK (bullous keratopathy) 06/28/2012    Past Medical History  Diagnosis Date  . Hypertension   . COPD (chronic obstructive pulmonary disease)   . Hyperlipidemia   . Occlusion and stenosis of carotid artery without mention of cerebral infarction   . Atherosclerosis of native arteries of the extremities, unspecified   . Prostate pain   . GERD (gastroesophageal reflux disease)     Social History   Social History  .  Marital Status: Widowed    Spouse Name: N/A  . Number of Children: 4  . Years of Education: N/A   Occupational History  . retired    Social History Main Topics  . Smoking status: Former Smoker -- 1.00 packs/day for 30 years    Quit date: 02/16/2003  . Smokeless tobacco: Never Used  . Alcohol Use: No  . Drug Use: No  . Sexual Activity: Not on file   Other Topics Concern  . Not on file   Social History Narrative    Outpatient Prescriptions Prior to Visit  Medication Sig Dispense Refill  . amLODipine (NORVASC) 5 MG tablet Take 5 mg by mouth daily.     [name] aspirin 325 MG EC tablet Take 325 mg by mouth daily.    . brimonidine (ALPHAGAN) 0.2 % ophthalmic solution Place 1 drop into both eyes 3 (three) times daily.    [name] latanoprost (XALATAN) 0.005 % ophthalmic solution Place 1 drop into both eyes at bedtime.     [name] NEXIUM 20 MG capsule Take 20 mg by mouth 2 (two) times daily before a meal.     . Polyethylene Glycol 3350 (MIRALAX PO) Take by mouth as needed.    . prednisoLONE acetate (PRED FORTE) 1 % ophthalmic suspension Place 1 drop into both eyes 4 (four) times daily.    . simvastatin (ZOCOR) 20 MG tablet Take 20 mg by mouth daily at 6 PM.     . doxycycline (VIBRA-TABS) 100 MG tablet Take 1 tablet (100 mg total) by mouth 2 (two) times daily. 20 tablet 0  No facility-administered medications prior to visit.    Allergies  Allergen Reactions  . Sulfa Antibiotics Other (See Comments)  . Penicillins Rash    Review of Systems  Constitutional: Negative for fever, chills, weight loss, malaise/fatigue and diaphoresis.  HENT: Negative for congestion, ear discharge, ear pain, nosebleeds, sore throat and tinnitus.   Eyes: Positive for blurred vision (Secondary to glaucoma). Negative for double vision, photophobia, pain, discharge and redness.  Respiratory: Positive for cough, sputum production and shortness of breath. Negative for wheezing.   Cardiovascular: Negative for chest pain,  palpitations, orthopnea and leg swelling.  Gastrointestinal: Negative.   Genitourinary: Negative.   Musculoskeletal: Positive for joint pain.  Neurological: Negative.  Negative for weakness.  Psychiatric/Behavioral: Negative.    Objective:  BP 158/62 mmHg  Pulse 58  Temp(Src) 97.5 F (36.4 C) (Oral)  Resp 16  Wt 218 lb (98.884 kg)  Physical Exam  Constitutional: He is oriented to person, place, and time and well-developed, well-nourished, and in no distress.  HENT:  Head: Normocephalic and atraumatic.  Right Ear: External ear normal.  Left Ear: External ear normal.  Nose: Nose normal.  Eyes: Conjunctivae are normal.  Neck: Neck supple.  Cardiovascular: Normal rate, regular rhythm and normal heart sounds.   Pulmonary/Chest: Effort normal and breath sounds normal.  Abdominal: Soft.  Neurological: He is alert and oriented to person, place, and time.  Skin: Skin is warm and dry.  Psychiatric: Mood, memory, affect and judgment normal.    Assessment and Plan :  Bronchitis  Much improved.  ASCVD   Miguel Aschoff MD [facility] 01/23/2015 9:41 AM

## 2015-02-05 ENCOUNTER — Other Ambulatory Visit: Payer: Self-pay | Admitting: Family Medicine

## 2015-02-13 ENCOUNTER — Other Ambulatory Visit: Payer: Self-pay | Admitting: Family Medicine

## 2015-04-10 ENCOUNTER — Ambulatory Visit (INDEPENDENT_AMBULATORY_CARE_PROVIDER_SITE_OTHER): Payer: Medicare Other | Admitting: General Surgery

## 2015-04-10 ENCOUNTER — Encounter: Payer: Self-pay | Admitting: General Surgery

## 2015-04-10 ENCOUNTER — Ambulatory Visit: Payer: Self-pay

## 2015-04-10 VITALS — BP 132/64 | HR 52 | Resp 12 | Ht 68.0 in | Wt 216.0 lb

## 2015-04-10 DIAGNOSIS — K5669 Other intestinal obstruction: Secondary | ICD-10-CM | POA: Diagnosis not present

## 2015-04-10 DIAGNOSIS — I6529 Occlusion and stenosis of unspecified carotid artery: Secondary | ICD-10-CM

## 2015-04-10 DIAGNOSIS — I739 Peripheral vascular disease, unspecified: Secondary | ICD-10-CM | POA: Diagnosis not present

## 2015-04-10 DIAGNOSIS — K56699 Other intestinal obstruction unspecified as to partial versus complete obstruction: Secondary | ICD-10-CM

## 2015-04-10 NOTE — Progress Notes (Signed)
Patient ID: Johnathan [name], male   DOB: 30-Mar-1925, 82 y.o.   MRN: [id number]  Chief Complaint  Patient presents with  . Follow-up    carotid ultrasound    HPI Johnathan [name] is a 82 y.o. male.  Here today for carotid duplex study. He has no new complaints. No dizziness. Bowels move daily. He also has a history of PAD.  He denies any abdominal pain. No nausea or vomiting. Denies any leg complaints. 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 He had dilatation of SMA after. He was followed by Dr. Delana Meyer. I have reviewed the history of present illness with the patient.  HPI  Past Medical History  Diagnosis Date  . Hypertension   . COPD (chronic obstructive pulmonary disease) ([place])   . Hyperlipidemia   . Occlusion and stenosis of carotid artery without mention of cerebral infarction   . Atherosclerosis of native arteries of the extremities, unspecified   . Prostate pain   . GERD (gastroesophageal reflux disease)     Past Surgical History  Procedure Laterality Date  . Carotid endarterectomy  2003  . Coronary artery bypass graft  2000  . Hernia repair  1992  . Aortoiliac bypass   1986  . Colonoscopy  02-06-13    Dr Candace Cruise  . Prostate surgery  2001  . Cataract extraction      Family History  Problem Relation Age of Onset  . Cirrhosis Mother     liver  . Heart attack Father     Social History Social History  Substance Use Topics  . Smoking status: Former Smoker -- 1.00 packs/day for 30 years    Quit date: 02/16/2003  . Smokeless tobacco: Never Used  . Alcohol Use: No    Allergies  Allergen Reactions  . Sulfa Antibiotics Other (See Comments)  . Penicillins Rash    Current Outpatient Prescriptions  Medication Sig Dispense Refill  . amLODipine (NORVASC) 5 MG tablet TAKE 1 TABLET DAILY 90 tablet 3  . aspirin 325 MG EC tablet Take 325 mg by mouth daily.    . brimonidine (ALPHAGAN) 0.2 % ophthalmic solution Place 1 drop into both eyes 3 (three) times  daily.    [name] latanoprost (XALATAN) 0.005 % ophthalmic solution Place 1 drop into both eyes at bedtime.     [name] NEXIUM 20 MG capsule Take 20 mg by mouth 2 (two) times daily before a meal.     . Polyethylene Glycol 3350 (MIRALAX PO) Take by mouth as needed.    . prednisoLONE acetate (PRED FORTE) 1 % ophthalmic suspension Place 1 drop into both eyes 4 (four) times daily.    . simvastatin (ZOCOR) 20 MG tablet Take 20 mg by mouth daily at 6 PM.      No current facility-administered medications for this visit.    Review of Systems Review of Systems  Constitutional: Negative.   Respiratory: Positive for cough.   Cardiovascular: Negative.   Gastrointestinal: Negative for nausea, vomiting, abdominal pain, diarrhea and constipation.    Blood pressure 132/64, pulse 52, resp. rate 12, height 5\' 8"  (1.727 m), weight 216 lb (97.977 kg).  Physical Exam Physical Exam  Constitutional: He is oriented to person, place, and time. He appears well-developed and well-nourished.  HENT:  Mouth/Throat: Oropharynx is clear and moist.  Eyes: Conjunctivae are normal. No scleral icterus.  Neck: Neck supple.  Cardiovascular: Normal rate, regular rhythm and normal heart sounds.   Pulses:      Carotid  pulses are 0 on the right side, and on the left side with bruit.      Femoral pulses are 2+ on the right side, and 2+ on the left side.      Dorsalis pedis pulses are 2+ on the right side, and 1+ on the left side.       Posterior tibial pulses are 2+ on the right side, and 2+ on the left side.  Bilateral feet are warm.  No lower leg edema.  Pulmonary/Chest: Effort normal and breath sounds normal.  Abdominal: Soft. Bowel sounds are normal. There is no tenderness. A hernia (small umbilical hernia stable from previous years) is present.  Neurological: He is alert and oriented to person, place, and time.  Skin: Skin is warm and dry.  Psychiatric: His behavior is normal.    Data Reviewed Previous notes and carotid  ultrasound  Assessment    PAD, carotid artery disease and ischemic right colonic stricture all stable  duplex of the left carotid performed today showing minimal plaquing in bifurcation without associated stenosis. Stable from before.   Plan    Follow up in one year or sooner if new symptoms develop     PCP:  Miguel Aschoff  This information has been scribed by Karie Fetch RNBC.   [name] 04/10/2015, 11:13 AM

## 2015-04-10 NOTE — Patient Instructions (Addendum)
The patient is aware to call back for any questions or concerns.  

## 2015-06-14 ENCOUNTER — Other Ambulatory Visit: Payer: Self-pay | Admitting: Family Medicine

## 2015-06-30 ENCOUNTER — Ambulatory Visit
Admission: RE | Admit: 2015-06-30 | Discharge: 2015-06-30 | Disposition: A | Discharge status: Home / Self Care | Payer: Medicare Other | Source: Ambulatory Visit | Attending: Nurse Practitioner | Admitting: Nurse Practitioner

## 2015-06-30 ENCOUNTER — Other Ambulatory Visit: Payer: Self-pay | Admitting: Nurse Practitioner

## 2015-06-30 ENCOUNTER — Ambulatory Visit: Admission: RE | Admit: 2015-06-30 | Payer: Medicare Other | Source: Ambulatory Visit

## 2015-06-30 DIAGNOSIS — I728 Aneurysm of other specified arteries: Secondary | ICD-10-CM | POA: Insufficient documentation

## 2015-06-30 DIAGNOSIS — Z8719 Personal history of other diseases of the digestive system: Secondary | ICD-10-CM

## 2015-06-30 DIAGNOSIS — K8020 Calculus of gallbladder without cholecystitis without obstruction: Secondary | ICD-10-CM | POA: Insufficient documentation

## 2015-06-30 DIAGNOSIS — R109 Unspecified abdominal pain: Secondary | ICD-10-CM | POA: Diagnosis present

## 2015-06-30 DIAGNOSIS — K5900 Constipation, unspecified: Secondary | ICD-10-CM | POA: Diagnosis present

## 2015-06-30 HISTORY — DX: Malignant neoplasm of prostate: C61

## 2015-06-30 LAB — POCT I-STAT CREATININE: Creatinine, Ser: 1.5 mg/dL — ABNORMAL HIGH (ref 0.61–1.24)

## 2015-06-30 MED ORDER — IOPAMIDOL (ISOVUE-300) INJECTION 61%: 100.0000 mL | Freq: Once | INTRAVENOUS | Status: DC | PRN

## 2015-06-30 MED ORDER — IOPAMIDOL (ISOVUE-300) INJECTION 61%
75.0000 mL | Freq: Once | INTRAVENOUS | Status: AC | PRN
Start: 2015-06-30 — End: 2015-06-30
  Administered 2015-06-30: 75 mL via INTRAVENOUS

## 2015-07-01 ENCOUNTER — Other Ambulatory Visit: Payer: Self-pay | Admitting: Nurse Practitioner

## 2015-07-01 ENCOUNTER — Ambulatory Visit: Admission: RE | Admit: 2015-07-01 | Payer: Medicare Other | Source: Ambulatory Visit

## 2015-07-01 ENCOUNTER — Ambulatory Visit
Admission: RE | Admit: 2015-07-01 | Discharge: 2015-07-01 | Disposition: A | Discharge status: Home / Self Care | Payer: Medicare Other | Source: Ambulatory Visit | Attending: Nurse Practitioner | Admitting: Nurse Practitioner

## 2015-07-01 DIAGNOSIS — R938 Abnormal findings on diagnostic imaging of other specified body structures: Secondary | ICD-10-CM | POA: Diagnosis present

## 2015-07-01 DIAGNOSIS — Z951 Presence of aortocoronary bypass graft: Secondary | ICD-10-CM | POA: Diagnosis not present

## 2015-07-01 DIAGNOSIS — K8020 Calculus of gallbladder without cholecystitis without obstruction: Secondary | ICD-10-CM | POA: Diagnosis not present

## 2015-07-01 DIAGNOSIS — I2510 Atherosclerotic heart disease of native coronary artery without angina pectoris: Secondary | ICD-10-CM | POA: Insufficient documentation

## 2015-07-01 DIAGNOSIS — E041 Nontoxic single thyroid nodule: Secondary | ICD-10-CM | POA: Insufficient documentation

## 2015-07-01 DIAGNOSIS — I712 Thoracic aortic aneurysm, without rupture: Secondary | ICD-10-CM | POA: Insufficient documentation

## 2015-07-01 DIAGNOSIS — R9389 Abnormal findings on diagnostic imaging of other specified body structures: Secondary | ICD-10-CM

## 2015-07-01 LAB — POCT I-STAT CREATININE: Creatinine, Ser: 1.5 mg/dL — ABNORMAL HIGH (ref 0.61–1.24)

## 2015-07-01 MED ORDER — IOPAMIDOL (ISOVUE-370) INJECTION 76%
75.0000 mL | Freq: Once | INTRAVENOUS | Status: AC | PRN
Start: 2015-07-01 — End: 2015-07-01
  Administered 2015-07-01: 75 mL via INTRAVENOUS

## 2015-07-03 ENCOUNTER — Encounter: Payer: Self-pay | Admitting: Physician Assistant

## 2015-07-03 ENCOUNTER — Ambulatory Visit (INDEPENDENT_AMBULATORY_CARE_PROVIDER_SITE_OTHER): Payer: Medicare Other | Admitting: Physician Assistant

## 2015-07-03 VITALS — BP 160/80 | HR 56 | Temp 97.5°F | Resp 14 | Wt 221.8 lb

## 2015-07-03 DIAGNOSIS — E041 Nontoxic single thyroid nodule: Secondary | ICD-10-CM

## 2015-07-03 DIAGNOSIS — I712 Thoracic aortic aneurysm, without rupture, unspecified: Secondary | ICD-10-CM

## 2015-07-03 NOTE — Progress Notes (Signed)
Patient: Johnathan [name] Male    DOB: August 12, 1925   82 y.o.   MRN: [id number] Visit Date: 07/03/2015  Today's Provider: Mar Daring, PA-C   Chief Complaint  Patient presents with  . Discuss Results   Subjective:    HPI Johnathan [name] [place] a 82 year old here to discuss results of his CTA. He had went to [facility] gastroenterology for increasing abdominal pain and constipation. He does have history of colonic strictures so a CT was obtained. He was found to have a colonic stricture on the CT but was also noted to have a thoracic aortic aneurysm. He then underwent a CTA for measurement of the thoracic aortic aneurysm. He was found to have a thoracic aortic aneurysm in the ascending aorta with a transverse measurement of 5.1 x 4.9 cm as well as aneurysmal dilatation in the descending thoracic aorta [place] meters above the left hemidiaphragm measuring 4.2 x 3.5 x 3.1 cm with irregular plaque buildup. He was also found to have a dominant mass in the right lobe of the thyroid measuring 2.5 x 1.8 cm with no associated adenopathy. Thyroid labs were normal. He is scheduled for colonoscopy next week for the colonic stricture. They have requested Dr. Ubaldo Glassing give him cardiac clearance for the colonoscopy. He is here today for referral to a cardiothoracic surgeon for further evaluation of his thoracic aortic aneurysm. He is requesting to be sent to a cardiothoracic surgeon with [facility].     Allergies  Allergen Reactions  . Sulfa Antibiotics Other (See Comments)  . Penicillins Rash   Previous Medications   AMLODIPINE (NORVASC) 5 MG TABLET    TAKE 1 TABLET DAILY   ASPIRIN 325 MG EC TABLET    Take 325 mg by mouth daily.   BRIMONIDINE (ALPHAGAN) 0.2 % OPHTHALMIC SOLUTION    Place 1 drop into both eyes 2 (two) times daily.    LATANOPROST (XALATAN) 0.005 % OPHTHALMIC SOLUTION    Place 1 drop into both eyes at bedtime.    NEXIUM 20 MG CAPSULE    Take 20 mg by mouth 2 (two) times daily before a meal.    POLYETHYLENE GLYCOL 3350 (MIRALAX PO)    Take by mouth as needed.   PREDNISOLONE ACETATE (PRED FORTE) 1 % OPHTHALMIC SUSPENSION    Place 1 drop into both eyes 4 (four) times daily.   SIMVASTATIN (ZOCOR) 20 MG TABLET    TAKE 1 TABLET DAILY    Review of Systems  Constitutional: Negative.   HENT: Negative.   Respiratory: Negative.   Cardiovascular: Negative.   Gastrointestinal: Negative.  Negative for abdominal pain (improving but still having constipation).  Neurological: Negative.     Social History  Substance Use Topics  . Smoking status: Former Smoker -- 1.00 packs/day for 30 years    Types: Cigarettes    Quit date: 02/16/2003  . Smokeless tobacco: Never Used  . Alcohol Use: No   Objective:   BP 160/80 mmHg  Pulse 56  Temp(Src) 97.5 F (36.4 C) (Oral)  Resp 14  Wt 221 lb 12.8 oz (100.608 kg)  Physical Exam  Constitutional: He appears well-developed and well-nourished. No distress.  HENT:  Head: Normocephalic and atraumatic.  Neck: Normal range of motion. Neck supple. No JVD present. No tracheal deviation present. No thyromegaly (I cannot palpate thyroid nodule) present.  Cardiovascular: Normal rate, regular rhythm and normal heart sounds.  Exam reveals no gallop and no friction rub.   No murmur heard.  Pulmonary/Chest: Effort normal and breath sounds normal. No respiratory distress. He has no wheezes. He has no rales.  Abdominal: Soft. Bowel sounds are normal. He exhibits no distension and no mass. There is no tenderness. There is no rebound and no guarding.  Lymphadenopathy:    He has no cervical adenopathy.  Skin: He is not diaphoretic.  Vitals reviewed.  07/01/2015 CT ANGIO CHEST AORTA W/CM &/OR WO/CM [place] Result Narrative  CLINICAL DATA:Thoracic aortic aneurysm  EXAM: CT ANGIOGRAPHY CHEST WITH CONTRAST  TECHNIQUE: Multidetector CT imaging of the chest was performed using the standard protocol during bolus administration of intravenous contrast.  Multiplanar CT image reconstructions and MIPs were obtained to evaluate the vascular anatomy.  CONTRAST:75 mL Isovue 370 nonionic  COMPARISON:CT abdomen and pelvis including lower chest region Jun 30, 2015; chest radiograph January 28, 2010  FINDINGS: Mediastinum/Lymph Nodes: There is dilatation of the at ascending thoracic aorta with a measured transverse diameter of 5.1 x 4.9 cm. There is atherosclerotic plaque throughout the descending thoracic aorta with multiple areas of irregular plaque. There is a saccular area of aneurysmal dilatation in the descending thoracic aorta located approximately 6 cm above the left hemidiaphragm. Aneurysmal dilatation in this area measures 4.2 x 3.5 x 3.1 cm. Plaque is markedly irregular in this area. Irregular calcification is noted throughout the descending thoracic aorta. There is moderate plaque at the origins of the great vessels. Visualized great vessels otherwise appear unremarkable.  There is no demonstrable pulmonary embolus. There is extensive native coronary artery calcification at multiple sites. Pericardium is not thickened.  There is a dominant mass in the right lobe of the thyroid measuring 2.5 x 1.8 cm. There are subcentimeter mediastinal lymph nodes but no adenopathy by size criteria. There is a small hiatal hernia.  Lungs/Pleura: There is no parenchymal lung edema or consolidation. There are scattered areas of slight atelectasis in the mid lung regions bilaterally.  Upper abdomen: In the visualized upper abdomen, there is cholelithiasis. There is atherosclerotic calcification in aorta and visualized mesenteric arteries. Visualized upper abdominal structures otherwise appear unremarkable.  Musculoskeletal: There is degenerative change in the thoracic spine. There are old healed rib fractures on the left posteriorly. No blastic or lytic bone lesions are evident. Patient is status post coronary artery bypass grafting with  median sternotomy.  Review of the MIP images confirms the above findings.  IMPRESSION: Aneurysmal dilatation of the ascending thoracic aorta with a maximum transverse diameter of 5.1 x 4.9 cm. Ascending thoracic aortic aneurysm. Recommend semi-annual imaging followup by CTA or MRA and referral to cardiothoracic surgery if not already obtained. This recommendation follows 2010 ACCF/AHA/AATS/ACR/ASA/SCA/SCAI/SIR/STS/SVM Guidelines for the Diagnosis and Management of Patients With Thoracic Aortic Disease. Circulation. 2010; [id number]. There is also extensive atherosclerotic change throughout the descending aorta with saccular appearing aneurysmal dilatation focally approximately 6 cm above the left hemidiaphragm measuring 4.2 x 3.5 x 3.1 cm. Plaque is quite irregular in this area.  No parenchymal lung edema or consolidation. Scattered areas of mild atelectatic change.  No adenopathy.  Patient is status post coronary artery bypass grafting. Extensive native coronary artery calcification.  Cholelithiasis.  No demonstrable pulmonary embolus.  Dominant nodular lesion right lobe thyroid. Consider further evaluation with thyroid ultrasound. If patient is clinically hyperthyroid, consider nuclear medicine thyroid uptake and scan.  Electronically Signed By: Daneen Schick M.D. On: 07/01/2015 11:57      Assessment & Plan:     1. Thoracic aortic aneurysm without rupture [facility]) Referral made to cardiothoracic surgery  at [facility] for further evaluation of the drastic aortic aneurysms that were noted on the CTA. - Ambulatory referral to Cardiothoracic Surgery  2. Thyroid nodule Being that TSH was normal we will withhold thyroid workup at this time until after his colonoscopy and evaluation by a cardiothoracic surgeon for his thoracic aneurysms that were noted.       Mar Daring, PA-C  [facility]

## 2015-07-03 NOTE — Patient Instructions (Signed)
Thoracic Aortic Aneurysm An aneurysm is a bulge in an artery. It happens when the wall of the artery is weakened or damaged. If the aneurysm gets too big, it bursts (ruptures) and severe bleeding occurs. A thoracic aortic aneurysm is an aneurysm that occurs in the first part of the aorta, between the heart and the diaphragm. The aorta is the main artery and supplies blood from the heart to the rest of the body. A thoracic aortic aneurysm can enlarge and rupture or blood can flow between the layers of the wall of the aorta through a tear (aorticdissection). Both of these conditions can cause bleeding inside the body and can be life threatening unless diagnosed and treated promptly. CAUSES  The exact cause of a thoracic aortic aneurysm is often unknown. Some contributing factors are:   A hardening of the arteries caused by the buildup of fat and other substances in the lining of a blood vessel (arteriosclerosis).  Inflammation of the walls of an artery (arteritis).  Connective tissue diseases, such as Marfan syndrome.  Injury or trauma to the aorta.  An infection, such as syphilis or staphylococcus, in the wall of the aorta (infectious aortitis) caused by bacteria. RISK FACTORS  Risk factors that contribute to a thoracic aortic aneurysm may include:  Age older than 19 years.  High blood pressure (hypertension).  Male gender.  Ethnicity (white race).  Obesity.  Family history of aneurysm (first degree relatives only).  Tobacco use. PREVENTION  The following healthy lifestyle habits may help decrease your risk of a thoracic aortic aneurysm:  Quitting smoking. Smoking can raise your blood pressure and cause arteriosclerosis.  Limiting or avoiding alcohol.  Keeping your blood pressure, blood sugar level, and cholesterol levels within normal limits.  Decreasing your salt intake. In some people, too much salt can raise blood pressure and increase your risk of abdominal aortic  aneurysm.  Eating a diet low in saturated fats and cholesterol.  Increasing your fiber intake by including whole grains, vegetables, and fruits in your diet. Eating these foods may help lower blood pressure.  Maintaining a healthy weight.  Staying physically active and exercising regularly. SYMPTOMS  The symptoms of thoracic aortic aneurysm may vary depending on the size and rate of growth of the aneurysm. Most grow slowly and do not have any symptoms. When symptoms do occur, they may include:  Pain (chest, back, sides, or abdomen). The pain may vary in intensity. A sudden onset of severe pain may indicate that the aneurysm has ruptured.  Hoarseness.  Cough.  Shortness of breath.  Swallowing problems.  Nausea or vomiting or both. DIAGNOSIS  Since most unruptured thoracic aortic aneurysms have no symptoms, they are often discovered during diagnostic exams for other conditions. An aneurysm may be found during the following procedures:  Ultrasonography (a one-time screening for thoracic aortic aneurysm by ultrasonography is also recommended for all men aged 63-75 years who have ever smoked).  X-ray exams.  A CT scan.  An MRI.  Angiography or arteriography. TREATMENT  Treatment of a thoracic aortic aneurysm depends on the size of your aneurysm, your age, and risk factors for rupture. Medicine to control blood pressure and pain may be used to manage aneurysms smaller than 2.3 in (6 cm). Regular monitoring for enlargement may be recommended by your health care provider if:  The aneurysm is 1.2-1.5 in (3-4 cm) in size (an annual ultrasonography may be recommended).  The aneurysm is 1.5-1.8 in (4-4.5 cm) in size (an ultrasonography every 6  months may be recommended).  The aneurysm is larger than 1.8 in (4.5 cm) in size (your health care provider may ask that you be examined by a vascular surgeon). If your aneurysm is larger than 2.2 in (5.5 cm) or if it is enlarging quickly,  surgical repair may be recommended. There are two main methods for repair of an aneurysm:   Endovascular repair (a minimally invasive surgery).  Open repair. This method is used if an endovascular repair is not possible.   This information is not intended to replace advice given to you by your health care provider. Make sure you discuss any questions you have with your health care provider.   Document Released: 02/01/2005 Document Revised: 11/22/2012 Document Reviewed: 08/14/2012 Elsevier Interactive Patient Education [employer].

## 2015-07-08 NOTE — Discharge Instructions (Signed)

## 2015-07-09 ENCOUNTER — Ambulatory Visit: Payer: Medicare Other | Admitting: Anesthesiology

## 2015-07-09 ENCOUNTER — Encounter
Admission: RE | Disposition: A | Discharge status: Home / Self Care | Payer: Self-pay | Source: Ambulatory Visit | Attending: Gastroenterology

## 2015-07-09 ENCOUNTER — Ambulatory Visit
Admission: RE | Admit: 2015-07-09 | Discharge: 2015-07-09 | Disposition: A | Discharge status: Home / Self Care | Payer: Medicare Other | Source: Ambulatory Visit | Attending: Gastroenterology | Admitting: Gastroenterology

## 2015-07-09 DIAGNOSIS — Z951 Presence of aortocoronary bypass graft: Secondary | ICD-10-CM | POA: Diagnosis not present

## 2015-07-09 DIAGNOSIS — K529 Noninfective gastroenteritis and colitis, unspecified: Secondary | ICD-10-CM | POA: Insufficient documentation

## 2015-07-09 DIAGNOSIS — K5900 Constipation, unspecified: Secondary | ICD-10-CM | POA: Diagnosis present

## 2015-07-09 DIAGNOSIS — Z8673 Personal history of transient ischemic attack (TIA), and cerebral infarction without residual deficits: Secondary | ICD-10-CM | POA: Insufficient documentation

## 2015-07-09 DIAGNOSIS — J45909 Unspecified asthma, uncomplicated: Secondary | ICD-10-CM | POA: Insufficient documentation

## 2015-07-09 DIAGNOSIS — K5669 Other intestinal obstruction: Secondary | ICD-10-CM | POA: Insufficient documentation

## 2015-07-09 DIAGNOSIS — I739 Peripheral vascular disease, unspecified: Secondary | ICD-10-CM | POA: Insufficient documentation

## 2015-07-09 DIAGNOSIS — Z8719 Personal history of other diseases of the digestive system: Secondary | ICD-10-CM | POA: Diagnosis present

## 2015-07-09 DIAGNOSIS — I10 Essential (primary) hypertension: Secondary | ICD-10-CM | POA: Insufficient documentation

## 2015-07-09 DIAGNOSIS — I2510 Atherosclerotic heart disease of native coronary artery without angina pectoris: Secondary | ICD-10-CM | POA: Insufficient documentation

## 2015-07-09 DIAGNOSIS — K5730 Diverticulosis of large intestine without perforation or abscess without bleeding: Secondary | ICD-10-CM | POA: Diagnosis not present

## 2015-07-09 DIAGNOSIS — R109 Unspecified abdominal pain: Secondary | ICD-10-CM | POA: Diagnosis present

## 2015-07-09 DIAGNOSIS — Z87891 Personal history of nicotine dependence: Secondary | ICD-10-CM | POA: Diagnosis not present

## 2015-07-09 DIAGNOSIS — E7800 Pure hypercholesterolemia, unspecified: Secondary | ICD-10-CM | POA: Insufficient documentation

## 2015-07-09 DIAGNOSIS — K219 Gastro-esophageal reflux disease without esophagitis: Secondary | ICD-10-CM | POA: Diagnosis not present

## 2015-07-09 DIAGNOSIS — J449 Chronic obstructive pulmonary disease, unspecified: Secondary | ICD-10-CM | POA: Diagnosis not present

## 2015-07-09 HISTORY — DX: Transient cerebral ischemic attack, unspecified: G45.9

## 2015-07-09 HISTORY — DX: Presence of dental prosthetic device (complete) (partial): Z97.2

## 2015-07-09 HISTORY — DX: Noninfective gastroenteritis and colitis, unspecified: K52.9

## 2015-07-09 HISTORY — DX: Myoneural disorder, unspecified: G70.9

## 2015-07-09 HISTORY — PX: COLONOSCOPY WITH PROPOFOL: SHX5780

## 2015-07-09 HISTORY — DX: Unspecified osteoarthritis, unspecified site: M19.90

## 2015-07-09 HISTORY — DX: Unspecified hearing loss, unspecified ear: H91.90

## 2015-07-09 HISTORY — DX: Unspecified abdominal pain: R10.9

## 2015-07-09 HISTORY — DX: Constipation, unspecified: K59.00

## 2015-07-09 HISTORY — DX: Unspecified glaucoma: H40.9

## 2015-07-09 HISTORY — DX: Unspecified macular degeneration: H35.30

## 2015-07-09 HISTORY — DX: Unspecified asthma, uncomplicated: J45.909

## 2015-07-09 HISTORY — DX: Reserved for inherently not codable concepts without codable children: IMO0001

## 2015-07-09 HISTORY — DX: Atherosclerotic heart disease of native coronary artery without angina pectoris: I25.10

## 2015-07-09 LAB — SURGICAL PATHOLOGY

## 2015-07-09 SURGERY — COLONOSCOPY WITH PROPOFOL
Anesthesia: Monitor Anesthesia Care | Wound class: Clean Contaminated

## 2015-07-09 MED ORDER — PROPOFOL 10 MG/ML IV BOLUS
INTRAVENOUS | Status: DC | PRN
  Administered 2015-07-09 (×2): 10 mg via INTRAVENOUS
  Administered 2015-07-09: 50 mg via INTRAVENOUS
  Administered 2015-07-09 (×3): 10 mg via INTRAVENOUS

## 2015-07-09 MED ORDER — LIDOCAINE HCL (CARDIAC) 20 MG/ML IV SOLN
INTRAVENOUS | Status: DC | PRN
  Administered 2015-07-09: 40 mg via INTRAVENOUS

## 2015-07-09 MED ORDER — STERILE WATER FOR IRRIGATION IR SOLN
Status: DC | PRN
  Administered 2015-07-09: 300 mL

## 2015-07-09 MED ORDER — LACTATED RINGERS IV SOLN
INTRAVENOUS | Status: DC
  Administered 2015-07-09 (×2): via INTRAVENOUS

## 2015-07-09 SURGICAL SUPPLY — 30 items
CANISTER SUCT 1200ML W/VALVE (MISCELLANEOUS) ×2 IMPLANT
FCP ESCP3.2XJMB 240X2.8X (MISCELLANEOUS)
FORCEPS BIOP RAD 4 LRG CAP 4 (CUTTING FORCEPS) ×2 IMPLANT
FORCEPS BIOP RJ4 240 W/NDL (MISCELLANEOUS)
FORCEPS ESCP3.2XJMB 240X2.8X (MISCELLANEOUS) IMPLANT
GOWN CVR UNV OPN BCK APRN NK (MISCELLANEOUS) ×1 IMPLANT
GOWN ISOL THUMB LOOP REG UNIV (MISCELLANEOUS) ×1
GOWN STRL REUS W/ TWL LRG LVL3 (GOWN DISPOSABLE) ×1 IMPLANT
GOWN STRL REUS W/TWL LRG LVL3 (GOWN DISPOSABLE) ×1
HEMOCLIP INSTINCT (CLIP) IMPLANT
INJECTOR VARIJECT VIN23 (MISCELLANEOUS) IMPLANT
KIT CO2 TUBING (TUBING) IMPLANT
KIT DEFENDO VALVE AND CONN (KITS) IMPLANT
KIT ENDO PROCEDURE OLY (KITS) ×2 IMPLANT
LIGATOR MULTIBAND 6SHOOTER MBL (MISCELLANEOUS) IMPLANT
MARKER SPOT ENDO TATTOO 5ML (MISCELLANEOUS) IMPLANT
PAD GROUND ADULT SPLIT (MISCELLANEOUS) IMPLANT
SNARE SHORT THROW 13M SML OVAL (MISCELLANEOUS) IMPLANT
SNARE SHORT THROW 30M LRG OVAL (MISCELLANEOUS) IMPLANT
SPOT EX ENDOSCOPIC TATTOO (MISCELLANEOUS)
SUCTION POLY TRAP 4CHAMBER (MISCELLANEOUS) IMPLANT
TRAP SUCTION POLY (MISCELLANEOUS) IMPLANT
TUBING CONN 6MMX3.1M (TUBING)
TUBING SUCTION CONN 0.25 STRL (TUBING) IMPLANT
UNDERPAD 30X60 958B10 (PK) (MISCELLANEOUS) IMPLANT
VALVE BIOPSY ENDO (VALVE) IMPLANT
VARIJECT INJECTOR VIN23 (MISCELLANEOUS)
WATER AUXILLARY (MISCELLANEOUS) IMPLANT
WATER STERILE IRR 250ML POUR (IV SOLUTION) IMPLANT
WATER STERILE IRR 500ML POUR (IV SOLUTION) IMPLANT

## 2015-07-09 NOTE — Anesthesia Postprocedure Evaluation (Signed)
Anesthesia Post Note  Patient: Johnathan [name]  Procedure(s) Performed: Procedure(s) (LRB): COLONOSCOPY WITH PROPOFOL (N/A)  Patient location during evaluation: PACU Anesthesia Type: MAC Level of consciousness: awake and alert Pain management: pain level controlled Vital Signs Assessment: post-procedure vital signs reviewed and stable Respiratory status: spontaneous breathing, nonlabored ventilation, respiratory function stable and patient connected to nasal cannula oxygen Cardiovascular status: stable and blood pressure returned to baseline Anesthetic complications: no    Shanan Mcmiller C

## 2015-07-09 NOTE — Transfer of Care (Signed)
Immediate Anesthesia Transfer of Care Note  Patient: Johnathan [name]  Procedure(s) Performed: Procedure(s): COLONOSCOPY WITH PROPOFOL (N/A)  Patient Location: PACU  Anesthesia Type: MAC  Level of Consciousness: awake, alert  and patient cooperative  Airway and Oxygen Therapy: Patient Spontanous Breathing and Patient connected to supplemental oxygen  Post-op Assessment: Post-op Vital signs reviewed, Patient's Cardiovascular Status Stable, Respiratory Function Stable, Patent Airway and No signs of Nausea or vomiting  Post-op Vital Signs: Reviewed and stable  Complications: No apparent anesthesia complications

## 2015-07-09 NOTE — Anesthesia Preprocedure Evaluation (Signed)
Anesthesia Evaluation    Airway Mallampati: II  TM Distance: >3 FB Neck ROM: Full    Dental no notable dental hx.    Pulmonary COPD, former smoker,    Pulmonary exam normal breath sounds clear to auscultation       Cardiovascular hypertension, + CAD and + Peripheral Vascular Disease  Normal cardiovascular exam Rhythm:Regular Rate:Normal  Doing well s/p CABG   Neuro/Psych TIA   GI/Hepatic GERD  ,  Endo/Other    Renal/GU Renal disease     Musculoskeletal  (+) Arthritis ,   Abdominal   Peds  Hematology   Anesthesia Other Findings   Reproductive/Obstetrics                             Anesthesia Physical Anesthesia Plan  ASA: III  Anesthesia Plan: MAC   Post-op Pain Management:    Induction: Intravenous  Airway Management Planned:   Additional Equipment:   Intra-op Plan:   Post-operative Plan: Extubation in OR  Informed Consent: I have reviewed the patients History and Physical, chart, labs and discussed the procedure including the risks, benefits and alternatives for the proposed anesthesia with the patient or authorized representative who has indicated his/her understanding and acceptance.   Dental advisory given  Plan Discussed with: CRNA  Anesthesia Plan Comments:         Anesthesia Quick Evaluation

## 2015-07-09 NOTE — Op Note (Signed)
[facility] Gastroenterology Patient Name: Johnathan [name] Procedure Date: 07/09/2015 11:18 AM MRN: [id number] Account #: [id number] Date of Birth: July 19, 1925 Admit Type: Outpatient Age: 82 Room: [facility] OR ROOM 01 Gender: Male Note Status: Finalized Procedure:            Colonoscopy Indications:          Abnormal CT of the GI tract, Constipation, hx of colitis Providers:            Lupita Dawn. Candace Cruise, MD Referring MD:         Janine Ores. Rosanna Randy, MD (Referring MD) Medicines:            Monitored Anesthesia Care Complications:        No immediate complications. Procedure:            Pre-Anesthesia Assessment:                       - Prior to the procedure, a History and Physical was                        performed, and patient medications, allergies and                        sensitivities were reviewed. The patient's tolerance of                        previous anesthesia was reviewed.                       - The risks and benefits of the procedure and the                        sedation options and risks were discussed with the                        patient. All questions were answered and informed                        consent was obtained.                       - After reviewing the risks and benefits, the patient                        was deemed in satisfactory condition to undergo the                        procedure.                       After obtaining informed consent, the colonoscope was                        passed under direct vision. Throughout the procedure,                        the patient's blood pressure, pulse, and oxygen                        saturations were monitored continuously. The Olympus  CF-HQ190L Colonoscope (S#. [id number]) was introduced                        through the anus and advanced to the the ascending                        colon. The colonoscopy was performed without                        difficulty. The patient  tolerated the procedure well.                        The quality of the bowel preparation was fair. Findings:      A few small-mouthed diverticula were found in the sigmoid colon.      The exam was otherwise normal throughout the examined colon.      Tight stricture in ascending colon, which was seen previously. Unable to       get scope through. Colonic balloon dilator not available. Biopsies taken       for inflammation.      The exam was otherwise without abnormality. Impression:           - Preparation of the colon was fair.                       - Diverticulosis in the sigmoid colon.                       - The examination was otherwise normal.                       - No specimens collected. Recommendation:       - Discharge patient to home.                       - Await pathology results.                       - The findings and recommendations were discussed with                        the patient's family. Procedure Code(s):    --- Professional ---                       [phone], 53, Colonoscopy, flexible; diagnostic, including                        collection of specimen(s) by brushing or washing, when                        performed (separate procedure) Diagnosis Code(s):    --- Professional ---                       K59.00, Constipation, unspecified                       K57.30, Diverticulosis of large intestine without                        perforation or abscess without bleeding  R93.3, Abnormal findings on diagnostic imaging of other                        parts of digestive tract CPT copyright 2016 American Medical Association. All rights reserved. The codes documented in this report are preliminary and upon coder review may  be revised to meet current compliance requirements. Hulen Luster, MD 07/09/2015 11:34:52 AM This report has been signed electronically. Number of Addenda: 0 Note Initiated On: 07/09/2015 11:18 AM Total Procedure Duration: 0 hours  8 minutes 41 seconds       [facility]

## 2015-07-09 NOTE — H&P (Signed)
  Date of Initial H&P: 06/30/2015  History reviewed, patient examined, no change in status, stable for surgery.

## 2015-07-09 NOTE — Anesthesia Procedure Notes (Signed)
Procedure Name: MAC Performed by: Umar Patmon Pre-anesthesia Checklist: Patient identified, Emergency Drugs available, Suction available, Patient being monitored and Timeout performed Patient Re-evaluated:Patient Re-evaluated prior to inductionOxygen Delivery Method: Nasal cannula       

## 2015-07-10 ENCOUNTER — Encounter: Payer: Self-pay | Admitting: Gastroenterology

## 2015-07-23 ENCOUNTER — Encounter: Payer: Self-pay | Admitting: Family Medicine

## 2015-07-24 ENCOUNTER — Ambulatory Visit (INDEPENDENT_AMBULATORY_CARE_PROVIDER_SITE_OTHER): Payer: Medicare Other | Admitting: Family Medicine

## 2015-07-24 VITALS — BP 150/72 | HR 56 | Temp 97.6°F | Resp 16 | Wt 219.0 lb

## 2015-07-24 DIAGNOSIS — E7800 Pure hypercholesterolemia, unspecified: Secondary | ICD-10-CM

## 2015-07-24 DIAGNOSIS — R739 Hyperglycemia, unspecified: Secondary | ICD-10-CM | POA: Diagnosis not present

## 2015-07-24 DIAGNOSIS — I10 Essential (primary) hypertension: Secondary | ICD-10-CM

## 2015-07-24 DIAGNOSIS — K5900 Constipation, unspecified: Secondary | ICD-10-CM

## 2015-07-24 MED ORDER — LOSARTAN POTASSIUM 25 MG PO TABS: 25.0000 mg | ORAL_TABLET | Freq: Every day | ORAL | Status: DC

## 2015-07-24 NOTE — Progress Notes (Signed)
Patient ID: ALBEIRO PERALES, male   DOB: May 03, 1925, 82 y.o.   MRN: [id number]   MAMADOU MINICOZZI  MRN: [id number] DOB: [birth year]  Subjective:  HPI  The patient is an 82 year old male who presents today for 6 month follow up of his chronic disease.  He does not check his blood pressure or glucose at home.  He was last seen in May by the office PA after being told by GI that he had an aortic aneurysm.  He was referred to the cardiothoracic surgeon.  Per the patient he was told that it was stable and that they would not do anything at this time.    Patient Active Problem List   Diagnosis Date Noted  . Hydrocele, bilateral 09/09/2014  . Hydrocele sac 08/22/2014  . History of prostate cancer 08/22/2014  . ASCVD (arteriosclerotic cardiovascular disease) 08/06/2014  . Cardiomyopathy, ischemic 08/06/2014  . Prostate cancer ([place]) 08/06/2014  . Obesity 08/06/2014  . Insomnia 08/06/2014  . Neuropathy ([place]) 08/06/2014  . Abdominal aortic aneurysm ([place]) 08/06/2014  . COPD (chronic obstructive pulmonary disease) ([place]) 08/06/2014  . Hyperglycemia 08/06/2014  . Acid reflux 08/05/2014  . Age-related macular degeneration 08/05/2014  . Branch retinal vein occlusion 08/05/2014  . 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 Hypercholesteremia 08/05/2014  . Pseudoaphakia 08/05/2014  . Secondary glaucoma 08/05/2014  . Osteoarthritis 08/05/2014  . Carotid stenosis 04/02/2014  . PAD (peripheral artery disease) ([place]) 04/02/2014  . 3-vessel CAD 11/06/2013  . Colonic stricture ([place]) 02/21/2013  . BP (high blood pressure) 11/22/2012  . Kidney failure 11/21/2012  . BK (bullous keratopathy) 06/28/2012    Past Medical History  Diagnosis Date  . Hypertension   . COPD (chronic obstructive pulmonary disease) ([place])   . Hyperlipidemia   . Occlusion and stenosis of carotid artery without mention of cerebral infarction   . Atherosclerosis of native arteries of the extremities, unspecified   . Prostate pain   . GERD  (gastroesophageal reflux disease)   . Prostate cancer ([place])   . Asthma   . Constipation   . Colitis     HX OF  . Abdominal pain     INTERMITTENT  . Glaucoma     both eyes/ PATIENT CAN NOT SEE  . ARMD (age related macular degeneration)   . Shortness of breath dyspnea   . Neuromuscular disorder (HCC)     numbness in feet  . TIA (transient ischemic attack)     hx of/ 10 yrs ago  . Arthritis     hands  . Wears dentures     upper and lower  . HOH (hard of hearing)   . CAD (coronary artery disease)     3 vessel/ DR Ubaldo Glassing CARDIOLOGIST GAVE CLEARANCE    Social History   Social History  . Marital Status: Widowed    Spouse Name: N/A  . Number of Children: 4  . Years of Education: N/A   Occupational History  . retired    Social History Main Topics  . Smoking status: Former Smoker -- 1.00 packs/day for 30 years    Types: Cigarettes    Quit date: 02/16/2003  . Smokeless tobacco: Never Used  . Alcohol Use: No  . Drug Use: No  . Sexual Activity: Not on file   Other Topics Concern  . Not on file   Social History Narrative    Outpatient Prescriptions Prior to Visit  Medication Sig Dispense Refill  . amLODipine (NORVASC) 5 MG tablet TAKE 1 TABLET  DAILY (Patient taking differently: TAKE 1 TABLET DAILY/am) 90 tablet 3  . aspirin 325 MG EC tablet Take 325 mg by mouth daily.    . brimonidine (ALPHAGAN) 0.2 % ophthalmic solution Place 1 drop into both eyes 2 (two) times daily.     [name] latanoprost (XALATAN) 0.005 % ophthalmic solution Place 1 drop into both eyes at bedtime.     [name] NEXIUM 20 MG capsule Take 20 mg by mouth 2 (two) times daily before a meal.     . Polyethylene Glycol 3350 (MIRALAX PO) Take by mouth as needed.    . prednisoLONE acetate (PRED FORTE) 1 % ophthalmic suspension Place 1 drop into both eyes 4 (four) times daily.    . simvastatin (ZOCOR) 20 MG tablet TAKE 1 TABLET DAILY 90 tablet 3   No facility-administered medications prior to visit.    Allergies    Allergen Reactions  . Sulfa Antibiotics Other (See Comments)  . Penicillins Rash    Review of Systems  Constitutional: Negative for fever and malaise/fatigue.  Eyes: Negative.   Respiratory: Positive for shortness of breath (chronic but unchanged). Negative for cough and wheezing.   Cardiovascular: Positive for leg swelling. Negative for chest pain, palpitations, orthopnea, claudication and PND.  Gastrointestinal: Negative.   Skin: Negative.   Neurological: Negative for dizziness, weakness and headaches.  Endo/Heme/Allergies: Negative.   Psychiatric/Behavioral: Negative.    Objective:  BP 150/72 mmHg  Pulse 56  Temp(Src) 97.6 F (36.4 C) (Oral)  Resp 16  Wt 219 lb (99.338 kg)  Physical Exam  Constitutional: He is oriented to person, place, and time and well-developed, well-nourished, and in no distress.  HENT:  Head: Normocephalic and atraumatic.  Right Ear: External ear normal.  Left Ear: External ear normal.  Nose: Nose normal.  Eyes: Conjunctivae are normal.  Neck: Neck supple. No thyromegaly present.  Cardiovascular: Normal rate, regular rhythm and normal heart sounds.   Pulmonary/Chest: Effort normal and breath sounds normal.  Abdominal: Soft.  Lymphadenopathy:    He has no cervical adenopathy.  Neurological: He is alert and oriented to person, place, and time. GCS score is 15.  Skin: Skin is warm and dry.  Psychiatric: Mood, memory, affect and judgment normal.    Assessment and Plan :  Essential hypertension  Hyperglycemia  Hypercholesteremia CAD All risk factors treated. Miguel Aschoff MD [facility] 07/24/2015 8:03 AM

## 2015-07-25 LAB — COMPREHENSIVE METABOLIC PANEL
ALT: 10 IU/L (ref 0–44)
AST: 14 IU/L (ref 0–40)
Albumin/Globulin Ratio: 1.6 (ref 1.2–2.2)
Albumin: 4.1 g/dL (ref 3.5–4.7)
Alkaline Phosphatase: 68 IU/L (ref 39–117)
BUN/Creatinine Ratio: 17 (ref 10–24)
BUN: 22 mg/dL (ref 8–27)
Bilirubin Total: 0.4 mg/dL (ref 0.0–1.2)
CO2: 23 mmol/L (ref 18–29)
Calcium: 9.2 mg/dL (ref 8.6–10.2)
Chloride: 103 mmol/L (ref 96–106)
Creatinine, Ser: 1.3 mg/dL — ABNORMAL HIGH (ref 0.76–1.27)
GFR calc Af Amer: 57 mL/min/{1.73_m2} — ABNORMAL LOW (ref >59)
GFR calc non Af Amer: 49 mL/min/{1.73_m2} — ABNORMAL LOW (ref >59)
Globulin, Total: 2.6 g/dL (ref 1.5–4.5)
Glucose: 106 mg/dL — ABNORMAL HIGH (ref 65–99)
Potassium: 5.6 mmol/L — ABNORMAL HIGH (ref 3.5–5.2)
Sodium: 144 mmol/L (ref 134–144)
Total Protein: 6.7 g/dL (ref 6.0–8.5)

## 2015-07-25 LAB — LIPID PANEL WITH LDL/HDL RATIO
Cholesterol, Total: 141 mg/dL (ref 100–199)
HDL: 38 mg/dL — ABNORMAL LOW (ref >39)
LDL Calculated: 82 mg/dL (ref 0–99)
LDl/HDL Ratio: 2.2 ratio units (ref 0.0–3.6)
Triglycerides: 107 mg/dL (ref 0–149)
VLDL Cholesterol Cal: 21 mg/dL (ref 5–40)

## 2015-07-25 LAB — CBC WITH DIFFERENTIAL/PLATELET
Basophils Absolute: 0 10*3/uL (ref 0.0–0.2)
Basos: 0 %
EOS (ABSOLUTE): 0.1 10*3/uL (ref 0.0–0.4)
Eos: 2 %
Hematocrit: 43.4 % (ref 37.5–51.0)
Hemoglobin: 14.8 g/dL (ref 12.6–17.7)
Immature Grans (Abs): 0 10*3/uL (ref 0.0–0.1)
Immature Granulocytes: 0 %
Lymphocytes Absolute: 1.9 10*3/uL (ref 0.7–3.1)
Lymphs: 38 %
MCH: 31 pg (ref 26.6–33.0)
MCHC: 34.1 g/dL (ref 31.5–35.7)
MCV: 91 fL (ref 79–97)
Monocytes Absolute: 0.4 10*3/uL (ref 0.1–0.9)
Monocytes: 8 %
Neutrophils Absolute: 2.6 10*3/uL (ref 1.4–7.0)
Neutrophils: 52 %
Platelets: 173 10*3/uL (ref 150–379)
RBC: 4.77 x10E6/uL (ref 4.14–5.80)
RDW: 14.3 % (ref 12.3–15.4)
WBC: 5 10*3/uL (ref 3.4–10.8)

## 2015-07-25 LAB — HEMOGLOBIN A1C
Est. average glucose Bld gHb Est-mCnc: 108 mg/dL
Hgb A1c MFr Bld: 5.4 % (ref 4.8–5.6)

## 2015-07-25 LAB — TSH: TSH: 1.94 u[IU]/mL (ref 0.450–4.500)

## 2015-08-27 ENCOUNTER — Ambulatory Visit: Payer: Medicare Other | Admitting: Family Medicine

## 2015-09-01 ENCOUNTER — Other Ambulatory Visit: Payer: Self-pay | Admitting: Nurse Practitioner

## 2015-09-01 DIAGNOSIS — K56699 Other intestinal obstruction unspecified as to partial versus complete obstruction: Secondary | ICD-10-CM

## 2015-09-01 DIAGNOSIS — K5909 Other constipation: Secondary | ICD-10-CM

## 2015-09-04 ENCOUNTER — Ambulatory Visit (INDEPENDENT_AMBULATORY_CARE_PROVIDER_SITE_OTHER): Payer: Medicare Other | Admitting: Family Medicine

## 2015-09-04 ENCOUNTER — Encounter: Payer: Self-pay | Admitting: Family Medicine

## 2015-09-04 VITALS — BP 148/80 | HR 56 | Temp 97.6°F | Resp 16 | Wt 224.0 lb

## 2015-09-04 DIAGNOSIS — I10 Essential (primary) hypertension: Secondary | ICD-10-CM | POA: Diagnosis not present

## 2015-09-04 DIAGNOSIS — E7800 Pure hypercholesterolemia, unspecified: Secondary | ICD-10-CM

## 2015-09-04 DIAGNOSIS — R5383 Other fatigue: Secondary | ICD-10-CM | POA: Diagnosis not present

## 2015-09-04 NOTE — Progress Notes (Signed)
Patient ID: Johnathan [name], male   DOB: 03-26-1925, 82 y.o.   MRN: [id number]    Subjective:  HPI  Hypertension, follow-up:  BP Readings from Last 3 Encounters:  09/04/15 148/80  07/24/15 150/72  07/09/15 137/59    He was last seen for hypertension 1 months ago.  BP at that visit was 150/72. Management since that visit includes started Losartan 50 mg . He reports poor compliance with treatment. He is not having side effects.  He is not exercising. Outside blood pressures are not being checked. He is experiencing dyspnea and fatigue.  Patient denies chest pain, chest pressure/discomfort, claudication, dyspnea, exertional chest pressure/discomfort, irregular heart beat, lower extremity edema, near-syncope, orthopnea, palpitations and paroxysmal nocturnal dyspnea.   Cardiovascular risk factors include advanced age (older than 78 for men, 88 for women), dyslipidemia, hypertension, male gender and smoking/ tobacco exposure.   Wt Readings from Last 3 Encounters:  09/04/15 224 lb (101.606 kg)  07/24/15 219 lb (99.338 kg)  07/09/15 213 lb (96.616 kg)   ------------------------------------------------------------------------  Pt took 1 months worth of Losartan but did not get it refilled, because he thought it did not have any refills on it. Pt has not had any of the Losartan for over a week but will go by pharmacy when he leaves here today to pick up refill, as there was 12 refills on the medication.   Daughter is with patient and states that for the last year or so the pt is more fatigued during the day. No specific complaints. He possibly snores. Prior to Admission medications   Medication Sig Start Date End Date Taking? Authorizing Provider  amLODipine (NORVASC) 5 MG tablet TAKE 1 TABLET DAILY Patient taking differently: TAKE 1 TABLET DAILY/am 02/14/15  Yes Jerrol Banana., MD  aspirin 325 MG EC tablet Take 325 mg by mouth daily.   Yes Historical Provider, MD  brimonidine  (ALPHAGAN) 0.2 % ophthalmic solution Place 1 drop into both eyes 2 (two) times daily.    Yes Historical Provider, MD  latanoprost (XALATAN) 0.005 % ophthalmic solution Place 1 drop into both eyes at bedtime.  01/25/13  Yes Historical Provider, MD  losartan (COZAAR) 25 MG tablet Take 1 tablet (25 mg total) by mouth daily. 07/24/15  Yes Richard Maceo Pro., MD  NEXIUM 20 MG capsule Take 20 mg by mouth 2 (two) times daily before a meal.  06/25/13  Yes Historical Provider, MD  Polyethylene Glycol 3350 (MIRALAX PO) Take by mouth as needed.   Yes Historical Provider, MD  prednisoLONE acetate (PRED FORTE) 1 % ophthalmic suspension Place 1 drop into both eyes 4 (four) times daily.   Yes Historical Provider, MD  simvastatin (ZOCOR) 20 MG tablet TAKE 1 TABLET DAILY 06/14/15  Yes Richard Maceo Pro., MD    Patient Active Problem List   Diagnosis Date Noted  . Hydrocele, bilateral 09/09/2014  . Hydrocele sac 08/22/2014  . History of prostate cancer 08/22/2014  . ASCVD (arteriosclerotic cardiovascular disease) 08/06/2014  . Cardiomyopathy, ischemic 08/06/2014  . Prostate cancer ([place]) 08/06/2014  . Obesity 08/06/2014  . Insomnia 08/06/2014  . Neuropathy ([place]) 08/06/2014  . Abdominal aortic aneurysm ([place]) 08/06/2014  . COPD (chronic obstructive pulmonary disease) ([place]) 08/06/2014  . Hyperglycemia 08/06/2014  . Acid reflux 08/05/2014  . Age-related macular degeneration 08/05/2014  . Branch retinal vein occlusion 08/05/2014  . 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 Hypercholesteremia 08/05/2014  . Pseudoaphakia 08/05/2014  . Secondary glaucoma 08/05/2014  . Osteoarthritis 08/05/2014  .  Carotid stenosis 04/02/2014  . PAD (peripheral artery disease) ([place]) 04/02/2014  . 3-vessel CAD 11/06/2013  . Colonic stricture ([place]) 02/21/2013  . BP (high blood pressure) 11/22/2012  . Kidney failure 11/21/2012  . BK (bullous keratopathy) 06/28/2012    Past Medical History  Diagnosis Date  . Hypertension   .  COPD (chronic obstructive pulmonary disease) ([place])   . Hyperlipidemia   . Occlusion and stenosis of carotid artery without mention of cerebral infarction   . Atherosclerosis of native arteries of the extremities, unspecified   . Prostate pain   . GERD (gastroesophageal reflux disease)   . Prostate cancer ([place])   . Asthma   . Constipation   . Colitis     HX OF  . Abdominal pain     INTERMITTENT  . Glaucoma     both eyes/ PATIENT CAN NOT SEE  . ARMD (age related macular degeneration)   . Shortness of breath dyspnea   . Neuromuscular disorder (HCC)     numbness in feet  . TIA (transient ischemic attack)     hx of/ 10 yrs ago  . Arthritis     hands  . Wears dentures     upper and lower  . HOH (hard of hearing)   . CAD (coronary artery disease)     3 vessel/ DR Ubaldo Glassing CARDIOLOGIST GAVE CLEARANCE    Social History   Social History  . Marital Status: Widowed    Spouse Name: N/A  . Number of Children: 4  . Years of Education: N/A   Occupational History  . retired    Social History Main Topics  . Smoking status: Former Smoker -- 1.00 packs/day for 30 years    Types: Cigarettes    Quit date: 02/16/2003  . Smokeless tobacco: Never Used  . Alcohol Use: No  . Drug Use: No  . Sexual Activity: Not on file   Other Topics Concern  . Not on file   Social History Narrative    Allergies  Allergen Reactions  . Sulfa Antibiotics Other (See Comments)  . Penicillins Rash    Review of Systems  Constitutional: Positive for malaise/fatigue.  HENT: Negative.   Eyes: Negative.   Respiratory: Positive for shortness of breath (nothing knew for pt).   Cardiovascular: Negative.   Gastrointestinal: Negative.   Genitourinary: Negative.   Musculoskeletal: Negative.   Skin: Negative.   Neurological: Negative.   Endo/Heme/Allergies: Negative.   Psychiatric/Behavioral: Negative.     Immunization History  Administered Date(s) Administered  . Influenza, High Dose Seasonal PF  12/05/2014  . Pneumococcal Conjugate-13 12/17/2013  . Pneumococcal Polysaccharide-23 12/31/2003, 11/21/2012   Objective:  BP 148/80 mmHg  Pulse 56  Temp(Src) 97.6 F (36.4 C) (Oral)  Resp 16  Wt 224 lb (101.606 kg)  Physical Exam  Constitutional: He is oriented to person, place, and time and well-developed, well-nourished, and in no distress.  HENT:  Head: Normocephalic and atraumatic.  Right Ear: External ear normal.  Left Ear: External ear normal.  Nose: Nose normal.  Eyes: Conjunctivae are normal.  Neck: Neck supple. No thyromegaly present.  Cardiovascular: Normal rate, regular rhythm and normal heart sounds.   2/6 systolic murmur  Pulmonary/Chest: Effort normal and breath sounds normal.  Abdominal: Soft.  Lymphadenopathy:    He has no cervical adenopathy.  Neurological: He is alert and oriented to person, place, and time.  Skin: Skin is warm and dry.  Psychiatric: Mood, memory, affect and judgment normal.    Lab Results  Component Value Date   WBC 5.0 07/24/2015   HGB 14.7 06/24/2014   HCT 43.4 07/24/2015   PLT 173 07/24/2015   GLUCOSE 106* 07/24/2015   CHOL 141 07/24/2015   TRIG 107 07/24/2015   HDL 38* 07/24/2015   LDLCALC 82 07/24/2015   TSH 1.940 07/24/2015   HGBA1C 5.4 07/24/2015    CMP     Component Value Date/Time   NA 144 07/24/2015 0908   NA 143 02/06/2013 0541   K 5.6* 07/24/2015 0908   K 4.4 02/06/2013 0541   CL 103 07/24/2015 0908   CL 111* 02/06/2013 0541   CO2 23 07/24/2015 0908   CO2 26 02/06/2013 0541   GLUCOSE 106* 07/24/2015 0908   GLUCOSE 106* 02/06/2013 0541   BUN 22 07/24/2015 0908   BUN 23* 04/10/2013 0904   CREATININE 1.30* 07/24/2015 0908   CREATININE 1.4* 06/24/2014   CREATININE 1.28 04/10/2013 0904   CALCIUM 9.2 07/24/2015 0908   CALCIUM 8.3* 02/06/2013 0541   PROT 6.7 07/24/2015 0908   PROT 7.0 02/04/2013 2024   ALBUMIN 4.1 07/24/2015 0908   ALBUMIN 3.7 02/04/2013 2024   AST 14 07/24/2015 0908   AST 15 02/04/2013  2024   ALT 10 07/24/2015 0908   ALT 17 02/04/2013 2024   ALKPHOS 68 07/24/2015 0908   ALKPHOS 65 02/04/2013 2024   BILITOT 0.4 07/24/2015 0908   BILITOT 0.4 02/04/2013 2024   GFRNONAA 49* 07/24/2015 0908   GFRNONAA 51* 04/10/2013 0904   GFRAA 57* 07/24/2015 0908   GFRAA 59* 04/10/2013 0904    Assessment and Plan :  1. Essential hypertension Continue losartan - EKG 12-Lead  2. Hypercholesteremia   3. Other fatigue Stop Simvastatin until next office visit to see if that will help with this.  Consider sleep study. Cardiology also recommended this. More than 50% of this visit is spent in counseling regarding these issues. 4. CAD/status post CABG All risk factors treated. 5. Hypertensive nephropathy 6. Obesity Miguel Aschoff MD [facility] 09/04/2015 11:32 AM

## 2015-09-08 ENCOUNTER — Other Ambulatory Visit: Payer: Self-pay | Admitting: Family Medicine

## 2015-09-08 NOTE — Progress Notes (Signed)
09/09/2015 8:40 AM   Johnathan [name] 21-Aug-1925 [id number]  Referring provider: Jerrol Banana., MD [address] [place], [place] [address]  Chief Complaint  Patient presents with  . Hydrocele    1 year follow up   . Prostate Cancer    history of    HPI: Patient is an 82 year old Caucasian male with a history of prostate cancer and bilateral hydroceles who presents today for a one year follow up.  History of prostate cancer Patient underwent a radical prostatectomy in 2000 at [facility].  He does not remember the name of the physician. His most recent PSA was <0.1 ng/mL on 08/22/2014.  IPSS 6/1.      IPSS    Row Name 09/09/15 0800         International Prostate Symptom Score   How often have you had the sensation of not emptying your bladder? Less than half the time     How often have you had to urinate less than every two hours? Not at All     How often have you found you stopped and started again several times when you urinated? Not at All     How often have you found it difficult to postpone urination? Not at All     How often have you had a weak urinary stream? Less than 1 in 5 times     How often have you had to strain to start urination? Not at All     How many times did you typically get up at night to urinate? 2 Times     Total IPSS Score 5       Quality of Life due to urinary symptoms   If you were to spend the rest of your life with your urinary condition just the way it is now how would you feel about that? Pleased        Score:  1-7 Mild 8-19 Moderate 20-35 Severe  Bilateral hydroceles Patient presented to [place] at the request of his primary care physician, Dr. Rosanna Randy for hydroceles. Patient stated the swelling has been going on for years. He is not having pain in his testicles, they are just getting bigger.  He denies any trauma to the scrotal area or infections.  Patient's ultrasound performed on 08/28/2014 demonstrated  bilateral moderate sized hydroceles, right epididymal cysts and a left epididymal cyst versus a septated complex hydrocele.  He does have a little pain on the right side first thing in the morning.  The pain than dissipates by the evening.     PMH: Past Medical History:  Diagnosis Date  . Abdominal pain    INTERMITTENT  . ARMD (age related macular degeneration)   . Arthritis    hands  . Asthma   . Atherosclerosis of native arteries of the extremities, unspecified   . CAD (coronary artery disease)    3 vessel/ DR FATH CARDIOLOGIST GAVE CLEARANCE  . Colitis    HX OF  . Constipation   . COPD (chronic obstructive pulmonary disease) ([name])   . GERD (gastroesophageal reflux disease)   . Glaucoma    both eyes/ PATIENT CAN NOT SEE  . HOH (hard of hearing)   . Hyperlipidemia   . Hypertension   . Neuromuscular disorder (HCC)    numbness in feet  . Occlusion and stenosis of carotid artery without mention of cerebral infarction   . Prostate cancer ([place])   . Prostate pain   .  Shortness of breath dyspnea   . TIA (transient ischemic attack)    hx of/ 10 yrs ago  . Wears dentures    upper and lower    Surgical History: Past Surgical History:  Procedure Laterality Date  . aortoiliac bypass   1986  . CAROTID ENDARTERECTOMY Right 2003  . CATARACT EXTRACTION    . COLONOSCOPY  02-06-13   Dr Candace Cruise  . COLONOSCOPY WITH PROPOFOL N/A 07/09/2015   Procedure: COLONOSCOPY WITH PROPOFOL;  Surgeon: Hulen Luster, MD;  Location: [facility];  Service: Gastroenterology;  Laterality: N/A;  . CORONARY ARTERY BYPASS GRAFT  2000   x3  . HERNIA REPAIR  1992  . PROSTATE SURGERY  2001    Home Medications:    Medication List       Accurate as of 09/09/15  8:40 AM. Always use your most recent med list.          amLODipine 5 MG tablet Commonly known as:  NORVASC TAKE 1 TABLET DAILY   aspirin 325 MG EC tablet Take 325 mg by mouth daily.   brimonidine 0.2 % ophthalmic solution Commonly known  as:  ALPHAGAN Place 1 drop into both eyes 2 (two) times daily.   esomeprazole 20 MG capsule Commonly known as:  NEXIUM TAKE 1 CAPSULE TWICE A DAY   latanoprost 0.005 % ophthalmic solution Commonly known as:  XALATAN Place 1 drop into both eyes at bedtime.   losartan 25 MG tablet Commonly known as:  COZAAR Take 1 tablet (25 mg total) by mouth daily.   MIRALAX PO Take by mouth as needed.   olmesartan-hydrochlorothiazide 20-12.5 MG tablet Commonly known as:  BENICAR HCT Take by mouth.   prednisoLONE acetate 1 % ophthalmic suspension Commonly known as:  PRED FORTE Place 1 drop into both eyes 4 (four) times daily.   simvastatin 20 MG tablet Commonly known as:  ZOCOR TAKE 1 TABLET DAILY       Allergies:  Allergies  Allergen Reactions  . Sulfa Antibiotics Other (See Comments)    Other reaction(s): Other (See Comments)  . Penicillins Rash    Family History: Family History  Problem Relation Age of Onset  . Cirrhosis Mother     liver  . Heart attack Father   . Prostate cancer Paternal Uncle   . Kidney disease Neg Hx     Social History:  reports that he quit smoking about 12 years ago. His smoking use included Cigarettes. He has a 30.00 pack-year smoking history. He has never used smokeless tobacco. His alcohol and drug histories are not on file.  ROS: UROLOGY Frequent Urination?: No Hard to postpone urination?: No Burning/pain with urination?: No Get up at night to urinate?: No Leakage of urine?: No Urine stream starts and stops?: No Trouble starting stream?: No Do you have to strain to urinate?: No Blood in urine?: No Urinary tract infection?: No Sexually transmitted disease?: No Injury to kidneys or bladder?: No Painful intercourse?: No Weak stream?: No Erection problems?: No Penile pain?: No  Gastrointestinal Nausea?: No Vomiting?: No Indigestion/heartburn?: No Diarrhea?: No Constipation?: No  Constitutional Fever: No Night sweats?: No Weight  loss?: No Fatigue?: Yes  Skin Skin rash/lesions?: No Itching?: No  Eyes Blurred vision?: No Double vision?: No  Ears/Nose/Throat Sore throat?: No Sinus problems?: No  Hematologic/Lymphatic Swollen glands?: No Easy bruising?: No  Cardiovascular Leg swelling?: No Chest pain?: No  Respiratory Cough?: No Shortness of breath?: Yes  Endocrine Excessive thirst?: No  Musculoskeletal Back pain?:  No Joint pain?: No  Neurological Headaches?: No Dizziness?: No  Psychologic Depression?: No Anxiety?: No  Physical Exam: BP (!) 181/69   Pulse (!) 51   Ht 5\' 8"  (1.727 m)   Wt 220 lb 8 oz (100 kg)   BMI 33.53 kg/m   Constitutional: Well nourished. Alert and oriented, No acute distress. HEENT: [place] AT, moist mucus membranes. Trachea midline, no masses. Cardiovascular: No clubbing, cyanosis, or edema. Respiratory: Normal respiratory effort, no increased work of breathing. GI: Abdomen is soft, non tender, non distended, no abdominal masses. Liver and spleen not palpable.  No hernias appreciated.  Stool sample for occult testing is not indicated.   GU: No CVA tenderness.  No bladder fullness or masses.  Patient with uncircumcised phallus. Foreskin easily retracted Urethral meatus is patent.  No penile discharge. No penile lesions or rashes. Scrotum without lesions, cysts, rashes and/or edema.  Testicles are located scrotally bilaterally. No masses are appreciated in the testicles. Left and right epididymis are normal.  Bilateral hydroceles.  Rectal: Deferred.  Skin: No rashes, bruises or suspicious lesions. Lymph: No cervical or inguinal adenopathy. Neurologic: Grossly intact, no focal deficits, moving all 4 extremities. Psychiatric: Normal mood and affect.  Laboratory Data: Lab Results  Component Value Date   WBC 5.0 07/24/2015   HGB 14.7 06/24/2014   HCT 43.4 07/24/2015   MCV 91 07/24/2015   PLT 173 07/24/2015    Lab Results  Component Value Date   CREATININE 1.30  (H) 07/24/2015      Lab Results  Component Value Date   HGBA1C 5.4 07/24/2015      Pertinent Imaging: CLINICAL DATA: Scrotal swelling.  EXAM: SCROTAL ULTRASOUND  DOPPLER ULTRASOUND OF THE TESTICLES  TECHNIQUE: Complete ultrasound examination of the testicles, epididymis, and other scrotal structures was performed. Color and spectral Doppler ultrasound were also utilized to evaluate blood flow to the testicles.  COMPARISON: None.  FINDINGS: Right testicle  Measurements: 4.8 x 3.2 x 3.1 cm. A a tiny 3 mm and 4 mm simple cyst noted in the right testicle. No significant mass.  Left testicle  Measurements: 4.4 x 3.1 x 2.9 cm. No mass or microlithiasis visualized.  Right epididymis: Multiple right epididymal cyst, the largest measures 2 cm.  Left epididymis: Large 7.1 x 4.9 x 6.1 cm epididymal cyst versus septated complex hydrocele.  Hydrocele: Bilateral moderate hydroceles noted.  Varicocele: None visualized.  Pulsed Doppler interrogation of both testes demonstrates normal low resistance arterial and venous waveforms bilaterally.  IMPRESSION: 1. Large 7.1 x 4.9 x 6.1 cm left epididymal cyst versus septated complex hydrocele. 2. Right epididymal cysts. Largest measures 2 cm. 3. Bilateral moderate size hydroceles. 4. No evidence of testicular torsion or testicular mass.   Electronically Signed  By: Marcello Moores Register  On: 08/28/2014 15:44   Assessment & Plan:   1. Bilateral hydroceles: Patient was found to have bilateral hydroceles on scrotal ultrasound. Discussed conservative management, drainage of the hydrocele in office and surgical correction of the hydroceles.  He has decided to continue with conservative management. He will follow-up in one year or sooner if the hydroceles become larger or cause him discomfort.  2. History of PCa: Patient underwent a radical prostatectomy in 2000.  IPSS score is 6/1.   His most recent PSA  was <0.1 ng/mL on 08/22/2014.     Return in about 1 year (around 09/08/2016) for IPSS and exam.  Zara Council, PA-C  [place] [facility] [address], [place], [place] [address] [phone]  [phone]   

## 2015-09-09 ENCOUNTER — Ambulatory Visit (INDEPENDENT_AMBULATORY_CARE_PROVIDER_SITE_OTHER): Payer: Medicare Other | Admitting: Urology

## 2015-09-09 ENCOUNTER — Encounter: Payer: Self-pay | Admitting: Urology

## 2015-09-09 ENCOUNTER — Encounter: Payer: Self-pay | Admitting: Family Medicine

## 2015-09-09 VITALS — BP 181/69 | HR 51 | Ht 68.0 in | Wt 220.5 lb

## 2015-09-09 DIAGNOSIS — Z8546 Personal history of malignant neoplasm of prostate: Secondary | ICD-10-CM

## 2015-09-09 DIAGNOSIS — N433 Hydrocele, unspecified: Secondary | ICD-10-CM

## 2015-09-24 ENCOUNTER — Ambulatory Visit
Admission: RE | Admit: 2015-09-24 | Discharge: 2015-09-24 | Disposition: A | Discharge status: Home / Self Care | Payer: Medicare Other | Source: Ambulatory Visit | Attending: Nurse Practitioner | Admitting: Nurse Practitioner

## 2015-09-24 ENCOUNTER — Other Ambulatory Visit: Payer: Self-pay | Admitting: Nurse Practitioner

## 2015-09-24 DIAGNOSIS — K56699 Other intestinal obstruction unspecified as to partial versus complete obstruction: Secondary | ICD-10-CM

## 2015-09-24 DIAGNOSIS — M6289 Other specified disorders of muscle: Secondary | ICD-10-CM | POA: Diagnosis not present

## 2015-09-24 DIAGNOSIS — K5669 Other intestinal obstruction: Secondary | ICD-10-CM | POA: Insufficient documentation

## 2015-09-24 DIAGNOSIS — K5900 Constipation, unspecified: Secondary | ICD-10-CM | POA: Insufficient documentation

## 2015-09-24 DIAGNOSIS — K5909 Other constipation: Secondary | ICD-10-CM

## 2015-09-24 DIAGNOSIS — Z8719 Personal history of other diseases of the digestive system: Secondary | ICD-10-CM | POA: Diagnosis present

## 2015-11-05 ENCOUNTER — Ambulatory Visit (INDEPENDENT_AMBULATORY_CARE_PROVIDER_SITE_OTHER): Payer: Medicare Other | Admitting: Family Medicine

## 2015-11-05 ENCOUNTER — Encounter: Payer: Self-pay | Admitting: Family Medicine

## 2015-11-05 VITALS — BP 118/72 | HR 60 | Temp 98.6°F | Resp 16 | Wt 221.0 lb

## 2015-11-05 DIAGNOSIS — Z23 Encounter for immunization: Secondary | ICD-10-CM | POA: Diagnosis not present

## 2015-11-05 DIAGNOSIS — T148 Other injury of unspecified body region: Secondary | ICD-10-CM | POA: Diagnosis not present

## 2015-11-05 DIAGNOSIS — W57XXXA Bitten or stung by nonvenomous insect and other nonvenomous arthropods, initial encounter: Secondary | ICD-10-CM | POA: Diagnosis not present

## 2015-11-05 NOTE — Progress Notes (Signed)
Patient: Johnathan [name] Male    DOB: 09-18-1925   82 y.o.   MRN: [id number] Visit Date: 11/05/2015  Today's Provider: Wilhemena Durie, MD   Chief Complaint  Patient presents with  . Rash    Comes and goes for several weeks.    Subjective:    Rash  The current episode started 1 to 4 weeks ago. The problem has been waxing and waning since onset. The rash is diffuse (Pt reports the rash comes up in several places but is worse on his feet. ). The rash is characterized by itchiness, pain and swelling. He was exposed to a new medication (Started Losartan in June.). Pertinent negatives include no fatigue or fever.       Allergies  Allergen Reactions  . Sulfa Antibiotics Other (See Comments)    Other reaction(s): Other (See Comments)  . Penicillins Rash     Current Outpatient Prescriptions:  .  amLODipine (NORVASC) 5 MG tablet, TAKE 1 TABLET DAILY (Patient taking differently: TAKE 1 TABLET DAILY/am), Disp: 90 tablet, Rfl: 3 .  aspirin 325 MG EC tablet, Take 325 mg by mouth daily., Disp: , Rfl:  .  brimonidine (ALPHAGAN) 0.2 % ophthalmic solution, Place 1 drop into both eyes 2 (two) times daily. , Disp: , Rfl:  .  esomeprazole (NEXIUM) 20 MG capsule, TAKE 1 CAPSULE TWICE A DAY, Disp: 180 capsule, Rfl: 2 .  latanoprost (XALATAN) 0.005 % ophthalmic solution, Place 1 drop into both eyes at bedtime. , Disp: , Rfl:  .  losartan (COZAAR) 25 MG tablet, Take 1 tablet (25 mg total) by mouth daily., Disp: 30 tablet, Rfl: 12 .  Polyethylene Glycol 3350 (MIRALAX PO), Take by mouth as needed., Disp: , Rfl:  .  prednisoLONE acetate (PRED FORTE) 1 % ophthalmic suspension, Place 1 drop into both eyes 4 (four) times daily., Disp: , Rfl:  .  simvastatin (ZOCOR) 20 MG tablet, TAKE 1 TABLET DAILY, Disp: 90 tablet, Rfl: 3 .  olmesartan-hydrochlorothiazide (BENICAR HCT) 20-12.5 MG tablet, Take by mouth., Disp: , Rfl:   Review of Systems  Constitutional: Negative for activity change, appetite  change, chills, diaphoresis, fatigue, fever and unexpected weight change.  Respiratory: Negative.   Cardiovascular: Positive for leg swelling (Started around the same time as the rash.). Negative for chest pain and palpitations.  Musculoskeletal: Negative.   Skin: Positive for rash. Negative for color change, pallor and wound.  Neurological: Negative for dizziness, light-headedness and headaches.    Social History  Substance Use Topics  . Smoking status: Former Smoker    Packs/day: 1.00    Years: 30.00    Types: Cigarettes    Quit date: 02/16/2003  . Smokeless tobacco: Never Used  . Alcohol use Not on file   Objective:   BP 118/72 (BP Location: Right Arm, Patient Position: Sitting, Cuff Size: Large)   Pulse 60   Temp 98.6 F (37 C) (Oral)   Resp 16   Wt 221 lb (100.2 kg)   BMI 33.60 kg/m   Physical Exam  Constitutional: He is oriented to person, place, and time. He appears well-developed and well-nourished.  Cardiovascular: Normal rate, regular rhythm and normal heart sounds.   Pulmonary/Chest: Effort normal and breath sounds normal.  Neurological: He is alert and oriented to person, place, and time.  Skin:  Non-Blanching, vesicular lesions on both ankles and feet noted.     Psychiatric: He has a normal mood and affect. His behavior is normal.  Judgment and thought content normal.        Assessment & Plan:     1. Insect bites Suspect flea bites.  Advised pt to check is cat for fleas.  Continue using neosporin on the bites.  Call if worsening for not improved. This could also be chiggers. Supportive treatment   2. Flu vaccine need Given in the office today.   - Flu vaccine HIGH DOSE PF (Fluzone High dose) 3. ASCVD Risk factors treated Patient was seen and examined by Miguel Aschoff, MD, and note scribed by Ashley Royalty, CMA.        Richard Cranford Mon, MD  [facility]

## 2015-12-26 ENCOUNTER — Other Ambulatory Visit (INDEPENDENT_AMBULATORY_CARE_PROVIDER_SITE_OTHER): Payer: Self-pay | Admitting: Vascular Surgery

## 2015-12-26 DIAGNOSIS — I701 Atherosclerosis of renal artery: Secondary | ICD-10-CM

## 2015-12-29 ENCOUNTER — Ambulatory Visit (INDEPENDENT_AMBULATORY_CARE_PROVIDER_SITE_OTHER): Payer: Medicare Other | Admitting: Vascular Surgery

## 2015-12-29 ENCOUNTER — Encounter (INDEPENDENT_AMBULATORY_CARE_PROVIDER_SITE_OTHER): Payer: Self-pay | Admitting: Vascular Surgery

## 2015-12-29 ENCOUNTER — Ambulatory Visit (INDEPENDENT_AMBULATORY_CARE_PROVIDER_SITE_OTHER): Payer: Medicare Other

## 2015-12-29 VITALS — BP 137/79 | HR 60 | Resp 16 | Ht 65.0 in | Wt 221.0 lb

## 2015-12-29 DIAGNOSIS — I10 Essential (primary) hypertension: Secondary | ICD-10-CM | POA: Diagnosis not present

## 2015-12-29 DIAGNOSIS — I2510 Atherosclerotic heart disease of native coronary artery without angina pectoris: Secondary | ICD-10-CM

## 2015-12-29 DIAGNOSIS — I6529 Occlusion and stenosis of unspecified carotid artery: Secondary | ICD-10-CM

## 2015-12-29 DIAGNOSIS — I712 Thoracic aortic aneurysm, without rupture, unspecified: Secondary | ICD-10-CM | POA: Insufficient documentation

## 2015-12-29 DIAGNOSIS — I70213 Atherosclerosis of native arteries of extremities with intermittent claudication, bilateral legs: Secondary | ICD-10-CM

## 2015-12-29 DIAGNOSIS — I701 Atherosclerosis of renal artery: Secondary | ICD-10-CM

## 2015-12-29 DIAGNOSIS — I771 Stricture of artery: Secondary | ICD-10-CM

## 2015-12-29 DIAGNOSIS — K551 Chronic vascular disorders of intestine: Secondary | ICD-10-CM | POA: Insufficient documentation

## 2015-12-29 NOTE — Progress Notes (Signed)
[name] Admission History & Physical  MRN : [id number]  Johnathan [name] is a 82 y.o. ([birth year]) male who presents with chief complaint of  Chief Complaint  Patient presents with  . Re-evaluation    1 year renal follow up  .  History of Present Illness: The patient returns to the office for followup and review of the noninvasive studies. There have been no interval changes in lower extremity symptoms. He notes his blood pressure has been better. He denies post prandial symptoms or abdominal pain. He states he states he has gained too much weight and notes he now weighs more than 220 lbs.  There have been no significant changes to the patient's overall health care.  He did have a CT scan for his COPD done at [facility] and was told he has aneurysms but "not to worry".  The patient denies amaurosis fugax or recent TIA symptoms. There are no recent neurological changes noted. The patient denies history of DVT, PE or superficial thrombophlebitis. The patient denies recent episodes of angina or shortness of breath.  Renal artery duplex shows widely patent left renal artery stent and patent right renal artery past Mesenteric duplex shows mild increase in the SMA velocities, stable celiac velocities.  Current Meds  Medication Sig  . amLODipine (NORVASC) 5 MG tablet TAKE 1 TABLET DAILY (Patient taking differently: TAKE 1 TABLET DAILY/am)  . aspirin 325 MG EC tablet Take 325 mg by mouth daily.  . brimonidine (ALPHAGAN) 0.2 % ophthalmic solution Place 1 drop into both eyes 2 (two) times daily.   [name] esomeprazole (NEXIUM) 20 MG capsule TAKE 1 CAPSULE TWICE A DAY  . latanoprost (XALATAN) 0.005 % ophthalmic solution Place 1 drop into both eyes at bedtime.   [name] losartan (COZAAR) 25 MG tablet Take 1 tablet (25 mg total) by mouth daily.  [name] olmesartan-hydrochlorothiazide (BENICAR HCT) 20-12.5 MG tablet Take by mouth.  . Polyethylene Glycol 3350 (MIRALAX PO) Take by mouth as needed.   . prednisoLONE acetate (PRED FORTE) 1 % ophthalmic suspension Place 1 drop into both eyes 4 (four) times daily.  . simvastatin (ZOCOR) 20 MG tablet TAKE 1 TABLET DAILY    Past Medical History:  Diagnosis Date  . Abdominal pain    INTERMITTENT  . ARMD (age related macular degeneration)   . Arthritis    hands  . Asthma   . Atherosclerosis of native arteries of the extremities, unspecified   . CAD (coronary artery disease)    3 vessel/ DR FATH CARDIOLOGIST GAVE CLEARANCE  . Colitis    HX OF  . Constipation   . COPD (chronic obstructive pulmonary disease) ([place])   . GERD (gastroesophageal reflux disease)   . Glaucoma    both eyes/ PATIENT CAN NOT SEE  . HOH (hard of hearing)   . Hyperlipidemia   . Hypertension   . Neuromuscular disorder (HCC)    numbness in feet  . Occlusion and stenosis of carotid artery without mention of cerebral infarction   . Prostate cancer ([place])   . Prostate pain   . Shortness of breath dyspnea   . TIA (transient ischemic attack)    hx of/ 10 yrs ago  . Wears dentures    upper and lower    Past Surgical History:  Procedure Laterality Date  . aortoiliac bypass   1986  . CAROTID ENDARTERECTOMY Right 2003  . CATARACT EXTRACTION    . COLONOSCOPY  02-06-13   Dr Candace Cruise  . COLONOSCOPY WITH  PROPOFOL N/A 07/09/2015   Procedure: COLONOSCOPY WITH PROPOFOL;  Surgeon: Hulen Luster, MD;  Location: [place];  Service: Gastroenterology;  Laterality: N/A;  . CORONARY ARTERY BYPASS GRAFT  2000   x3  . HERNIA REPAIR  1992  . PROSTATE SURGERY  2001    Social History Social History  Substance Use Topics  . Smoking status: Former Smoker    Packs/day: 1.00    Years: 30.00    Types: Cigarettes    Quit date: 02/16/2003  . Smokeless tobacco: Never Used  . Alcohol use Not on file    Family History Family History  Problem Relation Age of Onset  . Cirrhosis Mother     liver  . Heart attack Father   . Prostate cancer Paternal Uncle   . Kidney disease Neg  Hx   No family history of bleeding/clotting disorders, porphyria or autoimmune disease   Allergies  Allergen Reactions  . Sulfa Antibiotics Other (See Comments)    Other reaction(s): Other (See Comments)  . Penicillins Rash     REVIEW OF SYSTEMS (Negative unless checked)  Constitutional: [] Weight loss  [] Fever  [] Chills Cardiac: [] Chest pain   [] Chest pressure   [] Palpitations   [] Shortness of breath when laying flat   [] Shortness of breath with exertion. Vascular:  [x] Pain in legs with walking   [] Pain in legs at rest  [] History of DVT   [] Phlebitis   [x] Swelling in legs   [] Varicose veins   [] Non-healing ulcers Pulmonary:   [] Uses home oxygen   [] Productive cough   [] Hemoptysis   [] Wheeze  [] COPD   [] Asthma Neurologic:  [] Dizziness   [] Seizures   [] History of stroke   [] History of TIA  [] Aphasia   [] Vissual changes   [] Weakness or numbness in arm   [] Weakness or numbness in leg Musculoskeletal:   [] Joint swelling   [x] Joint pain   [] Low back pain Hematologic:  [] Easy bruising  [] Easy bleeding   [] Hypercoagulable state   [] Anemic Gastrointestinal:  [] Diarrhea   [] Vomiting  [] Gastroesophageal reflux/heartburn   [] Difficulty swallowing. Genitourinary:  [] Chronic kidney disease   [] Difficult urination  [] Frequent urination   [] Blood in urine Skin:  [] Rashes   [] Ulcers  Psychological:  [] History of anxiety   []  History of major depression.  Physical Examination  Vitals:   12/29/15 0859  BP: 137/79  Pulse: 60  Resp: 16  Weight: 221 lb (100.2 kg)  Height: 5\' 5"  (1.651 m)   Body mass index is 36.78 kg/m. Gen: WD/WN, NAD Head: [place]/AT, No temporalis wasting.  Ear/Nose/Throat: Hearing grossly intact, nares w/o erythema or drainage, poor dentition Eyes: PER, EOMI, sclera nonicteric.  Neck: Supple, no masses.  No bruit or JVD.  Pulmonary:  Good air movement, clear to auscultation bilaterally, no use of accessory muscles.  Cardiac: RRR, normal S1, S2, no Murmurs. Vascular:  Vessel  Right Left  Radial Palpable Palpable  Ulnar Palpable Palpable  Brachial Palpable Palpable  Carotid Palpable Palpable  Femoral Palpable Palpable  Popliteal Not Palpable Not Palpable  PT Not Palpable Not Palpable  DP Not Palpable Not Palpable  Gastrointestinal: soft, non-distended. No guarding/no peritoneal signs.  Musculoskeletal: M/S 5/5 throughout.  No deformity or atrophy.  Neurologic: CN 2-12 intact. Pain and light touch intact in extremities.  Symmetrical.  Speech is fluent. Motor exam as listed above. Psychiatric: Judgment intact, Mood & affect appropriate for pt's clinical situation. Dermatologic: No rashes or ulcers noted.  No changes consistent with cellulitis. Lymph : No Cervical lymphadenopathy, no  lichenification or skin changes of chronic lymphedema.  CBC Lab Results  Component Value Date   WBC 5.0 07/24/2015   HGB 14.7 06/24/2014   HCT 43.4 07/24/2015   MCV 91 07/24/2015   PLT 173 07/24/2015    BMET    Component Value Date/Time   NA 144 07/24/2015 0908   NA 143 02/06/2013 0541   K 5.6 (H) 07/24/2015 0908   K 4.4 02/06/2013 0541   CL 103 07/24/2015 0908   CL 111 (H) 02/06/2013 0541   CO2 23 07/24/2015 0908   CO2 26 02/06/2013 0541   GLUCOSE 106 (H) 07/24/2015 0908   GLUCOSE 106 (H) 02/06/2013 0541   BUN 22 07/24/2015 0908   BUN 23 (H) 04/10/2013 0904   CREATININE 1.30 (H) 07/24/2015 0908   CREATININE 1.28 04/10/2013 0904   CALCIUM 9.2 07/24/2015 0908   CALCIUM 8.3 (L) 02/06/2013 0541   GFRNONAA 49 (L) 07/24/2015 0908   GFRNONAA 51 (L) 04/10/2013 0904   GFRAA 57 (L) 07/24/2015 0908   GFRAA 59 (L) 04/10/2013 0904   CrCl cannot be calculated (Patient's most recent lab result is older than the maximum 21 days allowed.).  COAG No results found for: INR, PROTIME  Radiology No results found.   Assessment/Plan 1. Renal artery stenosis (HCC) BP today was acceptable Given patient's atherosclerosis and PAD optimal control of the patient's hypertension is  important.  The patient's BP and noninvasive studies support the previous intervention is patent. No further intervention is indicated at this time.  Therefore the patient  will continue the current medications, no changes at this time.  The primary medical service will continue aggressive antihypertensive therapy as per the AHA guidelines    2. 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 Thoracic aortic aneurysm without rupture ([place]) No surgery or intervention at this time. The patient has an asymptomatic thoracic aortic aneurysm that is less than 4 cm in maximal diameter.  I have discussed the natural history of  aortic aneurysm and the small risk of rupture for aneurysm less than 6 cm in size.  However, as these small aneurysms tend to enlarge over time, continued surveillance with ultrasound or CT scan is mandatory. Should the aneurysms be stable in size on follow up scans then further evaluation may be limited in scope.  The patient agrees with this.  I have also discussed optimizing medical management with hypertension and lipid control and the importance of abstinence from tobacco.    The patient is also encouraged to exercise a minimum of 30 minutes 4 times a week.   Should the patient develop new onset of chest or back pain or signs of peripheral embolization they are instructed to seek medical attention immediately and to alert the physician providing care that they have an aneurysm.   The patient voices their understanding. The patient will return in 12 months with a chest CT   5. Atherosclerosis of native arteries of extremities with intermittent claudication, bilateral legs (HCC)  Recommend:  The patient has evidence of atherosclerosis of the lower extremities with  claudication.  The patient does not voice lifestyle limiting changes at this point in time.  Noninvasive studies do not suggest clinically significant change.  No invasive studies, angiography or surgery at this time The patient should continue walking and  begin a more formal exercise program.  The patient should continue antiplatelet therapy and aggressive treatment of the lipid abnormalities  No changes in the patient's medications at this time  The patient should continue wearing graduated compression socks 10-15 mmHg strength to control the mild edema.    6. 3-vessel CAD Continue cardiac medications as already ordered and reviewed, no changes at this time. Nitrates PRN for angina  Continue statin as ordered and reviewed, no changes at this time  7. Essential hypertension Continue antihypertensive medications as already ordered and reviewed, no changes at this time.    Hortencia Pilar, MD  12/29/2015 9:16 AM

## 2016-01-12 ENCOUNTER — Ambulatory Visit (INDEPENDENT_AMBULATORY_CARE_PROVIDER_SITE_OTHER): Payer: Medicare Other | Admitting: Family Medicine

## 2016-01-12 VITALS — BP 118/70 | HR 60 | Temp 98.0°F | Resp 16 | Wt 227.0 lb

## 2016-01-12 DIAGNOSIS — E875 Hyperkalemia: Secondary | ICD-10-CM

## 2016-01-12 DIAGNOSIS — C44619 Basal cell carcinoma of skin of left upper limb, including shoulder: Secondary | ICD-10-CM

## 2016-01-12 NOTE — Progress Notes (Signed)
Johnathan [name]  MRN: [id number] DOB: 1925/08/26  Subjective:  HPI  The patient is a 82 year old male who presents for follow up of fatigue and elevated potassium.  He was last seen on 09/04/15 for this and was instructed to discontinue his Zocor until we saw him today.  He states that he only stopped it for 1 month and has since started back on it.  The patient states he did not see any change in his energy level while off of the medicine.  The patient had labs in June and was hyperkalemia and was instructed to recheck it today.  The patient is on medication for his blood pressure.  While reviewing his medicines he stated that he only takes 2 medications for blood pressure.  He could not tell me which ones he takes, he did however say that one was 5 mg.  I have asked him to please bring in his medications with him whenever we see him.    The patient would also has a lesion on his left elbow that has been present for 3 months.  He states it is very sensitive to the touch.  It has not bled or drained any fluid.      Patient Active Problem List   Diagnosis Date Noted  . Renal artery stenosis ([place]) 12/29/2015  . Aneurysm of thoracic aorta ([employer]) 12/29/2015  . Superior mesenteric artery stenosis ([place]) 12/29/2015  . Hydrocele, bilateral 09/09/2014  . Hydrocele sac 08/22/2014  . History of prostate cancer 08/22/2014  . ASCVD (arteriosclerotic cardiovascular disease) 08/06/2014  . Cardiomyopathy, ischemic 08/06/2014  . Prostate cancer ([place]) 08/06/2014  . Obesity 08/06/2014  . Insomnia 08/06/2014  . Neuropathy ([place]) 08/06/2014  . Abdominal aortic aneurysm ([place]) 08/06/2014  . COPD (chronic obstructive pulmonary disease) ([place]) 08/06/2014  . Hyperglycemia 08/06/2014  . Acid reflux 08/05/2014  . Age-related macular degeneration 08/05/2014  . Branch retinal vein occlusion 08/05/2014  . 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 Hypercholesteremia 08/05/2014  . Pseudoaphakia 08/05/2014  . Secondary  glaucoma 08/05/2014  . Osteoarthritis 08/05/2014  . Carotid stenosis 04/02/2014  . PAD (peripheral artery disease) ([place]) 04/02/2014  . 3-vessel CAD 11/06/2013  . Colonic stricture 02/21/2013  . BP (high blood pressure) 11/22/2012  . Kidney failure 11/21/2012  . BK (bullous keratopathy) 06/28/2012    Past Medical History:  Diagnosis Date  . Abdominal pain    INTERMITTENT  . ARMD (age related macular degeneration)   . Arthritis    hands  . Asthma   . Atherosclerosis of native arteries of the extremities, unspecified   . CAD (coronary artery disease)    3 vessel/ DR FATH CARDIOLOGIST GAVE CLEARANCE  . Colitis    HX OF  . Constipation   . COPD (chronic obstructive pulmonary disease) ([place])   . GERD (gastroesophageal reflux disease)   . Glaucoma    both eyes/ PATIENT CAN NOT SEE  . HOH (hard of hearing)   . Hyperlipidemia   . Hypertension   . Neuromuscular disorder (HCC)    numbness in feet  . Occlusion and stenosis of carotid artery without mention of cerebral infarction   . Prostate cancer ([place])   . Prostate pain   . Shortness of breath dyspnea   . TIA (transient ischemic attack)    hx of/ 10 yrs ago  . Wears dentures    upper and lower    Social History   Social History  . Marital status: Widowed    Spouse name:  N/A  . Number of children: 4  . Years of education: N/A   Occupational History  . retired    Social History Main Topics  . Smoking status: Former Smoker    Packs/day: 1.00    Years: 30.00    Types: Cigarettes    Quit date: 02/16/2003  . Smokeless tobacco: Never Used  . Alcohol use Not on file  . Drug use: Unknown  . Sexual activity: Not on file   Other Topics Concern  . Not on file   Social History Narrative  . No narrative on file    Outpatient Encounter Prescriptions as of 01/12/2016  Medication Sig Note  . amLODipine (NORVASC) 5 MG tablet TAKE 1 TABLET DAILY (Patient taking differently: TAKE 1 TABLET DAILY/am)   . aspirin 325 MG EC  tablet Take 325 mg by mouth daily.   . brimonidine (ALPHAGAN) 0.2 % ophthalmic solution Place 1 drop into both eyes 2 (two) times daily.    [name] esomeprazole (NEXIUM) 20 MG capsule TAKE 1 CAPSULE TWICE A DAY   . latanoprost (XALATAN) 0.005 % ophthalmic solution Place 1 drop into both eyes at bedtime.  02/21/2013: Received from: External Pharmacy Received Sig:   . Polyethylene Glycol 3350 (MIRALAX PO) Take by mouth as needed.   . prednisoLONE acetate (PRED FORTE) 1 % ophthalmic suspension Place 1 drop into both eyes 4 (four) times daily.   . simvastatin (ZOCOR) 20 MG tablet TAKE 1 TABLET DAILY   . losartan (COZAAR) 25 MG tablet Take 1 tablet (25 mg total) by mouth daily.   [name] olmesartan-hydrochlorothiazide (BENICAR HCT) 20-12.5 MG tablet Take by mouth. 09/09/2015: Received from: [facility]: Take 1 tablet by mouth.   No facility-administered encounter medications on file as of 01/12/2016.     Allergies  Allergen Reactions  . Sulfa Antibiotics Other (See Comments)    Other reaction(s): Other (See Comments)  . Penicillins Rash    Review of Systems  Constitutional: Positive for malaise/fatigue. Negative for chills and fever.  Respiratory: Positive for shortness of breath (chronic, unchanged). Negative for cough and wheezing.   Cardiovascular: Negative for chest pain, palpitations, orthopnea, claudication, leg swelling and PND.  Skin: Negative for itching and rash.  Neurological: Positive for weakness.  Psychiatric/Behavioral: The patient has insomnia (chronic).     Objective:  BP 118/70 (BP Location: Right Arm, Patient Position: Sitting, Cuff Size: Normal)   Pulse 60   Temp 98 F (36.7 C) (Oral)   Resp 16   Wt 227 lb (103 kg)   BMI 37.77 kg/m   Physical Exam  Constitutional: He is well-developed, well-nourished, and in no distress.  HENT:  Head: Normocephalic and atraumatic.  Eyes: Conjunctivae are normal. Pupils are equal, round, and reactive to light.  Neck: Normal  range of motion. Neck supple.  Cardiovascular: Normal rate, regular rhythm and normal heart sounds.   Pulmonary/Chest: Effort normal. He has wheezes (mild end expiratory wheezez upper lobes bilat).  Musculoskeletal: He exhibits edema (1+ bilat.).  Skin: No rash noted. No erythema. No pallor.  Probable basal cell carcinoma of the left elbow.  Psychiatric: Mood, affect and judgment normal.    Assessment and Plan :    1. Basal cell carcinoma of skin of left upper extremity, including shoulder  - Ambulatory referral to Dermatology  2. Hyperkalemia  - Renal function panel 3.CAD 4.COPD 5.HTN  HPI, Exam and A&P Transcribed under the direction and in the presence of Wilhemena Durie., MD. Electronically Signed: Jiles Garter  [place], RMA I have done the exam and reviewed the chart and it is accurate to the best of my knowledge. Development worker, community has been used and  any errors in dictation or transcription are unintentional. Miguel Aschoff M.D. [facility]

## 2016-01-13 ENCOUNTER — Telehealth: Payer: Self-pay

## 2016-01-13 LAB — RENAL FUNCTION PANEL
Albumin: 4.2 g/dL (ref 3.5–4.7)
BUN/Creatinine Ratio: 16 (ref 10–24)
BUN: 22 mg/dL (ref 8–27)
CO2: 23 mmol/L (ref 18–29)
Calcium: 8.9 mg/dL (ref 8.6–10.2)
Chloride: 103 mmol/L (ref 96–106)
Creatinine, Ser: 1.39 mg/dL — ABNORMAL HIGH (ref 0.76–1.27)
GFR calc Af Amer: 52 mL/min/{1.73_m2} — ABNORMAL LOW (ref >59)
GFR calc non Af Amer: 45 mL/min/{1.73_m2} — ABNORMAL LOW (ref >59)
Glucose: 104 mg/dL — ABNORMAL HIGH (ref 65–99)
Phosphorus: 3.3 mg/dL (ref 2.5–4.5)
Potassium: 5.1 mmol/L (ref 3.5–5.2)
Sodium: 143 mmol/L (ref 134–144)

## 2016-01-13 NOTE — Telephone Encounter (Signed)
Patients son was advised. KW

## 2016-01-13 NOTE — Telephone Encounter (Signed)
-----   Message from Jerrol Banana., MD sent at 01/13/2016  8:15 AM EST ----- Labs stable

## 2016-02-25 ENCOUNTER — Other Ambulatory Visit: Payer: Self-pay | Admitting: Family Medicine

## 2016-04-07 ENCOUNTER — Encounter: Payer: Self-pay | Admitting: *Deleted

## 2016-04-13 ENCOUNTER — Encounter: Payer: Self-pay | Admitting: General Surgery

## 2016-04-13 ENCOUNTER — Ambulatory Visit (INDEPENDENT_AMBULATORY_CARE_PROVIDER_SITE_OTHER): Payer: Medicare Other | Admitting: General Surgery

## 2016-04-13 ENCOUNTER — Other Ambulatory Visit: Payer: Self-pay

## 2016-04-13 VITALS — BP 142/74 | HR 66 | Resp 16 | Ht 68.0 in | Wt 225.0 lb

## 2016-04-13 DIAGNOSIS — K56699 Other intestinal obstruction unspecified as to partial versus complete obstruction: Secondary | ICD-10-CM | POA: Diagnosis not present

## 2016-04-13 DIAGNOSIS — I6522 Occlusion and stenosis of left carotid artery: Secondary | ICD-10-CM

## 2016-04-13 DIAGNOSIS — I739 Peripheral vascular disease, unspecified: Secondary | ICD-10-CM

## 2016-04-13 NOTE — Patient Instructions (Addendum)
The patient is aware to call back for any questions or concerns. Follow up will continue with Dr Delana Meyer.

## 2016-04-13 NOTE — Progress Notes (Signed)
Patient ID: Johnathan [name], male   DOB: [birth year], 83 y.o.   MRN: [id number]  Chief Complaint  Patient presents with  . Follow-up    HPI Johnathan [name] is a 83 y.o. male.  Here today for a follow-up of PAD, carotid stenosis and ascending colon ischemic stricture. He has no new complaints. No dizziness, weakness, trouble with speech or visual disturbance. Difficulty with BMs but aided by the use of miralax. He denies any abdominal pain. No nausea or vomiting. Denies any leg complaint Post colonoscopy 07-09-15 by Dr Candace Cruise. He had dilatation and stent for SMA and celiac. He is s/p aortal bifemoral bypass, bilateral CEA- right side has since occluded without any symptoms. He is also followed by Dr. Delana Meyer. I have reviewed the history of present illness with the patient. HPI  Past Medical History:  Diagnosis Date  . Abdominal pain    INTERMITTENT  . ARMD (age related macular degeneration)   . Arthritis    hands  . Asthma   . Atherosclerosis of native arteries of the extremities, unspecified   . CAD (coronary artery disease)    3 vessel/ DR FATH CARDIOLOGIST GAVE CLEARANCE  . Colitis    HX OF  . Constipation   . COPD (chronic obstructive pulmonary disease) ([place])   . GERD (gastroesophageal reflux disease)   . Glaucoma    both eyes/ PATIENT CAN NOT SEE  . HOH (hard of hearing)   . Hyperlipidemia   . Hypertension   . Neuromuscular disorder (HCC)    numbness in feet  . Occlusion and stenosis of carotid artery without mention of cerebral infarction   . Prostate cancer ([place])   . Prostate pain   . Shortness of breath dyspnea   . TIA (transient ischemic attack)    hx of/ 10 yrs ago  . Wears dentures    upper and lower    Past Surgical History:  Procedure Laterality Date  . aortoiliac bypass   1986  . CAROTID ENDARTERECTOMY Right 2003  . CATARACT EXTRACTION    . COLONOSCOPY  02-06-13   Dr Candace Cruise  . COLONOSCOPY WITH PROPOFOL N/A 07/09/2015   Procedure: COLONOSCOPY WITH PROPOFOL;  Surgeon:  Hulen Luster, MD;  Location: [facility];  Service: Gastroenterology;  Laterality: N/A;  . CORONARY ARTERY BYPASS GRAFT  2000   x3  . HERNIA REPAIR  1992  . PROSTATE SURGERY  2001    Family History  Problem Relation Age of Onset  . Cirrhosis Mother     liver  . Heart attack Father   . Prostate cancer Paternal Uncle   . Kidney disease Neg Hx     Social History Social History  Substance Use Topics  . Smoking status: Former Smoker    Packs/day: 1.00    Years: 30.00    Types: Cigarettes    Quit date: 02/16/2003  . Smokeless tobacco: Never Used  . Alcohol use Not on file    Allergies  Allergen Reactions  . Sulfa Antibiotics Other (See Comments)    Other reaction(s): Other (See Comments)  . Penicillins Rash    Current Outpatient Prescriptions  Medication Sig Dispense Refill  . amLODipine (NORVASC) 5 MG tablet TAKE 1 TABLET DAILY 90 tablet 3  . aspirin 325 MG EC tablet Take 325 mg by mouth daily.    . brimonidine (ALPHAGAN) 0.2 % ophthalmic solution Place 1 drop into both eyes 2 (two) times daily.     [name] esomeprazole (NEXIUM) 20 MG capsule  TAKE 1 CAPSULE TWICE A DAY 180 capsule 2  . latanoprost (XALATAN) 0.005 % ophthalmic solution Place 1 drop into both eyes at bedtime.     [name] losartan (COZAAR) 25 MG tablet Take 1 tablet (25 mg total) by mouth daily. 30 tablet 12  . olmesartan-hydrochlorothiazide (BENICAR HCT) 20-12.5 MG tablet Take by mouth.    . Polyethylene Glycol 3350 (MIRALAX PO) Take by mouth as needed.    . prednisoLONE acetate (PRED FORTE) 1 % ophthalmic suspension Place 1 drop into both eyes 4 (four) times daily.    . simvastatin (ZOCOR) 20 MG tablet TAKE 1 TABLET DAILY 90 tablet 3   No current facility-administered medications for this visit.     Review of Systems Review of Systems  Constitutional: Negative.   Respiratory: Negative.   Cardiovascular: Negative.   Gastrointestinal: Negative for abdominal pain, constipation, diarrhea, nausea and vomiting.   Neurological: Negative for dizziness.    Blood pressure (!) 142/74, pulse 66, resp. rate 16, height 5\' 8"  (1.727 m), weight 225 lb (102.1 kg), SpO2 96 %.  Physical Exam Physical Exam  Constitutional: He is oriented to person, place, and time. He appears well-developed and well-nourished.  HENT:  Mouth/Throat: Oropharynx is clear and moist.  Eyes: Conjunctivae are normal. No scleral icterus.  Neck: Neck supple.  Cardiovascular: Normal rate, regular rhythm and normal heart sounds.   Pulses:      Carotid pulses are 0 on the right side, and 2+ on the left side with bruit.      Dorsalis pedis pulses are 2+ on the right side, and 0 on the left side.       Posterior tibial pulses are 2+ on the right side, and 2+ on the left side.  Pulmonary/Chest: Effort normal and breath sounds normal.  Abdominal: Soft. Normal appearance and bowel sounds are normal. There is no tenderness. A hernia is present.  Small umbilical hernia   Lymphadenopathy:    He has no cervical adenopathy.  Neurological: He is alert and oriented to person, place, and time.  Skin: Skin is warm and dry.  Psychiatric: His behavior is normal.    Data Reviewed Previous notes and carotid ultrasound Duplex study of the left carotid shows again some mild plaquing of the bifurcation with no associated stenosis.  Assessment    PAD, carotid artery disease and ischemic right colonic stricture all stable     Plan    Follow up will continue with Dr Delana Meyer.     This information has been scribed by Karie Fetch RN, BSN,BC.   Geddy Boydstun G 04/13/2016, 9:26 AM

## 2016-04-15 ENCOUNTER — Ambulatory Visit (INDEPENDENT_AMBULATORY_CARE_PROVIDER_SITE_OTHER): Payer: Medicare Other

## 2016-04-15 VITALS — BP 150/78 | HR 64 | Temp 98.1°F | Ht 65.0 in | Wt 228.4 lb

## 2016-04-15 DIAGNOSIS — Z0000 Encounter for general adult medical examination without abnormal findings: Secondary | ICD-10-CM | POA: Diagnosis not present

## 2016-04-15 NOTE — Patient Instructions (Signed)
 Health Maintenance, Male A healthy lifestyle and preventive care is important for your health and wellness. Ask your health care provider about what schedule of regular examinations is right for you. What should I know about weight and diet?  Eat a Healthy Diet  Eat plenty of vegetables, fruits, whole grains, low-fat dairy products, and lean protein.  Do not eat a lot of foods high in solid fats, added sugars, or salt. Maintain a Healthy Weight  Regular exercise can help you achieve or maintain a healthy weight. You should:  Do at least 150 minutes of exercise each week. The exercise should increase your heart rate and make you sweat (moderate-intensity exercise).  Do strength-training exercises at least twice a week. Watch Your Levels of Cholesterol and Blood Lipids  Have your blood tested for lipids and cholesterol every 5 years starting at 81 years of age. If you are at high risk for heart disease, you should start having your blood tested when you are 81 years old. You may need to have your cholesterol levels checked more often if:  Your lipid or cholesterol levels are high.  You are older than 81 years of age.  You are at high risk for heart disease. What should I know about cancer screening? Many types of cancers can be detected early and may often be prevented. Lung Cancer  You should be screened every year for lung cancer if:  You are a current smoker who has smoked for at least 30 years.  You are a former smoker who has quit within the past 15 years.  Talk to your health care provider about your screening options, when you should start screening, and how often you should be screened. Colorectal Cancer  Routine colorectal cancer screening usually begins at 81 years of age and should be repeated every 5-10 years until you are 81 years old. You may need to be screened more often if early forms of precancerous polyps or small growths are found. Your health care provider  may recommend screening at an earlier age if you have risk factors for colon cancer.  Your health care provider may recommend using home test kits to check for hidden blood in the stool.  A small camera at the end of a tube can be used to examine your colon (sigmoidoscopy or colonoscopy). This checks for the earliest forms of colorectal cancer. Prostate and Testicular Cancer  Depending on your age and overall health, your health care provider may do certain tests to screen for prostate and testicular cancer.  Talk to your health care provider about any symptoms or concerns you have about testicular or prostate cancer. Skin Cancer  Check your skin from head to toe regularly.  Tell your health care provider about any new moles or changes in moles, especially if:  There is a change in a mole's size, shape, or color.  You have a mole that is larger than a pencil eraser.  Always use sunscreen. Apply sunscreen liberally and repeat throughout the day.  Protect yourself by wearing long sleeves, pants, a wide-brimmed hat, and sunglasses when outside. What should I know about heart disease, diabetes, and high blood pressure?  If you are 18-39 years of age, have your blood pressure checked every 3-5 years. If you are 40 years of age or older, have your blood pressure checked every year. You should have your blood pressure measured twice-once when you are at a hospital or clinic, and once when you are not at   a hospital or clinic. Record the average of the two measurements. To check your blood pressure when you are not at a hospital or clinic, you can use:  An automated blood pressure machine at a pharmacy.  A home blood pressure monitor.  Talk to your health care provider about your target blood pressure.  If you are between 45-79 years old, ask your health care provider if you should take aspirin to prevent heart disease.  Have regular diabetes screenings by checking your fasting blood sugar  level.  If you are at a normal weight and have a low risk for diabetes, have this test once every three years after the age of 45.  If you are overweight and have a high risk for diabetes, consider being tested at a younger age or more often.  A one-time screening for abdominal aortic aneurysm (AAA) by ultrasound is recommended for men aged 65-75 years who are current or former smokers. What should I know about preventing infection? Hepatitis B  If you have a higher risk for hepatitis B, you should be screened for this virus. Talk with your health care provider to find out if you are at risk for hepatitis B infection. Hepatitis C  Blood testing is recommended for:  Everyone born from 1945 through 1965.  Anyone with known risk factors for hepatitis C. Sexually Transmitted Diseases (STDs)  You should be screened each year for STDs including gonorrhea and chlamydia if:  You are sexually active and are younger than 81 years of age.  You are older than 81 years of age and your health care provider tells you that you are at risk for this type of infection.  Your sexual activity has changed since you were last screened and you are at an increased risk for chlamydia or gonorrhea. Ask your health care provider if you are at risk.  Talk with your health care provider about whether you are at high risk of being infected with HIV. Your health care provider may recommend a prescription medicine to help prevent HIV infection. What else can I do?  Schedule regular health, dental, and eye exams.  Stay current with your vaccines (immunizations).  Do not use any tobacco products, such as cigarettes, chewing tobacco, and e-cigarettes. If you need help quitting, ask your health care provider.  Limit alcohol intake to no more than 2 drinks per day. One drink equals 12 ounces of beer, 5 ounces of wine, or 1 ounces of hard liquor.  Do not use street drugs.  Do not share needles.  Ask your health  care provider for help if you need support or information about quitting drugs.  Tell your health care provider if you often feel depressed.  Tell your health care provider if you have ever been abused or do not feel safe at home. This information is not intended to replace advice given to you by your health care provider. Make sure you discuss any questions you have with your health care provider. Document Released: 07/31/2007 Document Revised: 10/01/2015 Document Reviewed: 11/05/2014 Elsevier Interactive Patient Education  2017 Elsevier Inc.  

## 2016-04-15 NOTE — Progress Notes (Signed)
Subjective:   Johnathan [name] is a 83 y.o. male who presents for Medicare Annual/Subsequent preventive examination.  Review of Systems:  N/A  Cardiac Risk Factors include: advanced age (>50men, >31 women);dyslipidemia;hypertension;obesity (BMI >30kg/m2);male gender     Objective:    Vitals: BP (!) 150/78 (BP Location: Right Arm)   Pulse 64   Temp 98.1 F (36.7 C) (Oral)   Ht 5\' 5"  (1.651 m)   Wt 228 lb 6.4 oz (103.6 kg)   BMI 38.01 kg/m   Body mass index is 38.01 kg/m.  Tobacco History  Smoking Status  . Former Smoker  . Packs/day: 1.00  . Years: 30.00  . Types: Cigarettes  . Quit date: 02/16/2003  Smokeless Tobacco  . Never Used     Counseling given: Not Answered   Past Medical History:  Diagnosis Date  . Abdominal pain    INTERMITTENT  . ARMD (age related macular degeneration)   . Arthritis    hands  . Asthma   . Atherosclerosis of native arteries of the extremities, unspecified   . CAD (coronary artery disease)    3 vessel/ DR FATH CARDIOLOGIST GAVE CLEARANCE  . Colitis    HX OF  . Constipation   . COPD (chronic obstructive pulmonary disease) ([place])   . GERD (gastroesophageal reflux disease)   . Glaucoma    both eyes/ PATIENT CAN NOT SEE  . HOH (hard of hearing)   . Hyperlipidemia   . Hypertension   . Neuromuscular disorder (HCC)    numbness in feet  . Occlusion and stenosis of carotid artery without mention of cerebral infarction   . Prostate cancer ([place])   . Prostate pain   . Shortness of breath dyspnea   . TIA (transient ischemic attack)    hx of/ 10 yrs ago  . Wears dentures    upper and lower   Past Surgical History:  Procedure Laterality Date  . aortoiliac bypass   1986  . CAROTID ENDARTERECTOMY Right 2003  . CATARACT EXTRACTION    . COLONOSCOPY  02-06-13   Dr Candace Cruise  . COLONOSCOPY WITH PROPOFOL N/A 07/09/2015   Procedure: COLONOSCOPY WITH PROPOFOL;  Surgeon: Hulen Luster, MD;  Location: [place] [facility];  Service: Gastroenterology;   Laterality: N/A;  . CORONARY ARTERY BYPASS GRAFT  2000   x3  . HERNIA REPAIR  1992  . PROSTATE SURGERY  2001   Family History  Problem Relation Age of Onset  . Cirrhosis Mother     liver  . Heart attack Father   . Prostate cancer Paternal Uncle   . Kidney disease Neg Hx    History  Sexual Activity  . Sexual activity: Not on file    Outpatient Encounter Prescriptions as of 04/15/2016  Medication Sig  . amLODipine (NORVASC) 5 MG tablet TAKE 1 TABLET DAILY  . aspirin 325 MG EC tablet Take 325 mg by mouth daily.  . brimonidine (ALPHAGAN) 0.2 % ophthalmic solution Place 1 drop into both eyes 2 (two) times daily.   [name] esomeprazole (NEXIUM) 20 MG capsule TAKE 1 CAPSULE TWICE A DAY  . latanoprost (XALATAN) 0.005 % ophthalmic solution Place 1 drop into both eyes at bedtime.   [name] losartan (COZAAR) 25 MG tablet Take 1 tablet (25 mg total) by mouth daily.  . Polyethylene Glycol 3350 (MIRALAX PO) Take by mouth as needed.  . prednisoLONE acetate (PRED FORTE) 1 % ophthalmic suspension Place 1 drop into both eyes 4 (four) times daily.  . simvastatin (  ZOCOR) 20 MG tablet TAKE 1 TABLET DAILY  . olmesartan-hydrochlorothiazide (BENICAR HCT) 20-12.5 MG tablet Take by mouth.   No facility-administered encounter medications on file as of 04/15/2016.     Activities of Daily Living In your present state of health, do you have any difficulty performing the following activities: 04/15/2016  Hearing? Y  Vision? Y  Difficulty concentrating or making decisions? N  Walking or climbing stairs? N  Dressing or bathing? N  Doing errands, shopping? Y  Preparing Food and eating ? N  Using the Toilet? N  In the past six months, have you accidently leaked urine? Y  Do you have problems with loss of bowel control? N  Managing your Medications? N  Managing your Finances? N  Housekeeping or managing your Housekeeping? N  Some recent data might be hidden    Patient Care Team: Jerrol Banana., MD as PCP -  General (Family Medicine) Seeplaputhur Robinette Haines, MD (General Surgery) Hulen Luster, MD (Inactive) as Physician Assistant (Internal Medicine)   Assessment:     Exercise Activities and Dietary recommendations Current Exercise Habits: The patient does not participate in regular exercise at present, Exercise limited by: Other - see comments (no energy)  Goals    . Increase water intake          Starting 04/15/16, I will increase my water intake to 4-5 glasses a day.      Fall Risk Fall Risk  04/15/2016 01/08/2015 08/07/2014  Falls in the past year? No No No   Depression Screen PHQ 2/9 Scores 04/15/2016 01/08/2015 08/07/2014  PHQ - 2 Score 0 0 0    Cognitive Function        Immunization History  Administered Date(s) Administered  . Influenza, High Dose Seasonal PF 12/05/2014, 11/05/2015  . Pneumococcal Conjugate-13 12/17/2013  . Pneumococcal Polysaccharide-23 12/31/2003, 11/21/2012   Screening Tests Health Maintenance  Topic Date Due  . TETANUS/TDAP  12/26/2022  . INFLUENZA VACCINE  Completed  . PNA vac Low Risk Adult  Completed      Plan:  I have personally reviewed and addressed the Medicare Annual Wellness questionnaire and have noted the following in the patient's chart:  A. Medical and social history B. Use of alcohol, tobacco or illicit drugs  C. Current medications and supplements D. Functional ability and status E.  Nutritional status F.  Physical activity G. Advance directives H. List of other physicians I.  Hospitalizations, surgeries, and ER visits in previous 12 months J.  [place] such as hearing and vision if needed, cognitive and depression L. Referrals and appointments - none  In addition, I have reviewed and discussed with patient certain preventive protocols, quality metrics, and best practice recommendations. A written personalized care plan for preventive services as well as general preventive health recommendations were provided to  patient.  See attached scanned questionnaire for additional information.   Signed,  Fabio Neighbors, LPN Nurse Health Advisor   MD Recommendations: None. I have reviewed the health advisors note, was  available for consultation and I agree with documentation and plan. Miguel Aschoff MD [facility]

## 2016-05-23 ENCOUNTER — Encounter: Payer: Self-pay | Admitting: Emergency Medicine

## 2016-05-23 ENCOUNTER — Emergency Department: Payer: Medicare Other

## 2016-05-23 ENCOUNTER — Observation Stay
Admission: EM | Admit: 2016-05-23 | Discharge: 2016-05-24 | Disposition: A | Discharge status: Home / Self Care | Payer: Medicare Other | Attending: Internal Medicine | Admitting: Internal Medicine

## 2016-05-23 DIAGNOSIS — Z6833 Body mass index (BMI) 33.0-33.9, adult: Secondary | ICD-10-CM | POA: Diagnosis not present

## 2016-05-23 DIAGNOSIS — H8110 Benign paroxysmal vertigo, unspecified ear: Secondary | ICD-10-CM | POA: Diagnosis not present

## 2016-05-23 DIAGNOSIS — I6521 Occlusion and stenosis of right carotid artery: Secondary | ICD-10-CM | POA: Insufficient documentation

## 2016-05-23 DIAGNOSIS — I10 Essential (primary) hypertension: Secondary | ICD-10-CM | POA: Diagnosis not present

## 2016-05-23 DIAGNOSIS — J449 Chronic obstructive pulmonary disease, unspecified: Secondary | ICD-10-CM | POA: Diagnosis not present

## 2016-05-23 DIAGNOSIS — H9190 Unspecified hearing loss, unspecified ear: Secondary | ICD-10-CM | POA: Diagnosis not present

## 2016-05-23 DIAGNOSIS — Z951 Presence of aortocoronary bypass graft: Secondary | ICD-10-CM | POA: Insufficient documentation

## 2016-05-23 DIAGNOSIS — Z8249 Family history of ischemic heart disease and other diseases of the circulatory system: Secondary | ICD-10-CM | POA: Insufficient documentation

## 2016-05-23 DIAGNOSIS — I4892 Unspecified atrial flutter: Secondary | ICD-10-CM | POA: Insufficient documentation

## 2016-05-23 DIAGNOSIS — I701 Atherosclerosis of renal artery: Secondary | ICD-10-CM | POA: Insufficient documentation

## 2016-05-23 DIAGNOSIS — Z87891 Personal history of nicotine dependence: Secondary | ICD-10-CM | POA: Insufficient documentation

## 2016-05-23 DIAGNOSIS — Z8546 Personal history of malignant neoplasm of prostate: Secondary | ICD-10-CM | POA: Insufficient documentation

## 2016-05-23 DIAGNOSIS — H814 Vertigo of central origin: Secondary | ICD-10-CM

## 2016-05-23 DIAGNOSIS — G9389 Other specified disorders of brain: Secondary | ICD-10-CM | POA: Insufficient documentation

## 2016-05-23 DIAGNOSIS — E785 Hyperlipidemia, unspecified: Secondary | ICD-10-CM | POA: Diagnosis not present

## 2016-05-23 DIAGNOSIS — Z79899 Other long term (current) drug therapy: Secondary | ICD-10-CM | POA: Insufficient documentation

## 2016-05-23 DIAGNOSIS — M19041 Primary osteoarthritis, right hand: Secondary | ICD-10-CM | POA: Diagnosis not present

## 2016-05-23 DIAGNOSIS — E669 Obesity, unspecified: Secondary | ICD-10-CM | POA: Diagnosis not present

## 2016-05-23 DIAGNOSIS — G629 Polyneuropathy, unspecified: Secondary | ICD-10-CM | POA: Insufficient documentation

## 2016-05-23 DIAGNOSIS — Z880 Allergy status to penicillin: Secondary | ICD-10-CM | POA: Insufficient documentation

## 2016-05-23 DIAGNOSIS — I255 Ischemic cardiomyopathy: Secondary | ICD-10-CM | POA: Diagnosis not present

## 2016-05-23 DIAGNOSIS — Z7982 Long term (current) use of aspirin: Secondary | ICD-10-CM | POA: Insufficient documentation

## 2016-05-23 DIAGNOSIS — Z8379 Family history of other diseases of the digestive system: Secondary | ICD-10-CM | POA: Insufficient documentation

## 2016-05-23 DIAGNOSIS — I714 Abdominal aortic aneurysm, without rupture: Secondary | ICD-10-CM | POA: Insufficient documentation

## 2016-05-23 DIAGNOSIS — M19042 Primary osteoarthritis, left hand: Secondary | ICD-10-CM | POA: Diagnosis not present

## 2016-05-23 DIAGNOSIS — R739 Hyperglycemia, unspecified: Secondary | ICD-10-CM | POA: Insufficient documentation

## 2016-05-23 DIAGNOSIS — H348192 Central retinal vein occlusion, unspecified eye, stable: Secondary | ICD-10-CM | POA: Insufficient documentation

## 2016-05-23 DIAGNOSIS — R200 Anesthesia of skin: Secondary | ICD-10-CM | POA: Insufficient documentation

## 2016-05-23 DIAGNOSIS — I739 Peripheral vascular disease, unspecified: Secondary | ICD-10-CM | POA: Diagnosis present

## 2016-05-23 DIAGNOSIS — I2510 Atherosclerotic heart disease of native coronary artery without angina pectoris: Secondary | ICD-10-CM | POA: Diagnosis not present

## 2016-05-23 DIAGNOSIS — I712 Thoracic aortic aneurysm, without rupture: Secondary | ICD-10-CM | POA: Diagnosis not present

## 2016-05-23 DIAGNOSIS — Z8673 Personal history of transient ischemic attack (TIA), and cerebral infarction without residual deficits: Secondary | ICD-10-CM | POA: Insufficient documentation

## 2016-05-23 DIAGNOSIS — H8149 Vertigo of central origin, unspecified ear: Secondary | ICD-10-CM | POA: Diagnosis present

## 2016-05-23 DIAGNOSIS — H3500 Unspecified background retinopathy: Secondary | ICD-10-CM | POA: Insufficient documentation

## 2016-05-23 DIAGNOSIS — K219 Gastro-esophageal reflux disease without esophagitis: Secondary | ICD-10-CM | POA: Diagnosis present

## 2016-05-23 DIAGNOSIS — I672 Cerebral atherosclerosis: Secondary | ICD-10-CM | POA: Insufficient documentation

## 2016-05-23 DIAGNOSIS — H1810 Bullous keratopathy, unspecified eye: Secondary | ICD-10-CM | POA: Insufficient documentation

## 2016-05-23 DIAGNOSIS — G4700 Insomnia, unspecified: Secondary | ICD-10-CM | POA: Insufficient documentation

## 2016-05-23 DIAGNOSIS — N433 Hydrocele, unspecified: Secondary | ICD-10-CM | POA: Diagnosis not present

## 2016-05-23 DIAGNOSIS — K5900 Constipation, unspecified: Secondary | ICD-10-CM | POA: Insufficient documentation

## 2016-05-23 DIAGNOSIS — J45909 Unspecified asthma, uncomplicated: Secondary | ICD-10-CM | POA: Diagnosis not present

## 2016-05-23 DIAGNOSIS — H409 Unspecified glaucoma: Secondary | ICD-10-CM | POA: Insufficient documentation

## 2016-05-23 DIAGNOSIS — Z882 Allergy status to sulfonamides status: Secondary | ICD-10-CM | POA: Insufficient documentation

## 2016-05-23 DIAGNOSIS — I700 Atherosclerosis of aorta: Secondary | ICD-10-CM | POA: Insufficient documentation

## 2016-05-23 DIAGNOSIS — H3530 Unspecified macular degeneration: Secondary | ICD-10-CM | POA: Diagnosis not present

## 2016-05-23 DIAGNOSIS — I081 Rheumatic disorders of both mitral and tricuspid valves: Secondary | ICD-10-CM | POA: Insufficient documentation

## 2016-05-23 DIAGNOSIS — R42 Dizziness and giddiness: Secondary | ICD-10-CM

## 2016-05-23 DIAGNOSIS — M1990 Unspecified osteoarthritis, unspecified site: Secondary | ICD-10-CM | POA: Insufficient documentation

## 2016-05-23 DIAGNOSIS — Z8042 Family history of malignant neoplasm of prostate: Secondary | ICD-10-CM | POA: Insufficient documentation

## 2016-05-23 DIAGNOSIS — E7800 Pure hypercholesterolemia, unspecified: Secondary | ICD-10-CM | POA: Insufficient documentation

## 2016-05-23 LAB — URINALYSIS, COMPLETE (UACMP) WITH MICROSCOPIC
Bacteria, UA: NONE SEEN
Bilirubin Urine: NEGATIVE
Glucose, UA: NEGATIVE mg/dL
Hgb urine dipstick: NEGATIVE
Ketones, ur: NEGATIVE mg/dL
Leukocytes, UA: NEGATIVE
Nitrite: NEGATIVE
Protein, ur: NEGATIVE mg/dL
Specific Gravity, Urine: 1.017 (ref 1.005–1.030)
pH: 5 (ref 5.0–8.0)

## 2016-05-23 LAB — CBC
HCT: 44.9 % (ref 40.0–52.0)
Hemoglobin: 15.1 g/dL (ref 13.0–18.0)
MCH: 31 pg (ref 26.0–34.0)
MCHC: 33.6 g/dL (ref 32.0–36.0)
MCV: 92.3 fL (ref 80.0–100.0)
Platelets: 184 10*3/uL (ref 150–440)
RBC: 4.86 MIL/uL (ref 4.40–5.90)
RDW: 14.3 % (ref 11.5–14.5)
WBC: 5.4 10*3/uL (ref 3.8–10.6)

## 2016-05-23 LAB — BASIC METABOLIC PANEL
Anion gap: 7 (ref 5–15)
BUN: 27 mg/dL — ABNORMAL HIGH (ref 6–20)
CO2: 25 mmol/L (ref 22–32)
Calcium: 9 mg/dL (ref 8.9–10.3)
Chloride: 105 mmol/L (ref 101–111)
Creatinine, Ser: 1.52 mg/dL — ABNORMAL HIGH (ref 0.61–1.24)
GFR calc Af Amer: 45 mL/min — ABNORMAL LOW (ref >60)
GFR calc non Af Amer: 39 mL/min — ABNORMAL LOW (ref >60)
Glucose, Bld: 91 mg/dL (ref 65–99)
Potassium: 4.6 mmol/L (ref 3.5–5.1)
Sodium: 137 mmol/L (ref 135–145)

## 2016-05-23 LAB — TROPONIN I: Troponin I: <0.03 ng/mL (ref <0.03)

## 2016-05-23 MED ORDER — ENOXAPARIN SODIUM 40 MG/0.4ML ~~LOC~~ SOLN: 40.0000 mg | SUBCUTANEOUS | Status: DC

## 2016-05-23 MED ORDER — PREDNISOLONE ACETATE 1 % OP SUSP
1.0000 [drp] | Freq: Every day | OPHTHALMIC | Status: DC
Start: 2016-05-24 — End: 2016-05-24
  Administered 2016-05-24: 1 [drp] via OPHTHALMIC
  Filled 2016-05-23: qty 1

## 2016-05-23 MED ORDER — SIMVASTATIN 20 MG PO TABS
20.0000 mg | ORAL_TABLET | Freq: Every day | ORAL | Status: DC
  Administered 2016-05-24: 20 mg via ORAL
  Filled 2016-05-23: qty 1

## 2016-05-23 MED ORDER — VALACYCLOVIR HCL 1 G PO TABS: 1000.0000 mg | ORAL_TABLET | Freq: Three times a day (TID) | ORAL | 0 refills | Status: AC

## 2016-05-23 MED ORDER — STROKE: EARLY STAGES OF RECOVERY BOOK
Freq: Once | Status: AC
  Administered 2016-05-23

## 2016-05-23 MED ORDER — DIPHENHYDRAMINE HCL 25 MG PO CAPS
25.0000 mg | ORAL_CAPSULE | Freq: Every evening | ORAL | Status: DC | PRN
  Administered 2016-05-23: 25 mg via ORAL
  Filled 2016-05-23: qty 1

## 2016-05-23 MED ORDER — BRIMONIDINE TARTRATE 0.2 % OP SOLN
1.0000 [drp] | Freq: Two times a day (BID) | OPHTHALMIC | Status: DC
  Administered 2016-05-23 – 2016-05-24 (×2): 1 [drp] via OPHTHALMIC
  Filled 2016-05-23: qty 5

## 2016-05-23 MED ORDER — ASPIRIN 81 MG PO CHEW
324.0000 mg | CHEWABLE_TABLET | Freq: Once | ORAL | Status: DC
  Filled 2016-05-23: qty 4

## 2016-05-23 MED ORDER — LATANOPROST 0.005 % OP SOLN
1.0000 [drp] | Freq: Every day | OPHTHALMIC | Status: DC
Start: 2016-05-23 — End: 2016-05-24
  Administered 2016-05-23: 1 [drp] via OPHTHALMIC
  Filled 2016-05-23: qty 2.5

## 2016-05-23 MED ORDER — ASPIRIN EC 325 MG PO TBEC
325.0000 mg | DELAYED_RELEASE_TABLET | Freq: Every day | ORAL | Status: DC
  Administered 2016-05-24: 12:00:00 325 mg via ORAL
  Filled 2016-05-23: qty 1

## 2016-05-23 MED ORDER — PANTOPRAZOLE SODIUM 40 MG PO TBEC
40.0000 mg | DELAYED_RELEASE_TABLET | Freq: Two times a day (BID) | ORAL | Status: DC
  Administered 2016-05-24: 12:00:00 40 mg via ORAL
  Filled 2016-05-23: qty 1

## 2016-05-23 NOTE — ED Notes (Signed)
Pt taken over to MRI

## 2016-05-23 NOTE — ED Provider Notes (Signed)
[name]  ____________________________________________   First MD Initiated Contact with Patient 05/23/16 1635     (approximate)  I have reviewed the triage vital signs and the nursing notes.   HISTORY  Chief Complaint Dizziness   HPI Johnathan [name] is a 83 y.o. male who comes to the emergency department with 48 hours of dizziness. He said it began when he woke up on Fridayand has persisted since then. He describes the dizziness as lightheadedness and a sense of not knowing where he is in space. He denies room spinning. He says it is worse with movement although not exclusively with movement. When he is walking down the hall of his home he has to hold the wall as he feels like he cannot fall down. He has a remote history of coronary artery disease with a CABG as well as previous TIAs.  07/17/15 Echo [facility]:  Technically difficult study due to chest wall/lung interference   Left ventricular hypertrophy - mild to moderate   Normal left ventricular systolic function, ejection fraction 55 to 20%   Diastolic dysfunction - grade II (elevated filling pressures)   Dilated left atrium - mild   Dilated ascending aorta   Aortic sclerosis   Normal right ventricular systolic function    Past Medical History:  Diagnosis Date  . Abdominal pain    INTERMITTENT  . ARMD (age related macular degeneration)   . Arthritis    hands  . Asthma   . Atherosclerosis of native arteries of the extremities, unspecified   . CAD (coronary artery disease)    3 vessel/ DR FATH CARDIOLOGIST GAVE CLEARANCE  . Colitis    HX OF  . Constipation   . COPD (chronic obstructive pulmonary disease) ([place])   . GERD (gastroesophageal reflux disease)   . Glaucoma    both eyes/ PATIENT CAN NOT SEE  . HOH (hard of hearing)   . Hyperlipidemia   . Hypertension   . Neuromuscular disorder (HCC)    numbness in feet  . Occlusion and stenosis of  carotid artery without mention of cerebral infarction   . Prostate cancer ([place])   . Prostate pain   . Shortness of breath dyspnea   . TIA (transient ischemic attack)    hx of/ 10 yrs ago  . Wears dentures    upper and lower    Patient Active Problem List   Diagnosis Date Noted  . Renal artery stenosis ([place]) 12/29/2015  . Aneurysm of thoracic aorta ([place]) 12/29/2015  . Superior mesenteric artery stenosis ([place]) 12/29/2015  . Hydrocele, bilateral 09/09/2014  . Hydrocele sac 08/22/2014  . History of prostate cancer 08/22/2014  . ASCVD (arteriosclerotic cardiovascular disease) 08/06/2014  . Cardiomyopathy, ischemic 08/06/2014  . Prostate cancer ([place]) 08/06/2014  . Obesity 08/06/2014  . Insomnia 08/06/2014  . Neuropathy ([place]) 08/06/2014  . Abdominal aortic aneurysm ([place]) 08/06/2014  . COPD (chronic obstructive pulmonary disease) ([place]) 08/06/2014  . Hyperglycemia 08/06/2014  . Acid reflux 08/05/2014  . Age-related macular degeneration 08/05/2014  . Branch retinal vein occlusion 08/05/2014  . 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 Hypercholesteremia 08/05/2014  . Pseudoaphakia 08/05/2014  . Secondary glaucoma 08/05/2014  . Osteoarthritis 08/05/2014  . Carotid stenosis 04/02/2014  . PAD (peripheral artery disease) ([place]) 04/02/2014  . 3-vessel CAD 11/06/2013  . Colonic stricture 02/21/2013  . BP (high blood pressure) 11/22/2012  . Kidney failure 11/21/2012  . BK (bullous keratopathy) 06/28/2012    Past Surgical History:  Procedure Laterality Date  . aortoiliac bypass   1986  . CAROTID ENDARTERECTOMY Right 2003  . CATARACT EXTRACTION    . COLONOSCOPY  02-06-13   Dr Candace Cruise  . COLONOSCOPY WITH PROPOFOL N/A 07/09/2015   Procedure: COLONOSCOPY WITH PROPOFOL;  Surgeon: Hulen Luster, MD;  Location: [place];  Service: Gastroenterology;  Laterality: N/A;  . CORONARY ARTERY BYPASS GRAFT  2000   x3  . HERNIA REPAIR  1992  . PROSTATE SURGERY  2001    Prior to Admission  medications   Medication Sig Start Date End Date Taking? Authorizing Provider  amLODipine (NORVASC) 5 MG tablet TAKE 1 TABLET DAILY 02/26/16  Yes Jerrol Banana., MD  aspirin 325 MG EC tablet Take 325 mg by mouth daily.   Yes Historical Provider, MD  brimonidine (ALPHAGAN) 0.2 % ophthalmic solution Place 1 drop into both eyes 2 (two) times daily.    Yes Historical Provider, MD  esomeprazole (NEXIUM) 20 MG capsule TAKE 1 CAPSULE TWICE A DAY 09/08/15  Yes Richard Maceo Pro., MD  latanoprost (XALATAN) 0.005 % ophthalmic solution Place 1 drop into both eyes at bedtime.  01/25/13  Yes Historical Provider, MD  Polyethylene Glycol 3350 (MIRALAX PO) Take by mouth as needed.   Yes Historical Provider, MD  prednisoLONE acetate (PRED FORTE) 1 % ophthalmic suspension Place 1 drop into both eyes 4 (four) times daily.   Yes Historical Provider, MD  simvastatin (ZOCOR) 20 MG tablet TAKE 1 TABLET DAILY 06/14/15  Yes Richard Maceo Pro., MD  losartan (COZAAR) 25 MG tablet Take 1 tablet (25 mg total) by mouth daily. 07/24/15   Richard Maceo Pro., MD  olmesartan-hydrochlorothiazide (BENICAR HCT) 20-12.5 MG tablet Take by mouth.    Historical Provider, MD  valACYclovir (VALTREX) 1000 MG tablet Take 1 tablet (1,000 mg total) by mouth 3 (three) times daily. 05/23/16 05/30/16  Darel Hong, MD    Allergies Sulfa antibiotics and Penicillins  Family History  Problem Relation Age of Onset  . Cirrhosis Mother     liver  . Heart attack Father   . Prostate cancer Paternal Uncle   . Kidney disease Neg Hx     Social History Social History  Substance Use Topics  . Smoking status: Former Smoker    Packs/day: 1.00    Years: 30.00    Types: Cigarettes    Quit date: 02/16/2003  . Smokeless tobacco: Never Used  . Alcohol use No    Review of Systems Constitutional: No fever/chills Eyes: No visual changes. ENT: No sore throat. Cardiovascular: Denies chest pain. Respiratory: Denies shortness of  breath. Gastrointestinal: No abdominal pain.  No nausea, no vomiting.  No diarrhea.  No constipation. Genitourinary: Negative for dysuria. Musculoskeletal: Negative for back pain. Skin: Negative for rash. Neurological: Negative for headaches, positive for vertigo  10-point ROS otherwise negative.  ____________________________________________   PHYSICAL EXAM:  VITAL SIGNS: ED Triage Vitals [05/23/16 1408]  Enc Vitals Group     BP      Pulse      Resp      Temp      Temp src      SpO2      Weight 223 lb (101.2 kg)     Height 5\' 8"  (1.727 m)     Head Circumference      Peak Flow      Pain Score      Pain Loc      Pain Edu?  Excl. in [place]?     Constitutional: Alert and oriented x 4 well appearing nontoxic no diaphoresis speaks in full, clear sentences Eyes: PERRL EOMI. Head: Atraumatic. Nose: No congestion/rhinnorhea. Mouth/Throat: No trismus Neck: No stridor.   Cardiovascular: Normal rate, regular rhythm. Grossly normal heart sounds.  Good peripheral circulation. Respiratory: Normal respiratory effort.  No retractions. Lungs CTAB and moving good air Gastrointestinal: Soft nondistended nontender no rebound no guarding no peritonitis no McBurney's tenderness negative Rovsing's no costovertebral tenderness negative Murphy's Musculoskeletal: No lower extremity edema   Neurologic:   Pupil equal round and reactive to light midrange and brisk on the right postsurgical on the left bilateral right beating nystagmus on rightward gaze Abnormal finger-nose-finger on the left no dysdiadochokinesis Positive Romberg with truncal ataxia 5 out of 5 strength throughout Skin:  Skin is warm, dry and intact. No rash noted. Psychiatric: Mood and affect are normal. Speech and behavior are normal.    ____________________________________________   DIFFERENTIAL  Metabolic drainage from, cardiac dysrhythmia, central vertigo, peripheral  vertigo ____________________________________________   LABS (all labs ordered are listed, but only abnormal results are displayed)  Labs Reviewed  BASIC METABOLIC PANEL - Abnormal; Notable for the following:       Result Value   BUN 27 (*)    Creatinine, Ser 1.52 (*)    GFR calc non Af Amer 39 (*)    GFR calc Af Amer 45 (*)    All other components within normal limits  URINALYSIS, COMPLETE (UACMP) WITH MICROSCOPIC - Abnormal; Notable for the following:    Color, Urine YELLOW (*)    APPearance CLEAR (*)    Squamous Epithelial / LPF 0-5 (*)    All other components within normal limits  CBC  TROPONIN I    CKD unchanged from baseline __________________________________________  EKG  ED ECG REPORT I, Darel Hong, the attending physician, personally viewed and interpreted this ECG.  Date: 05/23/2016 Rate: 63 Rhythm: Likely atrial flutter QRS Axis: normal Intervals: normal ST/T Wave abnormalities: normal Conduction Disturbances: none Narrative Interpretation: Wavy baseline makes interpretation difficult possibly atrial flutter with 4-1 conduction but no signs of acute ischemia at this time and no blocks  ____________________________________________  RADIOLOGY  Head CT with old left frontal infarct no acute disease ____________________________________________   PROCEDURES  Procedure(s) performed: no  Procedures  Critical Care performed: no  ____________________________________________   INITIAL IMPRESSION / ASSESSMENT AND PLAN / ED COURSE  Pertinent labs & imaging results that were available during my care of the patient were reviewed by me and considered in my medical decision making (see chart for details).  The patient has some right beating nystagmus as well as abnormal finger-nose-finger and truncal ataxia. His story is not completely classic for central vertigo, however given his age and comorbidities I'm concerned.      ----------------------------------------- 7:07 PM on 05/23/2016 -----------------------------------------  I discussed the case with the on-call neurologist from [place] Dr. Cheral Marker who recommends an MRI and MRA of the brain and inpatient admission for stroke workup. ____________________________________________   FINAL CLINICAL IMPRESSION(S) / ED DIAGNOSES  Final diagnoses:  Vertigo of central origin, unspecified laterality      NEW MEDICATIONS STARTED DURING THIS VISIT:  New Prescriptions   VALACYCLOVIR (VALTREX) 1000 MG TABLET    Take 1 tablet (1,000 mg total) by mouth 3 (three) times daily.     Note:  This document was prepared using Dragon voice recognition software and may include unintentional dictation errors.     Darel Hong,  MD 05/23/16 1918

## 2016-05-23 NOTE — ED Triage Notes (Signed)
Pt presents to ED. Pt c/o dizziness at this time. Pt denies any other symptoms, states "I'm dizzy and I feel like I'm going to fall". Pt is alert and oriented at this time, neurologically intact.

## 2016-05-23 NOTE — ED Notes (Signed)
Pt back from mri

## 2016-05-23 NOTE — ED Notes (Signed)
Pt reports dizziness for the past 3 days, denies any ne weakness, states that he had a tia a while back and has had weakness to the left side ever since, pt denies diff with swallowing

## 2016-05-23 NOTE — H&P (Signed)
[facility] NAME: Johnathan [name]    MR#:  [id number]  DATE OF BIRTH:  1925-11-22  DATE OF ADMISSION:  05/23/2016  PRIMARY CARE PHYSICIAN: Wilhemena Durie, MD   REQUESTING/REFERRING PHYSICIAN: Mable Paris, MD  CHIEF COMPLAINT:   Chief Complaint  Patient presents with  . Dizziness    HISTORY OF PRESENT ILLNESS:  Johnathan [name]  is a 83 y.o. male who presents with Loss of balance for the past 2 days. Patient states he feels that he cannot find his balance when he is up on his feet. He does not have symptoms when he is laying down. Initial workup in the ED was largely negative, telemetry neurology recommended admission for stroke workup. Hospitalists were called for admission.  PAST MEDICAL HISTORY:   Past Medical History:  Diagnosis Date  . Abdominal pain    INTERMITTENT  . ARMD (age related macular degeneration)   . Arthritis    hands  . Asthma   . Atherosclerosis of native arteries of the extremities, unspecified   . CAD (coronary artery disease)    3 vessel/ DR FATH CARDIOLOGIST GAVE CLEARANCE  . Colitis    HX OF  . Constipation   . COPD (chronic obstructive pulmonary disease) ([place])   . GERD (gastroesophageal reflux disease)   . Glaucoma    both eyes/ PATIENT CAN NOT SEE  . HOH (hard of hearing)   . Hyperlipidemia   . Hypertension   . Neuromuscular disorder (HCC)    numbness in feet  . Occlusion and stenosis of carotid artery without mention of cerebral infarction   . Prostate cancer ([place])   . Prostate pain   . Shortness of breath dyspnea   . TIA (transient ischemic attack)    hx of/ 10 yrs ago  . Wears dentures    upper and lower    PAST SURGICAL HISTORY:   Past Surgical History:  Procedure Laterality Date  . aortoiliac bypass   1986  . CAROTID ENDARTERECTOMY Right 2003  . CATARACT EXTRACTION    . COLONOSCOPY  02-06-13   Dr Candace Cruise  . COLONOSCOPY WITH PROPOFOL N/A 07/09/2015   Procedure: COLONOSCOPY WITH  PROPOFOL;  Surgeon: Hulen Luster, MD;  Location: [facility];  Service: Gastroenterology;  Laterality: N/A;  . CORONARY ARTERY BYPASS GRAFT  2000   x3  . HERNIA REPAIR  1992  . PROSTATE SURGERY  2001    SOCIAL HISTORY:   Social History  Substance Use Topics  . Smoking status: Former Smoker    Packs/day: 1.00    Years: 30.00    Types: Cigarettes    Quit date: 02/16/2003  . Smokeless tobacco: Never Used  . Alcohol use No    FAMILY HISTORY:   Family History  Problem Relation Age of Onset  . Cirrhosis Mother     liver  . Heart attack Father   . Prostate cancer Paternal Uncle   . Kidney disease Neg Hx     DRUG ALLERGIES:   Allergies  Allergen Reactions  . Sulfa Antibiotics Other (See Comments)    Other reaction(s): Other (See Comments)  . Penicillins Rash    MEDICATIONS AT HOME:   Prior to Admission medications   Medication Sig Start Date End Date Taking? Authorizing Provider  amLODipine (NORVASC) 5 MG tablet TAKE 1 TABLET DAILY 02/26/16  Yes Jerrol Banana., MD  aspirin 325 MG EC tablet Take 325 mg by mouth daily.  Yes Historical Provider, MD  brimonidine (ALPHAGAN) 0.2 % ophthalmic solution Place 1 drop into the right eye 2 (two) times daily.    Yes Historical Provider, MD  esomeprazole (NEXIUM) 20 MG capsule TAKE 1 CAPSULE TWICE A DAY 09/08/15  Yes Richard Maceo Pro., MD  latanoprost (XALATAN) 0.005 % ophthalmic solution Place 1 drop into both eyes at bedtime.  01/25/13  Yes Historical Provider, MD  losartan (COZAAR) 25 MG tablet Take 1 tablet (25 mg total) by mouth daily. 07/24/15  Yes Richard Maceo Pro., MD  Polyethylene Glycol 3350 (MIRALAX PO) Take by mouth as needed.   Yes Historical Provider, MD  prednisoLONE acetate (PRED FORTE) 1 % ophthalmic suspension Place 1 drop into the left eye daily.    Yes Historical Provider, MD  simvastatin (ZOCOR) 20 MG tablet TAKE 1 TABLET DAILY 06/14/15  Yes Richard Maceo Pro., MD  valACYclovir (VALTREX) 1000 MG  tablet Take 1 tablet (1,000 mg total) by mouth 3 (three) times daily. 05/23/16 05/30/16  Darel Hong, MD    REVIEW OF SYSTEMS:  Review of Systems  Constitutional: Negative for chills, fever, malaise/fatigue and weight loss.  HENT: Negative for ear pain, hearing loss and tinnitus.   Eyes: Negative for blurred vision, double vision, pain and redness.  Respiratory: Negative for cough, hemoptysis and shortness of breath.   Cardiovascular: Negative for chest pain, palpitations, orthopnea and leg swelling.  Gastrointestinal: Negative for abdominal pain, constipation, diarrhea, nausea and vomiting.  Genitourinary: Negative for dysuria, frequency and hematuria.  Musculoskeletal: Negative for back pain, joint pain and neck pain.  Skin:       No acne, rash, or lesions  Neurological: Negative for dizziness, tremors, focal weakness and weakness.       Persistent disequilibrium  Endo/Heme/Allergies: Negative for polydipsia. Does not bruise/bleed easily.  Psychiatric/Behavioral: Negative for depression. The patient is not nervous/anxious and does not have insomnia.      VITAL SIGNS:   Vitals:   05/23/16 1408 05/23/16 1914 05/23/16 1920 05/23/16 1930  BP:  (!) 167/89  138/70  Pulse:  95  (!) 59  Temp:   98.3 F (36.8 C)   SpO2:  95%  98%  Weight: 101.2 kg (223 lb)     Height: 5\' 8"  (1.727 m)      Wt Readings from Last 3 Encounters:  05/23/16 101.2 kg (223 lb)  04/15/16 103.6 kg (228 lb 6.4 oz)  04/13/16 102.1 kg (225 lb)    PHYSICAL EXAMINATION:  Physical Exam  Vitals reviewed. Constitutional: He is oriented to person, place, and time. He appears well-developed and well-nourished. No distress.  HENT:  Head: Normocephalic and atraumatic.  Mouth/Throat: Oropharynx is clear and moist.  Eyes: Conjunctivae and EOM are normal. Pupils are equal, round, and reactive to light. No scleral icterus.  Neck: Normal range of motion. Neck supple. No JVD present. No thyromegaly present.   Cardiovascular: Normal rate, regular rhythm and intact distal pulses.  Exam reveals no gallop and no friction rub.   No murmur heard. Respiratory: Effort normal and breath sounds normal. No respiratory distress. He has no wheezes. He has no rales.  GI: Soft. Bowel sounds are normal. He exhibits no distension. There is no tenderness.  Musculoskeletal: Normal range of motion. He exhibits no edema.  No arthritis, no gout  Lymphadenopathy:    He has no cervical adenopathy.  Neurological: He is alert and oriented to person, place, and time. No cranial nerve deficit.  Neurologic: Cranial nerves II-XII intact,  Sensation intact to light touch/pinprick, 5/5 strength in all extremities, no dysarthria, no aphasia, no dysphagia, memory intact, finger to nose testing showed no abnormality, no pronator drift, Gait testing deferred.   Skin: Skin is warm and dry. No rash noted. No erythema.  Psychiatric: He has a normal mood and affect. His behavior is normal. Judgment and thought content normal.    LABORATORY PANEL:   CBC  Recent Labs Lab 05/23/16 1408  WBC 5.4  HGB 15.1  HCT 44.9  PLT 184   ------------------------------------------------------------------------------------------------------------------  Chemistries   Recent Labs Lab 05/23/16 1408  NA 137  K 4.6  CL 105  CO2 25  GLUCOSE 91  BUN 27*  CREATININE 1.52*  CALCIUM 9.0   ------------------------------------------------------------------------------------------------------------------  Cardiac Enzymes  Recent Labs Lab 05/23/16 1408  TROPONINI <0.03   ------------------------------------------------------------------------------------------------------------------  RADIOLOGY:  Ct Head Wo Contrast  Result Date: 05/23/2016 CLINICAL DATA:  Dizziness x1 week EXAM: CT HEAD WITHOUT CONTRAST TECHNIQUE: Contiguous axial images were obtained from the base of the skull through the vertex without intravenous contrast.  COMPARISON:  None. FINDINGS: Brain: No evidence of acute infarction, hemorrhage, hydrocephalus, extra-axial collection or mass lesion/mass effect. Encephalomalacic changes related to left frontal infarct (series 2/ image 17). Subcortical white matter and periventricular small vessel ischemic changes. Global cortical atrophy. Vascular: Intracranial atherosclerosis. Skull: Normal. Negative for fracture or focal lesion. Sinuses/Orbits: The visualized paranasal sinuses are essentially clear. The mastoid air cells are unopacified. Other: None. IMPRESSION: No evidence of acute intracranial abnormality. Old left frontal infarct. Atrophy with small vessel ischemic changes. Electronically Signed   By: Julian Hy M.D.   On: 05/23/2016 18:42    EKG:   Orders placed or performed during the hospital encounter of 05/23/16  . ED EKG  . ED EKG  . ED EKG  . ED EKG    IMPRESSION AND PLAN:  Principal Problem:   Dizziness - admit for stroke workup per stroke admission orders set, including MRI and neurology consult Active Problems:   PAD (peripheral artery disease) ([place]) - continue home meds   BP (high blood pressure) - hold home antihypertensives for now pending MRI scan   ASCVD (arteriosclerotic cardiovascular disease) - continue home meds   Acid reflux - home dose PPI  All the records are reviewed and case discussed with ED provider. Management plans discussed with the patient and/or family.  DVT PROPHYLAXIS: SubQ lovenox  GI PROPHYLAXIS: PPI  ADMISSION STATUS: Observation  CODE STATUS: Full Code Status History    This patient does not have a recorded code status. Please follow your organizational policy for patients in this situation.      TOTAL TIME TAKING CARE OF THIS PATIENT: 40 minutes.   Aivy Akter [place] 05/23/2016, 10:01 PM  [facility]  Office  [phone]  CC: Primary care physician; Wilhemena Durie, MD  Note:  This document was prepared using  Dragon voice recognition software and may include unintentional dictation errors.

## 2016-05-23 NOTE — ED Notes (Signed)
Pt returned from MRI and urine collected Lm edt

## 2016-05-23 NOTE — ED Notes (Signed)
Admitting dr at bedside.  

## 2016-05-24 ENCOUNTER — Observation Stay
Admit: 2016-05-24 | Discharge: 2016-05-24 | Disposition: A | Discharge status: Home / Self Care | Payer: Medicare Other | Attending: Internal Medicine | Admitting: Internal Medicine

## 2016-05-24 DIAGNOSIS — R42 Dizziness and giddiness: Secondary | ICD-10-CM

## 2016-05-24 LAB — ECHOCARDIOGRAM COMPLETE
Height: 68 in
Weight: 3510.4 oz

## 2016-05-24 LAB — LIPID PANEL
Cholesterol: 128 mg/dL (ref 0–200)
HDL: 29 mg/dL — ABNORMAL LOW (ref >40)
LDL Cholesterol: 75 mg/dL (ref 0–99)
Total CHOL/HDL Ratio: 4.4 RATIO
Triglycerides: 118 mg/dL (ref <150)
VLDL: 24 mg/dL (ref 0–40)

## 2016-05-24 MED ORDER — DIAZEPAM 2 MG PO TABS: 2.0000 mg | ORAL_TABLET | Freq: Three times a day (TID) | ORAL | 0 refills | Status: DC | PRN

## 2016-05-24 MED ORDER — DIAZEPAM 2 MG PO TABS
2.0000 mg | ORAL_TABLET | Freq: Three times a day (TID) | ORAL | Status: DC | PRN
  Administered 2016-05-24: 2 mg via ORAL
  Filled 2016-05-24: qty 1

## 2016-05-24 MED ORDER — MECLIZINE HCL 12.5 MG PO TABS
12.5000 mg | ORAL_TABLET | Freq: Three times a day (TID) | ORAL | Status: DC | PRN
  Administered 2016-05-24: 14:00:00 12.5 mg via ORAL
  Filled 2016-05-24 (×2): qty 1

## 2016-05-24 MED ORDER — MECLIZINE HCL 12.5 MG PO TABS: 12.5000 mg | ORAL_TABLET | Freq: Three times a day (TID) | ORAL | 0 refills | Status: DC | PRN

## 2016-05-24 NOTE — Evaluation (Signed)
Occupational Therapy Evaluation Patient Details Name: Johnathan [name] MRN: [id number] DOB: [birth year] Today's Date: 05/24/2016    History of Present Illness Johnathan [name]  is a 83 y.o. male who presents with Loss of balance for the past 2 days. Patient states he feels that he cannot find his balance when he is up on his feet. He does not have symptoms when he is laying down. Initial workup in the ED was largely negative, telemetry neurology recommended admission for stroke workup.  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     Clinical Impression   Pt is 83 year old male who presents with new onset of dizziness and LOB for last 2 days.  Pt seen for evaluation with son [name] and daughter [name] present.  He has macular degeneration and only sees images and shapes.  Rec assistance from low vision community resource, but patient did not feel he needed any assist.  He presents with dizziness sitting upright in bed and BP taken throughout as follows:   Sitting up in bed BP 136/61 with pulse 81 and O2 sats 95%  Sitting EOB BP 141/64 with pulse 61 and O2 sats 97% Standing BP 118/49 with pulse 73 and O2 sats 98% Sitting for 5 minutes EOB BP  144/69 with pulse 63 and O2 sats 97%  Dizziness present throughout session with slight increase standing but no swaying or LOB.  Pt able to bring leg up and over other leg to complete donning and doffing socks and rec he bring leg up in flexion when donning pants over feet while seated and not lean forward to floor.  Rec skilled OT services while in hospital to continue to work on modifications to make ADLs safer including balance training and functional mobility training.  Pt most likely will not need any OT after discharge unless dizziness and decreased BP continue.  Rec using a shower chair with back for bathing which was discussed with pt and his children.    .    Follow Up  Recommendations  No OT follow up    Equipment Recommendations  Tub/shower seat    Recommendations for Other Services       Precautions / Restrictions Precautions Precautions: Fall Precaution Comments: monitor for orhtostatic hypotension and dizziness Restrictions Weight Bearing Restrictions: No      Mobility Bed Mobility                  Transfers                      Balance                                           ADL either performed or assessed with clinical judgement   ADL Overall ADL's : Needs assistance/impaired Eating/Feeding: Independent;Set up   Grooming: Wash/dry hands;Wash/dry face;Oral care;Applying deodorant;Brushing hair;Independent;Set up           Upper Body Dressing : Independent;Set up   Lower Body Dressing: Set up;Min guard                       Vision Baseline Vision/History: Macular Degeneration Patient Visual Report: No change from baseline Additional Comments: decreased vision prior to admission due to macular degeneration and  mainly sees shadows and outlines of figures and faces     Perception     Praxis      Pertinent Vitals/Pain Pain Assessment: No/denies pain     Hand Dominance Right   Extremity/Trunk Assessment Upper Extremity Assessment Upper Extremity Assessment: Overall WFL for tasks assessed   Lower Extremity Assessment Lower Extremity Assessment: Defer to PT evaluation       Communication Communication Communication: HOH;No difficulties   Cognition Arousal/Alertness: Awake/alert Behavior During Therapy: WFL for tasks assessed/performed Overall Cognitive Status: Within Functional Limits for tasks assessed                                     General Comments       Exercises     Shoulder Instructions      Home Living Family/patient expects to be discharged to:: Private residence Living Arrangements: Children Available Help at Discharge:  Family Type of Home: Mobile home Home Access: Stairs to enter Technical brewer of Steps: 4 Entrance Stairs-Rails: Can reach both Home Layout: One level     Bathroom Shower/Tub: Tub/shower unit (pt has a shower stall but no water set up to it yet but has been using his son's tub with shower)   Bathroom Toilet: Standard Bathroom Accessibility: No   Home Equipment: None          Prior Functioning/Environment Level of Independence: Needs assistance  Gait / Transfers Assistance Needed: Pt was ambulating without any AD in mobile home; no cane or walker at home ADL's / Homemaking Assistance Needed: Pt was making his own meals which were eggs in morning and bologna sandwich for lunch and dinner and able to compensate for low vision per son's report who is there to supervise 24 hours a day. Assist for house cleaning and errands, groceries and MD appts            OT Problem List: Impaired vision/perception;Impaired balance (sitting and/or standing);Decreased activity tolerance;Decreased safety awareness      OT Treatment/Interventions: Self-care/ADL training;Patient/family education;Visual/perceptual remediation/compensation;Therapeutic activities;Balance training    OT Goals(Current goals can be found in the care plan section) Acute Rehab OT Goals Patient Stated Goal: "to go home" OT Goal Formulation: With patient/family Time For Goal Achievement: 06/07/16 Potential to Achieve Goals: Good ADL Goals Pt Will Perform Lower Body Dressing: with set-up;with modified independence;sit to/from stand (with no LOB standing or walking with AD) Pt Will Transfer to Toilet: with set-up;with modified independence;regular height toilet;stand pivot transfer (with no LOB)  OT Frequency: Min 1X/week   Barriers to D/C:            Co-evaluation              End of Session Nurse Communication: Other (comment) (pt asking about eye drops)  Activity Tolerance: Patient tolerated treatment  well Patient left: in bed;with call bell/phone within reach;with bed alarm set;with family/visitor present  OT Visit Diagnosis: Unsteadiness on feet (R26.81);Muscle weakness (generalized) (M62.81)                Time: 1000-1050 OT Time Calculation (min): 50 min Charges:  OT General Charges $OT Visit: 1 Procedure OT Evaluation $OT Eval Low Complexity: 1 Procedure OT Treatments $Self Care/Home Management : 23-37 mins G-Codes: OT G-codes **NOT FOR INPATIENT CLASS** Functional Limitation: Self care Self Care Current Status (W0981): At least 1 percent but less than 20 percent impaired, limited or restricted Self Care Goal  Status [id number]): At least 1 percent but less than 20 percent impaired, limited or restricted   Chrys Racer, OTR/L ascom [phone] 05/24/16, 1:33 PM

## 2016-05-24 NOTE — Progress Notes (Addendum)
Physical Therapy Evaluation Patient Details Name: Johnathan [name] MRN: [id number] DOB: 27-Mar-1925 Today's Date: 05/24/2016   History of Present Illness  Pt is an 83y/o male presenting to hospital with dizziness and LOB over last few days. Pt reports dizziness increases during sit to stand and imbalance incr. during amb.  Clinical Impression  Pt agreeable to PT services today. Pt's significant PMH includes: HTN, hx of prostate CA, glaucoma in B eyes, pt reported L eye macular degeneration, CAD, and COPD. Pt's family (son and dtr) present during exam. Pt does not use AD during amb. At home and denied falling in the last 6 months. Pt's decrease in BP during OT eval was significant for orthostatic hypotension. Pt reported dizziness during STS txfs (6/10), it did not incr. With amb. But postural sway did incr. During amb. Without RW. Pt's dizziness was worse during vertical head turns vs. horizontal head turns. Pt was IND in bed mobility and required S during STS to ensure safety. Pt required min guard to S during amb. To ensure safety and cues for sequencing with RW. Pt's BLE strength WNL, except for 3+/5 R ankle dorsiflexion, Pt's BLE coordination WNL but pt noted to experience past pointing during L finger to nose testing. Pt reports past pointing due to impaired vision. Pt's SaO2 on room air remained between 93-96% during session and HR ranged in the 60's. PT recommending RW and HHPT or OPPT to addressed balance, coordination, strength, and dizziness impairments. Pt would benefit from skilled PT to address above deficits and promote optimal return to PLOF.   Follow Up Recommendations Home health PT;Outpatient PT (pt deciding between HHPT/OPPT with family)    Equipment Recommendations  Rolling walker with 5" wheels    Recommendations for Other Services       Precautions / Restrictions Precautions Precautions: Fall Precaution Comments: monitor for orhtostatic hypotension and  dizziness Restrictions Weight Bearing Restrictions: No      Mobility  Bed Mobility Overal bed mobility: Independent                Transfers Overall transfer level: Needs assistance Equipment used: Rolling walker (2 wheeled) Transfers: Sit to/from [facility] Sit to Stand: Supervision Stand pivot transfers: Supervision       General transfer comment: S to ensure safety 2/2 c/o dizziness during STS txfs.  Ambulation/Gait Ambulation/Gait assistance: Min guard;Supervision Ambulation Distance (Feet): 150 Feet Assistive device: Rolling walker (2 wheeled);None Gait Pattern/deviations: Step-through pattern;Decreased stride length;Decreased dorsiflexion - right;Trunk flexed;Wide base of support     General Gait Details: Pt amb. 40'x2 with RW with S, PT then had pt amb. 32' without RW but using hallway handrail as needed-as pt does not use device at home. Pt noted to experience incr. postural sway during amb. without RW.   Stairs            Wheelchair Mobility    Modified Rankin (Stroke Patients Only)       Balance Overall balance assessment: Needs assistance Sitting-balance support: Feet unsupported;No upper extremity supported Sitting balance-Leahy Scale: Normal Sitting balance - Comments: No postural sway or LOB during static and dynamic activities   Standing balance support: Bilateral upper extremity supported;No upper extremity supported Standing balance-Leahy Scale: Fair Standing balance comment: Fair without UE support, good with UE support on RW                             Pertinent Vitals/Pain Pain Assessment: No/denies pain  Home Living Family/patient expects to be discharged to:: Private residence Living Arrangements: Children Margaretha Glassing) Available Help at Discharge: Family Type of Home: Mobile home Home Access: Stairs to enter Entrance Stairs-Rails: Can reach both Entrance Stairs-Number of Steps: 4 Home Layout:  One level Home Equipment: None      Prior Function Level of Independence: Needs assistance   Gait / Transfers Assistance Needed: Pt was ambulating without any AD in mobile home; no cane or walker at home  ADL's / Homemaking Assistance Needed: Per OT eval: Pt was making his own meals which were eggs in morning and bologna sandwich for lunch and dinner and able to compensate for low vision per son's report who is there to supervise 24 hours a day. Assist for house cleaning and errands, groceries and MD appts        Hand Dominance   Dominant Hand: Right    Extremity/Trunk Assessment   Upper Extremity Assessment Upper Extremity Assessment: Defer to OT evaluation    Lower Extremity Assessment Lower Extremity Assessment: RLE deficits/detail;LLE deficits/detail RLE Deficits / Details: RLE strength 4+/5, except for 3+/5 R ankle DF.  RLE Coordination:  (WNL) LLE Deficits / Details: LLE strength 4+/5       Communication   Communication: HOH;No difficulties  Cognition Arousal/Alertness: Awake/alert Behavior During Therapy: WFL for tasks assessed/performed Overall Cognitive Status: Within Functional Limits for tasks assessed                                        General Comments      Exercises     Assessment/Plan    PT Assessment Patient needs continued PT services  PT Problem List Decreased strength;Decreased balance;Decreased mobility;Decreased coordination;Decreased knowledge of use of DME       PT Treatment Interventions DME instruction;Gait training;Stair training;Functional mobility training;Therapeutic activities;Therapeutic exercise;Balance training;Neuromuscular re-education;Patient/family education    PT Goals (Current goals can be found in the Care Plan section)  Acute Rehab PT Goals Patient Stated Goal: "to go home" PT Goal Formulation: With patient Time For Goal Achievement: 06/07/16 Potential to Achieve Goals: Good    Frequency  7X/week   Barriers to discharge        Co-evaluation               End of Session Equipment Utilized During Treatment: Gait belt Activity Tolerance: Patient tolerated treatment well Patient left: in chair;with family/visitor present;with chair alarm set;with call bell/phone within reach Nurse Communication: Mobility status (spoke with Hassan Rowan re: HHPT/OPPT) PT Visit Diagnosis: Unsteadiness on feet (R26.81);Other abnormalities of gait and mobility (R26.89);Dizziness and giddiness (R42)    Time: 2122-4825 PT Time Calculation (min) (ACUTE ONLY): 20 min   Charges:   PT Evaluation $PT Eval Low Complexity: 1 Procedure PT Treatments $Gait Training: 8-22 mins   PT G Codes:   PT G-Codes **NOT FOR INPATIENT CLASS** Functional Assessment Tool Used: AM-PAC 6 Clicks Basic Mobility Functional Limitation: Mobility: Walking and moving around Mobility: Walking and Moving Around Current Status (O0370): At least 20 percent but less than 40 percent impaired, limited or restricted Mobility: Walking and Moving Around Goal Status [phone]): At least 1 percent but less than 20 percent impaired, limited or restricted    Geoffry Paradise, PT,DPT 05/24/16 11:45 AM    Desteni Piscopo L 05/24/2016, 11:37 AM

## 2016-05-24 NOTE — Consult Note (Signed)
Reason for Consult:loss of balance  Referring Physician: Dr. Tressia Miners   CC: Positional loss of balance   HPI: Johnathan [name] is an 83 y.o. male  who presents with Loss of balance for the past 2 days. Patient states he feels that he cannot find his balance when he is up on his feet. He does not have symptoms when he is laying down.On examination pt's symptoms are clearly positional better with sitting/laying.    Past Medical History:  Diagnosis Date  . Abdominal pain    INTERMITTENT  . ARMD (age related macular degeneration)   . Arthritis    hands  . Asthma   . Atherosclerosis of native arteries of the extremities, unspecified   . CAD (coronary artery disease)    3 vessel/ DR FATH CARDIOLOGIST GAVE CLEARANCE  . Colitis    HX OF  . Constipation   . COPD (chronic obstructive pulmonary disease) ([place])   . GERD (gastroesophageal reflux disease)   . Glaucoma    both eyes/ PATIENT CAN NOT SEE  . HOH (hard of hearing)   . Hyperlipidemia   . Hypertension   . Neuromuscular disorder (HCC)    numbness in feet  . Occlusion and stenosis of carotid artery without mention of cerebral infarction   . Prostate cancer ([place])   . Prostate pain   . Shortness of breath dyspnea   . TIA (transient ischemic attack)    hx of/ 10 yrs ago  . Wears dentures    upper and lower    Past Surgical History:  Procedure Laterality Date  . aortoiliac bypass   1986  . CAROTID ENDARTERECTOMY Right 2003  . CATARACT EXTRACTION    . COLONOSCOPY  02-06-13   Dr Candace Cruise  . COLONOSCOPY WITH PROPOFOL N/A 07/09/2015   Procedure: COLONOSCOPY WITH PROPOFOL;  Surgeon: Hulen Luster, MD;  Location: [facility];  Service: Gastroenterology;  Laterality: N/A;  . CORONARY ARTERY BYPASS GRAFT  2000   x3  . HERNIA REPAIR  1992  . PROSTATE SURGERY  2001    Family History  Problem Relation Age of Onset  . Cirrhosis Mother     liver  . Heart attack Father   . Prostate cancer Paternal Uncle   . Kidney disease Neg Hx      Social History:  reports that he quit smoking about 13 years ago. His smoking use included Cigarettes. He has a 30.00 pack-year smoking history. He has never used smokeless tobacco. He reports that he does not drink alcohol or use drugs.  Allergies  Allergen Reactions  . Sulfa Antibiotics Other (See Comments)    Other reaction(s): Other (See Comments)  . Penicillins Rash    Medications: I have reviewed the patient's current medications.  ROS: History obtained from the patient  General ROS: negative for - chills, fatigue, fever, night sweats, weight gain or weight loss Psychological ROS: negative for - behavioral disorder, hallucinations, memory difficulties, mood swings or suicidal ideation Ophthalmic ROS: negative for - blurry vision, double vision, eye pain or loss of vision ENT ROS: negative for - epistaxis, nasal discharge, oral lesions, sore throat, tinnitus or vertigo Allergy and Immunology ROS: negative for - hives or itchy/watery eyes Hematological and Lymphatic ROS: negative for - bleeding problems, bruising or swollen lymph nodes Endocrine ROS: negative for - galactorrhea, hair pattern changes, polydipsia/polyuria or temperature intolerance Respiratory ROS: negative for - cough, hemoptysis, shortness of breath or wheezing Cardiovascular ROS: negative for - chest pain, dyspnea on exertion,  edema or irregular heartbeat Gastrointestinal ROS: negative for - abdominal pain, diarrhea, hematemesis, nausea/vomiting or stool incontinence Genito-Urinary ROS: negative for - dysuria, hematuria, incontinence or urinary frequency/urgency Musculoskeletal ROS: negative for - joint swelling or muscular weakness Neurological ROS: as noted in HPI Dermatological ROS: negative for rash and skin lesion changes  Physical Examination: Blood pressure (!) 144/69, pulse 70, temperature 97.6 F (36.4 C), temperature source Oral, resp. rate 20, height 5\' 8"  (1.727 m), weight 99.5 kg (219 lb 6.4  oz), SpO2 95 %.    Neurological Examination   Mental Status: Alert, oriented, thought content appropriate.  Speech fluent without evidence of aphasia.  Able to follow 3 step commands without difficulty. Cranial Nerves: II: Discs flat bilaterally; Visual fields grossly normal, pupils equal, round, reactive to light and accommodation III,IV, VI: ptosis not present, extra-ocular motions intact bilaterally V,VII: smile symmetric, facial light touch sensation normal bilaterally VIII: hearing normal bilaterally IX,X: gag reflex present XI: bilateral shoulder shrug XII: midline tongue extension Motor: Right : Upper extremity   5/5    Left:     Upper extremity   5/5  Lower extremity   5/5     Lower extremity   5/5 Tone and bulk:normal tone throughout; no atrophy noted Sensory: Pinprick and light touch intact throughout, bilaterally Deep Tendon Reflexes: 2+ and symmetric throughout Plantars: Right: downgoing   Left: downgoing Cerebellar: normal finger-to-nose, normal rapid alternating movements and normal heel-to-shin test Gait: not tested       Laboratory Studies:   Basic Metabolic Panel:  Recent Labs Lab 05/23/16 1408  NA 137  K 4.6  CL 105  CO2 25  GLUCOSE 91  BUN 27*  CREATININE 1.52*  CALCIUM 9.0    Liver Function Tests: No results for input(s): AST, ALT, ALKPHOS, BILITOT, PROT, ALBUMIN in the last 168 hours. No results for input(s): LIPASE, AMYLASE in the last 168 hours. No results for input(s): AMMONIA in the last 168 hours.  CBC:  Recent Labs Lab 05/23/16 1408  WBC 5.4  HGB 15.1  HCT 44.9  MCV 92.3  PLT 184    Cardiac Enzymes:  Recent Labs Lab 05/23/16 1408  TROPONINI <0.03    BNP: Invalid input(s): POCBNP  CBG: No results for input(s): GLUCAP in the last 168 hours.  Microbiology: No results found for this or any previous visit.  Coagulation Studies: No results for input(s): LABPROT, INR in the last 72 hours.  Urinalysis:  Recent  Labs Lab 05/23/16 1408  COLORURINE YELLOW*  LABSPEC 1.017  PHURINE 5.0  GLUCOSEU NEGATIVE  HGBUR NEGATIVE  BILIRUBINUR NEGATIVE  KETONESUR NEGATIVE  PROTEINUR NEGATIVE  NITRITE NEGATIVE  LEUKOCYTESUR NEGATIVE    Lipid Panel:     Component Value Date/Time   CHOL 128 05/24/2016 0511   CHOL 141 07/24/2015 0908   TRIG 118 05/24/2016 0511   HDL 29 (L) 05/24/2016 0511   HDL 38 (L) 07/24/2015 0908   CHOLHDL 4.4 05/24/2016 0511   VLDL 24 05/24/2016 0511   LDLCALC 75 05/24/2016 0511   LDLCALC 82 07/24/2015 0908    HgbA1C:  Lab Results  Component Value Date   HGBA1C 5.4 07/24/2015    Urine Drug Screen:  No results found for: LABOPIA, COCAINSCRNUR, LABBENZ, AMPHETMU, THCU, LABBARB  Alcohol Level: No results for input(s): ETH in the last 168 hours.  Other results: EKG: normal EKG, normal sinus rhythm, unchanged from previous tracings.  Imaging: Ct Head Wo Contrast  Result Date: 05/23/2016 CLINICAL DATA:  Dizziness x1 week EXAM:  CT HEAD WITHOUT CONTRAST TECHNIQUE: Contiguous axial images were obtained from the base of the skull through the vertex without intravenous contrast. COMPARISON:  None. FINDINGS: Brain: No evidence of acute infarction, hemorrhage, hydrocephalus, extra-axial collection or mass lesion/mass effect. Encephalomalacic changes related to left frontal infarct (series 2/ image 17). Subcortical white matter and periventricular small vessel ischemic changes. Global cortical atrophy. Vascular: Intracranial atherosclerosis. Skull: Normal. Negative for fracture or focal lesion. Sinuses/Orbits: The visualized paranasal sinuses are essentially clear. The mastoid air cells are unopacified. Other: None. IMPRESSION: No evidence of acute intracranial abnormality. Old left frontal infarct. Atrophy with small vessel ischemic changes. Electronically Signed   By: Julian Hy M.D.   On: 05/23/2016 18:42   Mr Angiogram Head Wo Contrast  Result Date: 05/23/2016 CLINICAL DATA:   Initial evaluation for acute vertigo. EXAM: MRI HEAD WITHOUT CONTRAST MRA HEAD WITHOUT CONTRAST TECHNIQUE: Multiplanar, multiecho pulse sequences of the brain and surrounding structures were obtained without intravenous contrast. Angiographic images of the head were obtained using MRA technique without contrast. COMPARISON:  Prior CT from earlier the same day. FINDINGS: MRI HEAD FINDINGS Brain: Diffuse prominence of the CSF containing spaces is compatible with generalized age-related cerebral atrophy. Patchy and confluent T2/FLAIR hyperintensity within the periventricular and deep white matter both cerebral hemispheres most consistent chronic microvascular changes. Superimposed areas of encephalomalacia of within the bilateral frontal lobes compatible with remote ischemic infarcts. Scatter remote lacunar infarcts present within the bilateral basal ganglia. Multiple remote bilateral cerebellar infarcts present. The no abnormal foci of restricted diffusion to suggest acute or subacute ischemia. Gray-white matter differentiation maintained. No evidence for acute or chronic intracranial hemorrhage. No mass lesion, midline shift or mass effect. No hydrocephalus. No extra-axial fluid collection. Major dural sinuses are grossly patent. Pituitary gland within normal limits. Vascular: Abnormal flow void within the right ICA, which may be related to slow flow and/ or occlusion. Major intracranial vascular flow voids otherwise maintained. Skull and upper cervical spine: Craniocervical junction within normal limits. Mild degenerative spondylolysis noted within the upper cervical spine without significant stenosis. Visualized bone marrow signal intensity within normal limits. No scalp soft tissue abnormality. Sinuses/Orbits: Globes and orbital soft tissues within normal limits. Patient status post lens extraction bilaterally. Mild scattered mucosal thickening throughout the paranasal sinuses. Retention cyst present within the  left max O sinus. No mastoid effusion. Inner ear structures grossly normal. Other: None. MRA HEAD FINDINGS ANTERIOR CIRCULATION: Right ICA not visualized to the level of the terminus, likely occluded. No significant flow within the right M1 segment. Minimal flow present within the distal right MCA branches, likely a lateral in nature. Distal cervical left ICA patent. Petrous, cavernous, and supraclinoid left ICA patent without flow-limiting stenosis. Left A1 segment patent. Anterior communicating artery patent. Left ACA patent to its distal aspect. Right ACA is diminutive as compared to the left with attenuated and irregular flow. ACA is are patent to their distal aspects. Left M1 segment patent without stenosis or occlusion. Distal left MCA branches well opacified. POSTERIOR CIRCULATION: Vertebral arteries patent to the vertebrobasilar junction. Multifocal atheromatous regularity noted within the V4 segments bilaterally without stenosis. Basilar artery widely patent. Superior cerebral arteries patent bilaterally. Both of the posterior cerebral arteries primarily supplied via the basilar artery. Right PCA patent to its distal aspect. Distal left P3 segments not visualized, which may be occluded. No aneurysm or vascular malformation. IMPRESSION: MRI HEAD IMPRESSION: 1. No acute intracranial infarct or other process identified. 2. Multifocal chronic ischemic infarcts involving the bilateral cerebral  and cerebellar hemispheres as above. 3. Abnormal flow void within the right ICA (see below). 4. Generalized age-related cerebral atrophy with moderate chronic microvascular ischemic disease. MRA HEAD IMPRESSION: 1. 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 Finding is presumably chronic. 2. Probable distal left P3 occlusion. 3. Patent but attenuated and irregular flow within the right ACA, supplied via the left internal carotid artery system. Electronically Signed   By: Jeannine Boga M.D.   On:  05/23/2016 22:11   Mr Brain Wo Contrast (neuro Protocol)  Result Date: 05/23/2016 CLINICAL DATA:  Initial evaluation for acute vertigo. EXAM: MRI HEAD WITHOUT CONTRAST MRA HEAD WITHOUT CONTRAST TECHNIQUE: Multiplanar, multiecho pulse sequences of the brain and surrounding structures were obtained without intravenous contrast. Angiographic images of the head were obtained using MRA technique without contrast. COMPARISON:  Prior CT from earlier the same day. FINDINGS: MRI HEAD FINDINGS Brain: Diffuse prominence of the CSF containing spaces is compatible with generalized age-related cerebral atrophy. Patchy and confluent T2/FLAIR hyperintensity within the periventricular and deep white matter both cerebral hemispheres most consistent chronic microvascular changes. Superimposed areas of encephalomalacia of within the bilateral frontal lobes compatible with remote ischemic infarcts. Scatter remote lacunar infarcts present within the bilateral basal ganglia. Multiple remote bilateral cerebellar infarcts present. The no abnormal foci of restricted diffusion to suggest acute or subacute ischemia. Gray-white matter differentiation maintained. No evidence for acute or chronic intracranial hemorrhage. No mass lesion, midline shift or mass effect. No hydrocephalus. No extra-axial fluid collection. Major dural sinuses are grossly patent. Pituitary gland within normal limits. Vascular: Abnormal flow void within the right ICA, which may be related to slow flow and/ or occlusion. Major intracranial vascular flow voids otherwise maintained. Skull and upper cervical spine: Craniocervical junction within normal limits. Mild degenerative spondylolysis noted within the upper cervical spine without significant stenosis. Visualized bone marrow signal intensity within normal limits. No scalp soft tissue abnormality. Sinuses/Orbits: Globes and orbital soft tissues within normal limits. Patient status post lens extraction bilaterally.  Mild scattered mucosal thickening throughout the paranasal sinuses. Retention cyst present within the left max O sinus. No mastoid effusion. Inner ear structures grossly normal. Other: None. MRA HEAD FINDINGS ANTERIOR CIRCULATION: Right ICA not visualized to the level of the terminus, likely occluded. No significant flow within the right M1 segment. Minimal flow present within the distal right MCA branches, likely a lateral in nature. Distal cervical left ICA patent. Petrous, cavernous, and supraclinoid left ICA patent without flow-limiting stenosis. Left A1 segment patent. Anterior communicating artery patent. Left ACA patent to its distal aspect. Right ACA is diminutive as compared to the left with attenuated and irregular flow. ACA is are patent to their distal aspects. Left M1 segment patent without stenosis or occlusion. Distal left MCA branches well opacified. POSTERIOR CIRCULATION: Vertebral arteries patent to the vertebrobasilar junction. Multifocal atheromatous regularity noted within the V4 segments bilaterally without stenosis. Basilar artery widely patent. Superior cerebral arteries patent bilaterally. Both of the posterior cerebral arteries primarily supplied via the basilar artery. Right PCA patent to its distal aspect. Distal left P3 segments not visualized, which may be occluded. No aneurysm or vascular malformation. IMPRESSION: MRI HEAD IMPRESSION: 1. No acute intracranial infarct or other process identified. 2. Multifocal chronic ischemic infarcts involving the bilateral cerebral and cerebellar hemispheres as above. 3. Abnormal flow void within the right ICA (see below). 4. Generalized age-related cerebral atrophy with moderate chronic microvascular ischemic disease. MRA HEAD IMPRESSION: 1. Occluded right ICA with absent flow  within the right M1 segment. Finding is presumably chronic. 2. Probable distal left P3 occlusion. 3. Patent but attenuated and irregular flow within the right ACA, supplied  via the left internal carotid artery system. Electronically Signed   By: Jeannine Boga M.D.   On: 05/23/2016 22:11     Assessment/Plan:  83 y.o. male  who presents with Loss of balance for the past 2 days. Patient states he feels that he cannot find his balance when he is up on his feet. He does not have symptoms when he is laying down.On examination pt's symptoms are clearly positional better with sitting/laying.     MRI brain no acute abnormality.  No flow in his R ICA and R M1 which is chronic in nature  Symptoms are due to inner ear, BPPV - meclizine/valium - d/c planning once symptoms improve - Neurology follow up as out pt   05/24/2016, 12:06 PM

## 2016-05-24 NOTE — Progress Notes (Signed)
*  PRELIMINARY RESULTS* Echocardiogram 2D Echocardiogram has been performed.  Sherrie Sport 05/24/2016, 9:06 AM

## 2016-05-24 NOTE — Evaluation (Addendum)
Clinical/Bedside Swallow Evaluation Patient Details  Name: Johnathan [name] MRN: [id number] Date of Birth: Nov 25, 1925  Today's Date: 05/24/2016 Time: SLP Start Time (ACUTE ONLY): 0900 SLP Stop Time (ACUTE ONLY): 1000 SLP Time Calculation (min) (ACUTE ONLY): 60 min  Past Medical History:  Past Medical History:  Diagnosis Date  . Abdominal pain    INTERMITTENT  . ARMD (age related macular degeneration)   . Arthritis    hands  . Asthma   . Atherosclerosis of native arteries of the extremities, unspecified   . CAD (coronary artery disease)    3 vessel/ DR FATH CARDIOLOGIST GAVE CLEARANCE  . Colitis    HX OF  . Constipation   . COPD (chronic obstructive pulmonary disease) ([place])   . GERD (gastroesophageal reflux disease)   . Glaucoma    both eyes/ PATIENT CAN NOT SEE  . HOH (hard of hearing)   . Hyperlipidemia   . Hypertension   . Neuromuscular disorder (HCC)    numbness in feet  . Occlusion and stenosis of carotid artery without mention of cerebral infarction   . Prostate cancer ([place])   . Prostate pain   . Shortness of breath dyspnea   . TIA (transient ischemic attack)    hx of/ 10 yrs ago  . Wears dentures    upper and lower   Past Surgical History:  Past Surgical History:  Procedure Laterality Date  . aortoiliac bypass   1986  . CAROTID ENDARTERECTOMY Right 2003  . CATARACT EXTRACTION    . COLONOSCOPY  02-06-13   Dr Candace Cruise  . COLONOSCOPY WITH PROPOFOL N/A 07/09/2015   Procedure: COLONOSCOPY WITH PROPOFOL;  Surgeon: Hulen Luster, MD;  Location: [facility];  Service: Gastroenterology;  Laterality: N/A;  . CORONARY ARTERY BYPASS GRAFT  2000   x3  . HERNIA REPAIR  1992  . PROSTATE SURGERY  2001   HPI:  Pt is a 83 y.o. male w/ multiple medical issues including GERD, HOH and neuromuscular disorder, HTN who presents with loss of balance for the past 2 days. Patient states he feels that he cannot find his balance when he is up on his feet. He does not have symptoms when he is  laying down. Initial workup in the ED was largely negative, telemetry neurology recommended admission for stroke workup. Currently, pt is sitting in bed post tests this morning. Verbally conversive; following all instruction. Oriented x3.    Assessment / Plan / Recommendation Clinical Impression   Pt appears at reduced risk for aspiration w/ thin liquids and solids when following general aspiration precautions. Pt consumed po trials of thin liquids via cup/straw and puree/soft solids w/ no change in vocal quality; no coughing or throat clearing. Oral phase appeared wfl for bolus management w/ the trials given. Timely swallow and clearing post swallow noted. Pt fed self given min prep d/t being in bed. Per pt and family report, suspect pt is at his baseline for swallowing. Discussed general aspiration precautions including sitting upright w/ any oral intake; eating/drinking slowly. Pt does use dentures when eating - instructed on the need to ensure a secure fit using adhesive if needed to aid mastication. Pt/family agreed. Pt seen for BSE initially d/t NPO status and NSG's request. In regard to any cognitive-linguistic issues, pt and family denied any deficits w/ his speech or communication w/ others (family present); family agreed. Pt is A/O and following general instruction/answering questions re: self appropriately. Pt is min HOH which can impact the verbal communication  process if speakers are not aware. Pt to f/u w/ primary MD post discharge if he notes any deficits w/ his communication after returning home.  SLP Visit Diagnosis: Dysphagia, unspecified (R13.10)    Aspiration Risk   (reduced )    Diet Recommendation  Regular diet consistency; Thin liquids. General aspiration precautions.   Medication Administration: Whole meds with liquid (as tolerates)    Other  Recommendations Recommended Consults:  (none) Oral Care Recommendations: Oral care BID;Patient independent with oral care Other  Recommendations:  (n/a)   Follow up Recommendations None      Frequency and Duration  (n/a)   (n/a)       Prognosis Prognosis for Safe Diet Advancement: Good Barriers to Reach Goals:  (none noted)      Swallow Study   General Date of Onset: 05/23/16 HPI: Pt is a 83 y.o. male w/ multiple medical issues including GERD, HOH and neuromuscular disorder, HTN who presents with loss of balance for the past 2 days. Patient states he feels that he cannot find his balance when he is up on his feet. He does not have symptoms when he is laying down. Initial workup in the ED was largely negative, telemetry neurology recommended admission for stroke workup. Currently, pt is sitting in bed post tests this morning. Verbally conversive; following all instruction. Oriented x3.  Type of Study: Bedside Swallow Evaluation Previous Swallow Assessment: none Diet Prior to this Study: Regular;Thin liquids Temperature Spikes Noted: No (wbc not elevated) Respiratory Status: Room air History of Recent Intubation: No Behavior/Cognition: Alert;Cooperative;Pleasant mood (HOH) Oral Cavity Assessment: Within Functional Limits Oral Care Completed by SLP: Recent completion by staff Oral Cavity - Dentition: Dentures, top;Dentures, bottom Vision: Functional for self-feeding Self-Feeding Abilities: Able to feed self Patient Positioning: Upright in bed Baseline Vocal Quality: Normal Volitional Cough: Strong Volitional Swallow: Able to elicit    Oral/Motor/Sensory Function Overall Oral Motor/Sensory Function: Within functional limits   Ice Chips Ice chips: Within functional limits Presentation: Spoon (fed; 2 trials)   Thin Liquid Thin Liquid: Within functional limits Presentation: Cup;Self Fed;Straw (~6 ozs total)    Nectar Thick Nectar Thick Liquid: Not tested   Honey Thick Honey Thick Liquid: Not tested   Puree Puree: Within functional limits Presentation: Self Fed;Spoon (3 ozs)   Solid   GO   Solid:  Within functional limits (moistened slightly) Presentation: Self Fed    Functional Assessment Tool Used: clinical judgement Functional Limitations: Swallowing Swallow Current Status ([id number]): At least 1 percent but less than 20 percent impaired, limited or restricted Swallow Goal Status [phone]): At least 1 percent but less than 20 percent impaired, limited or restricted Swallow Discharge Status [phone]): At least 1 percent but less than 20 percent impaired, limited or restricted    Orinda Kenner, MS, CCC-SLP Teleshia Lemere 05/24/2016,10:43 AM

## 2016-05-24 NOTE — Care Management (Signed)
Discharge to home today per Dr. Tressia Miners. Declining all services and equipment recommended in the home. Daughter will transport Shelbie Ammons RN MSN CCM Care Management [phone]

## 2016-05-24 NOTE — Progress Notes (Signed)
Discharge instructions given and went over with patient and daughter at bedside. Prescriptions given and reviewed. All questions answered. Patient discharged home with daughter and son via wheelchair by nursing staff. Madlyn Frankel, RN

## 2016-05-24 NOTE — Care Management Obs Status (Signed)
[place] NOTIFICATION   Patient Details  Name: Johnathan [name] MRN: [id number] Date of Birth: 1925-12-06   Medicare Observation Status Notification Given:  Yes    Shelbie Ammons, RN 05/24/2016, 8:26 AM

## 2016-05-24 NOTE — Discharge Summary (Signed)
[facility] NAME: Johnathan [name]    MR#:  [id number]  DATE OF BIRTH:  1925/06/05  DATE OF ADMISSION:  05/23/2016   ADMITTING PHYSICIAN: Lance Coon, MD  DATE OF DISCHARGE: 05/24/16  PRIMARY CARE PHYSICIAN: Wilhemena Durie, MD   ADMISSION DIAGNOSIS:   Vertigo of central origin, unspecified laterality [H81.49]  DISCHARGE DIAGNOSIS:   Principal Problem:   Dizziness Active Problems:   PAD (peripheral artery disease) (HCC)   Acid reflux   BP (high blood pressure)   ASCVD (arteriosclerotic cardiovascular disease)   COPD (chronic obstructive pulmonary disease) ([place])   SECONDARY DIAGNOSIS:   Past Medical History:  Diagnosis Date  . Abdominal pain    INTERMITTENT  . ARMD (age related macular degeneration)   . Arthritis    hands  . Asthma   . Atherosclerosis of native arteries of the extremities, unspecified   . CAD (coronary artery disease)    3 vessel/ DR FATH CARDIOLOGIST GAVE CLEARANCE  . Colitis    HX OF  . Constipation   . COPD (chronic obstructive pulmonary disease) ([place])   . GERD (gastroesophageal reflux disease)   . Glaucoma    both eyes/ PATIENT CAN NOT SEE  . HOH (hard of hearing)   . Hyperlipidemia   . Hypertension   . Neuromuscular disorder (HCC)    numbness in feet  . Occlusion and stenosis of carotid artery without mention of cerebral infarction   . Prostate cancer ([place])   . Prostate pain   . Shortness of breath dyspnea   . TIA (transient ischemic attack)    hx of/ 10 yrs ago  . Wears dentures    upper and lower    HOSPITAL COURSE:   83 year old male with past medical history significant for CAD, GERD, arthritis, hypertension, hyperlipidemia, glaucoma, COPD residence from home secondary to dizziness.  #1 dizziness-secondary to benign positional vertigo. MRI and MRA of the brain showing old infarcts and no acute findings. Cerebral atrophy noted. -Appreciate neurology consult. -Started on  meclizine and Valium when necessary to doses. -Home health recommended by physical therapy. Patient refusing home health and also walker at this time.  #2 hypertension-continue Norvasc. Hold losartan as soft blood pressures.  #3 glaucoma-with decreased visual activity. Continue eyedrops.  #4 GERD-continue Nexium  #5 hyperlipidemia-continue statin.  Patient will be discharged home today  DISCHARGE CONDITIONS:   Guarded  CONSULTS OBTAINED:   Treatment Team:  Leotis Pain, MD  DRUG ALLERGIES:   Allergies  Allergen Reactions  . Sulfa Antibiotics Other (See Comments)    Other reaction(s): Other (See Comments)  . Penicillins Rash   DISCHARGE MEDICATIONS:   Allergies as of 05/24/2016      Reactions   Sulfa Antibiotics Other (See Comments)   Other reaction(s): Other (See Comments)   Penicillins Rash      Medication List    STOP taking these medications   losartan 25 MG tablet Commonly known as:  COZAAR     TAKE these medications   amLODipine 5 MG tablet Commonly known as:  NORVASC TAKE 1 TABLET DAILY   aspirin 325 MG EC tablet Take 325 mg by mouth daily.   brimonidine 0.2 % ophthalmic solution Commonly known as:  ALPHAGAN Place 1 drop into the right eye 2 (two) times daily.   diazepam 2 MG tablet Commonly known as:  VALIUM Take 1 tablet (2 mg total) by mouth every 8 (eight) hours as needed (dizziness).  esomeprazole 20 MG capsule Commonly known as:  NEXIUM TAKE 1 CAPSULE TWICE A DAY   latanoprost 0.005 % ophthalmic solution Commonly known as:  XALATAN Place 1 drop into both eyes at bedtime.   meclizine 12.5 MG tablet Commonly known as:  ANTIVERT Take 1 tablet (12.5 mg total) by mouth 3 (three) times daily as needed for dizziness.   MIRALAX PO Take by mouth as needed.   prednisoLONE acetate 1 % ophthalmic suspension Commonly known as:  PRED FORTE Place 1 drop into the left eye daily.   simvastatin 20 MG tablet Commonly known as:  ZOCOR TAKE  1 TABLET DAILY   valACYclovir 1000 MG tablet Commonly known as:  VALTREX Take 1 tablet (1,000 mg total) by mouth 3 (three) times daily.        DISCHARGE INSTRUCTIONS:   1. PCP follow-up in 1-2 weeks  DIET:   Cardiac diet  ACTIVITY:   Activity as tolerated  OXYGEN:   Home Oxygen: No.  Oxygen Delivery: room air  DISCHARGE LOCATION:   home   If you experience worsening of your admission symptoms, develop shortness of breath, life threatening emergency, suicidal or homicidal thoughts you must seek medical attention immediately by calling 911 or calling your MD immediately  if symptoms less severe.  You Must read complete instructions/literature along with all the possible adverse reactions/side effects for all the Medicines you take and that have been prescribed to you. Take any new Medicines after you have completely understood and accpet all the possible adverse reactions/side effects.   Please note  You were cared for by a hospitalist during your hospital stay. If you have any questions about your discharge medications or the care you received while you were in the hospital after you are discharged, you can call the unit and asked to speak with the hospitalist on call if the hospitalist that took care of you is not available. Once you are discharged, your primary care physician will handle any further medical issues. Please note that NO REFILLS for any discharge medications will be authorized once you are discharged, as it is imperative that you return to your primary care physician (or establish a relationship with a primary care physician if you do not have one) for your aftercare needs so that they can reassess your need for medications and monitor your lab values.    On the day of Discharge:  VITAL SIGNS:   Blood pressure 135/62, pulse 63, temperature 97.7 F (36.5 C), temperature source Oral, resp. rate 20, height 5\' 8"  (1.727 m), weight 99.5 kg (219 lb 6.4 oz), SpO2  94 %.  PHYSICAL EXAMINATION:    GENERAL:  83 y.o.-year-old elderly patient lying in the bed with no acute distress.  EYES: Pupils equal, round, reactive to light and accommodation. No scleral icterus. Extraocular muscles intact.  HEENT: Head atraumatic, normocephalic. Oropharynx and nasopharynx clear.  NECK:  Supple, no jugular venous distention. No thyroid enlargement, no tenderness.  LUNGS: Normal breath sounds bilaterally, no wheezing, rales,rhonchi or crepitation. No use of accessory muscles of respiration.  CARDIOVASCULAR: S1, S2 normal. No  rubs, or gallops. 3/6 systolic murmur present ABDOMEN: Soft, non-tender, non-distended. Bowel sounds present. No organomegaly or mass.  EXTREMITIES: No pedal edema, cyanosis, or clubbing.  NEUROLOGIC: Cranial nerves II through XII are intact. Decreased visual acuity in both eyes. Muscle strength 5/5 in all extremities. Sensation intact. Gait not checked. Global weakness noted. PSYCHIATRIC: The patient is alert and oriented x 3.  SKIN: No obvious  rash, lesion, or ulcer.   DATA REVIEW:   CBC  Recent Labs Lab 05/23/16 1408  WBC 5.4  HGB 15.1  HCT 44.9  PLT 184    Chemistries   Recent Labs Lab 05/23/16 1408  NA 137  K 4.6  CL 105  CO2 25  GLUCOSE 91  BUN 27*  CREATININE 1.52*  CALCIUM 9.0     Microbiology Results  No results found for this or any previous visit.  RADIOLOGY:  Ct Head Wo Contrast  Result Date: 05/23/2016 CLINICAL DATA:  Dizziness x1 week EXAM: CT HEAD WITHOUT CONTRAST TECHNIQUE: Contiguous axial images were obtained from the base of the skull through the vertex without intravenous contrast. COMPARISON:  None. FINDINGS: Brain: No evidence of acute infarction, hemorrhage, hydrocephalus, extra-axial collection or mass lesion/mass effect. Encephalomalacic changes related to left frontal infarct (series 2/ image 17). Subcortical white matter and periventricular small vessel ischemic changes. Global cortical atrophy.  Vascular: Intracranial atherosclerosis. Skull: Normal. Negative for fracture or focal lesion. Sinuses/Orbits: The visualized paranasal sinuses are essentially clear. The mastoid air cells are unopacified. Other: None. IMPRESSION: No evidence of acute intracranial abnormality. Old left frontal infarct. Atrophy with small vessel ischemic changes. Electronically Signed   By: Julian Hy M.D.   On: 05/23/2016 18:42   Mr Angiogram Head Wo Contrast  Result Date: 05/23/2016 CLINICAL DATA:  Initial evaluation for acute vertigo. EXAM: MRI HEAD WITHOUT CONTRAST MRA HEAD WITHOUT CONTRAST TECHNIQUE: Multiplanar, multiecho pulse sequences of the brain and surrounding structures were obtained without intravenous contrast. Angiographic images of the head were obtained using MRA technique without contrast. COMPARISON:  Prior CT from earlier the same day. FINDINGS: MRI HEAD FINDINGS Brain: Diffuse prominence of the CSF containing spaces is compatible with generalized age-related cerebral atrophy. Patchy and confluent T2/FLAIR hyperintensity within the periventricular and deep white matter both cerebral hemispheres most consistent chronic microvascular changes. Superimposed areas of encephalomalacia of within the bilateral frontal lobes compatible with remote ischemic infarcts. Scatter remote lacunar infarcts present within the bilateral basal ganglia. Multiple remote bilateral cerebellar infarcts present. The no abnormal foci of restricted diffusion to suggest acute or subacute ischemia. Gray-white matter differentiation maintained. No evidence for acute or chronic intracranial hemorrhage. No mass lesion, midline shift or mass effect. No hydrocephalus. No extra-axial fluid collection. Major dural sinuses are grossly patent. Pituitary gland within normal limits. Vascular: Abnormal flow void within the right ICA, which may be related to slow flow and/ or occlusion. Major intracranial vascular flow voids otherwise maintained.  Skull and upper cervical spine: Craniocervical junction within normal limits. Mild degenerative spondylolysis noted within the upper cervical spine without significant stenosis. Visualized bone marrow signal intensity within normal limits. No scalp soft tissue abnormality. Sinuses/Orbits: Globes and orbital soft tissues within normal limits. Patient status post lens extraction bilaterally. Mild scattered mucosal thickening throughout the paranasal sinuses. Retention cyst present within the left max O sinus. No mastoid effusion. Inner ear structures grossly normal. Other: None. MRA HEAD FINDINGS ANTERIOR CIRCULATION: Right ICA not visualized to the level of the terminus, likely occluded. No significant flow within the right M1 segment. Minimal flow present within the distal right MCA branches, likely a lateral in nature. Distal cervical left ICA patent. Petrous, cavernous, and supraclinoid left ICA patent without flow-limiting stenosis. Left A1 segment patent. Anterior communicating artery patent. Left ACA patent to its distal aspect. Right ACA is diminutive as compared to the left with attenuated and irregular flow. ACA is are patent to their distal aspects. Left  M1 segment patent without stenosis or occlusion. Distal left MCA branches well opacified. POSTERIOR CIRCULATION: Vertebral arteries patent to the vertebrobasilar junction. Multifocal atheromatous regularity noted within the V4 segments bilaterally without stenosis. Basilar artery widely patent. Superior cerebral arteries patent bilaterally. Both of the posterior cerebral arteries primarily supplied via the basilar artery. Right PCA patent to its distal aspect. Distal left P3 segments not visualized, which may be occluded. No aneurysm or vascular malformation. IMPRESSION: MRI HEAD IMPRESSION: 1. No acute intracranial infarct or other process identified. 2. Multifocal chronic ischemic infarcts involving the bilateral cerebral and cerebellar hemispheres as  above. 3. Abnormal flow void within the right ICA (see below). 4. Generalized age-related cerebral atrophy with moderate chronic microvascular ischemic disease. MRA HEAD IMPRESSION: 1. 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 Finding is presumably chronic. 2. Probable distal left P3 occlusion. 3. Patent but attenuated and irregular flow within the right ACA, supplied via the left internal carotid artery system. Electronically Signed   By: Jeannine Boga M.D.   On: 05/23/2016 22:11   Mr Brain Wo Contrast (neuro Protocol)  Result Date: 05/23/2016 CLINICAL DATA:  Initial evaluation for acute vertigo. EXAM: MRI HEAD WITHOUT CONTRAST MRA HEAD WITHOUT CONTRAST TECHNIQUE: Multiplanar, multiecho pulse sequences of the brain and surrounding structures were obtained without intravenous contrast. Angiographic images of the head were obtained using MRA technique without contrast. COMPARISON:  Prior CT from earlier the same day. FINDINGS: MRI HEAD FINDINGS Brain: Diffuse prominence of the CSF containing spaces is compatible with generalized age-related cerebral atrophy. Patchy and confluent T2/FLAIR hyperintensity within the periventricular and deep white matter both cerebral hemispheres most consistent chronic microvascular changes. Superimposed areas of encephalomalacia of within the bilateral frontal lobes compatible with remote ischemic infarcts. Scatter remote lacunar infarcts present within the bilateral basal ganglia. Multiple remote bilateral cerebellar infarcts present. The no abnormal foci of restricted diffusion to suggest acute or subacute ischemia. Gray-white matter differentiation maintained. No evidence for acute or chronic intracranial hemorrhage. No mass lesion, midline shift or mass effect. No hydrocephalus. No extra-axial fluid collection. Major dural sinuses are grossly patent. Pituitary gland within normal limits. Vascular: Abnormal flow void within the right ICA, which may  be related to slow flow and/ or occlusion. Major intracranial vascular flow voids otherwise maintained. Skull and upper cervical spine: Craniocervical junction within normal limits. Mild degenerative spondylolysis noted within the upper cervical spine without significant stenosis. Visualized bone marrow signal intensity within normal limits. No scalp soft tissue abnormality. Sinuses/Orbits: Globes and orbital soft tissues within normal limits. Patient status post lens extraction bilaterally. Mild scattered mucosal thickening throughout the paranasal sinuses. Retention cyst present within the left max O sinus. No mastoid effusion. Inner ear structures grossly normal. Other: None. MRA HEAD FINDINGS ANTERIOR CIRCULATION: Right ICA not visualized to the level of the terminus, likely occluded. No significant flow within the right M1 segment. Minimal flow present within the distal right MCA branches, likely a lateral in nature. Distal cervical left ICA patent. Petrous, cavernous, and supraclinoid left ICA patent without flow-limiting stenosis. Left A1 segment patent. Anterior communicating artery patent. Left ACA patent to its distal aspect. Right ACA is diminutive as compared to the left with attenuated and irregular flow. ACA is are patent to their distal aspects. Left M1 segment patent without stenosis or occlusion. Distal left MCA branches well opacified. POSTERIOR CIRCULATION: Vertebral arteries patent to the vertebrobasilar junction. Multifocal atheromatous regularity noted within the V4 segments bilaterally without stenosis. Basilar artery widely  patent. Superior cerebral arteries patent bilaterally. Both of the posterior cerebral arteries primarily supplied via the basilar artery. Right PCA patent to its distal aspect. Distal left P3 segments not visualized, which may be occluded. No aneurysm or vascular malformation. IMPRESSION: MRI HEAD IMPRESSION: 1. No acute intracranial infarct or other process identified. 2.  Multifocal chronic ischemic infarcts involving the bilateral cerebral and cerebellar hemispheres as above. 3. Abnormal flow void within the right ICA (see below). 4. Generalized age-related cerebral atrophy with moderate chronic microvascular ischemic disease. MRA HEAD IMPRESSION: 1. 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 Finding is presumably chronic. 2. Probable distal left P3 occlusion. 3. Patent but attenuated and irregular flow within the right ACA, supplied via the left internal carotid artery system. Electronically Signed   By: Jeannine Boga M.D.   On: 05/23/2016 22:11     Management plans discussed with the patient, family and they are in agreement.  CODE STATUS:     Code Status Orders        Start     Ordered   05/23/16 2319  Full code  Continuous     05/23/16 2318    Code Status History    Date Active Date Inactive Code Status Order ID Comments User Context   This patient has a current code status but no historical code status.      TOTAL TIME TAKING CARE OF THIS PATIENT: 37 minutes.    Gladstone Lighter M.D on 05/24/2016 at 3:07 PM  Between 7am to 6pm - Pager - [phone]  After 6pm go to [email or web address] - password [email or web address] [facility]  [facility]  Office  [phone]  CC: Primary care physician; Wilhemena Durie, MD   Note: This dictation was prepared with Dragon dictation along with smaller phrase technology. Any transcriptional errors that result from this process are unintentional.

## 2016-05-24 NOTE — Care Management (Signed)
Admitted to this facility with the diagnosis of dizziness under observation status. Son, [name], lives with him. Daughter is Donivan Scull [phone]). Daughter says her father can't see very good. Next appointment with Dr, Rosanna Randy is scheduled for 06/18/2016. Prescriptions are filled at [facility] in [place]. No home health. No skilled facility. No home oxygen., no equipment in the home. Takes care of all basic activities of daily living himself, doesn't drive. Son and other family members helps with errands. No falls. Good appetite. Family will transport.  Stroke work-up in progress. Shelbie Ammons RN MSN CCM Care Management [phone]

## 2016-05-25 LAB — HEMOGLOBIN A1C
Hgb A1c MFr Bld: 5.5 % (ref 4.8–5.6)
Mean Plasma Glucose: 111 mg/dL

## 2016-05-26 ENCOUNTER — Inpatient Hospital Stay: Payer: Medicare Other | Admitting: Family Medicine

## 2016-06-01 ENCOUNTER — Ambulatory Visit
Admission: RE | Admit: 2016-06-01 | Discharge: 2016-06-01 | Disposition: A | Discharge status: Home / Self Care | Payer: Medicare Other | Source: Ambulatory Visit | Attending: Family Medicine | Admitting: Family Medicine

## 2016-06-01 ENCOUNTER — Ambulatory Visit (INDEPENDENT_AMBULATORY_CARE_PROVIDER_SITE_OTHER): Payer: Medicare Other | Admitting: Family Medicine

## 2016-06-01 ENCOUNTER — Encounter: Payer: Self-pay | Admitting: Family Medicine

## 2016-06-01 VITALS — BP 120/84 | HR 74 | Temp 98.1°F | Resp 16 | Wt 227.0 lb

## 2016-06-01 DIAGNOSIS — J439 Emphysema, unspecified: Secondary | ICD-10-CM | POA: Insufficient documentation

## 2016-06-01 DIAGNOSIS — R05 Cough: Secondary | ICD-10-CM | POA: Diagnosis not present

## 2016-06-01 DIAGNOSIS — G4700 Insomnia, unspecified: Secondary | ICD-10-CM

## 2016-06-01 DIAGNOSIS — R42 Dizziness and giddiness: Secondary | ICD-10-CM | POA: Insufficient documentation

## 2016-06-01 DIAGNOSIS — R059 Cough, unspecified: Secondary | ICD-10-CM

## 2016-06-01 DIAGNOSIS — J441 Chronic obstructive pulmonary disease with (acute) exacerbation: Secondary | ICD-10-CM

## 2016-06-01 MED ORDER — DOXYCYCLINE HYCLATE 100 MG PO TABS: 100.0000 mg | ORAL_TABLET | Freq: Two times a day (BID) | ORAL | 0 refills | Status: DC

## 2016-06-01 MED ORDER — TRAZODONE HCL 50 MG PO TABS: 25.0000 mg | ORAL_TABLET | Freq: Every evening | ORAL | 1 refills | Status: DC | PRN

## 2016-06-01 NOTE — Patient Instructions (Addendum)
Stop the Valium. Continue Meclizine for dizziness about 1-2 days.

## 2016-06-01 NOTE — Progress Notes (Signed)
Patient: Johnathan [name] Male    DOB: May 13, 1925   83 y.o.   MRN: [id number] Visit Date: 06/01/2016  Today's Provider: Wilhemena Durie, MD   Chief Complaint  Patient presents with  . Dizziness  . Cough   Subjective:    Cough  This is a new problem. The current episode started yesterday. Cough characteristics: mostly dry. Associated symptoms include headaches, nasal congestion, rhinorrhea, shortness of breath and wheezing. Pertinent negatives include no chest pain, chills, ear congestion, ear pain, fever, hemoptysis, postnasal drip, sore throat, sweats or weight loss. Treatments tried: OTC cough syrup. The treatment provided moderate relief. His past medical history is significant for COPD. There is no history of environmental allergies or pneumonia.       Follow up Hospitalization  Patient was admitted to [facility] on 05/23/2016 and discharged on 05/24/2016. He was treated for vertigo. Treatment for this included Meclizine and Valium. MRI and MRA showed old infarcts. There were no acute findings. He reports good compliance with treatment. He reports this condition is Improved. Pt denies any more dizzy spells. Pt's Losartan 25 mg was D/C in the hospital by neurology. BP is 120/84 in office today.  ------------------------------------------------------------------------------------     Allergies  Allergen Reactions  . Sulfa Antibiotics Other (See Comments)    Other reaction(s): Other (See Comments)  . Penicillins Rash     Current Outpatient Prescriptions:  .  amLODipine (NORVASC) 5 MG tablet, TAKE 1 TABLET DAILY, Disp: 90 tablet, Rfl: 3 .  aspirin 325 MG EC tablet, Take 325 mg by mouth daily., Disp: , Rfl:  .  brimonidine (ALPHAGAN) 0.2 % ophthalmic solution, Place 1 drop into the right eye 2 (two) times daily. , Disp: , Rfl:  .  diazepam (VALIUM) 2 MG tablet, Take 1 tablet (2 mg total) by mouth every 8 (eight) hours as needed (dizziness)., Disp: 30 tablet, Rfl: 0 .   esomeprazole (NEXIUM) 20 MG capsule, TAKE 1 CAPSULE TWICE A DAY, Disp: 180 capsule, Rfl: 2 .  latanoprost (XALATAN) 0.005 % ophthalmic solution, Place 1 drop into both eyes at bedtime. , Disp: , Rfl:  .  meclizine (ANTIVERT) 12.5 MG tablet, Take 1 tablet (12.5 mg total) by mouth 3 (three) times daily as needed for dizziness., Disp: 30 tablet, Rfl: 0 .  Polyethylene Glycol 3350 (MIRALAX PO), Take by mouth as needed., Disp: , Rfl:  .  prednisoLONE acetate (PRED FORTE) 1 % ophthalmic suspension, Place 1 drop into the left eye daily. , Disp: , Rfl:  .  simvastatin (ZOCOR) 20 MG tablet, TAKE 1 TABLET DAILY, Disp: 90 tablet, Rfl: 3  Review of Systems  Constitutional: Negative for chills, fever and weight loss.  HENT: Positive for rhinorrhea. Negative for ear pain, postnasal drip and sore throat.   Eyes: Positive for visual disturbance.       Chronic.  Respiratory: Positive for cough, shortness of breath and wheezing. Negative for hemoptysis.   Cardiovascular: Negative for chest pain.  Endocrine: Negative.   Allergic/Immunologic: Negative for environmental allergies.  Neurological: Positive for headaches.  Hematological: Negative.   Psychiatric/Behavioral: Negative.     Social History  Substance Use Topics  . Smoking status: Former Smoker    Packs/day: 1.00    Years: 30.00    Types: Cigarettes    Quit date: 02/16/2003  . Smokeless tobacco: Never Used     Comment: is exposed to second hand smoke  . Alcohol use No   Objective:   BP  120/84 (BP Location: Right Arm, Patient Position: Sitting, Cuff Size: Large)   Pulse 74   Temp 98.1 F (36.7 C) (Oral)   Resp 16   Wt 227 lb (103 kg)   SpO2 95%   BMI 34.52 kg/m  Vitals:   06/01/16 1128  BP: 120/84  Pulse: 74  Resp: 16  Temp: 98.1 F (36.7 C)  TempSrc: Oral  SpO2: 95%  Weight: 227 lb (103 kg)     Physical Exam  Constitutional: He appears well-developed and well-nourished.  HENT:  Head: Normocephalic and atraumatic.  Right  Ear: Tympanic membrane and external ear normal.  Left Ear: Tympanic membrane and external ear normal.  Nose: Nose normal.  Mouth/Throat: Oropharynx is clear and moist.  Eyes: Conjunctivae are normal.  Neck: Normal range of motion.  Cardiovascular: Normal rate and normal heart sounds.   Pulmonary/Chest: Effort normal and breath sounds normal. No respiratory distress.  Abdominal: Soft.  Skin: Skin is warm.  Psychiatric: He has a normal mood and affect. His behavior is normal. Judgment and thought content normal.        Assessment & Plan:     1. Vertigo Improved. D/C Valium. Continue Meclizine for 1-2 more days. Call for refills of Meclizine if needed.  2. COPD exacerbation ([place]) Start abx as below. Call if sx fail to improve. - doxycycline (VIBRA-TABS) 100 MG tablet; Take 1 tablet (100 mg total) by mouth 2 (two) times daily.  Dispense: 14 tablet; Refill: 0  3. Insomnia, unspecified type Start Trazodone as below. Call if sx fail to improve. - traZODone (DESYREL) 50 MG tablet; Take 0.5-1 tablets (25-50 mg total) by mouth at bedtime as needed for sleep.  Dispense: 90 tablet; Refill: 1  4. Cough Most likely due to COPD exacerbation. Will check CXR. FU pending results. - DG Chest 2 View  5.CAD Stable.    Patient seen and examined by Miguel Aschoff, MD, and note scribed by Renaldo Fiddler, CMA.  Robyn Galati Cranford Mon, MD  [facility]

## 2016-06-02 NOTE — Progress Notes (Signed)
Advised  ED 

## 2016-06-23 ENCOUNTER — Ambulatory Visit: Payer: Medicare Other | Admitting: Family Medicine

## 2016-06-23 ENCOUNTER — Other Ambulatory Visit: Payer: Self-pay | Admitting: Family Medicine

## 2016-07-07 ENCOUNTER — Encounter: Payer: Self-pay | Admitting: Family Medicine

## 2016-07-07 ENCOUNTER — Ambulatory Visit (INDEPENDENT_AMBULATORY_CARE_PROVIDER_SITE_OTHER): Payer: Medicare Other | Admitting: Family Medicine

## 2016-07-07 VITALS — BP 164/60 | HR 62 | Temp 97.6°F | Resp 16 | Wt 224.0 lb

## 2016-07-07 DIAGNOSIS — E7800 Pure hypercholesterolemia, unspecified: Secondary | ICD-10-CM

## 2016-07-07 DIAGNOSIS — I10 Essential (primary) hypertension: Secondary | ICD-10-CM | POA: Diagnosis not present

## 2016-07-07 MED ORDER — LOSARTAN POTASSIUM 25 MG PO TABS: 25.0000 mg | ORAL_TABLET | Freq: Every day | ORAL | 11 refills | Status: DC

## 2016-07-07 NOTE — Progress Notes (Signed)
Subjective:  HPI  Hypertension, follow-up:  BP Readings from Last 3 Encounters:  07/07/16 (!) 164/60  06/01/16 120/84  05/24/16 135/62    He was last seen for hypertension 6 months ago.  BP at that visit was 120/84. Management since that visit includes none. He reports good compliance with treatment. He is not having side effects.  He is not exercising. He is adherent to low salt diet.   Outside blood pressures are not being checked. He is experiencing dyspnea and fatigue.  Patient denies chest pain, chest pressure/discomfort, claudication, exertional chest pressure/discomfort, irregular heart beat, lower extremity edema, near-syncope, orthopnea, palpitations, paroxysmal nocturnal dyspnea, syncope and tachypnea.   Cardiovascular risk factors include advanced age (older than 3 for men, 53 for women), dyslipidemia, hypertension, male gender, sedentary lifestyle and smoking/ tobacco exposure.   Wt Readings from Last 3 Encounters:  07/07/16 224 lb (101.6 kg)  06/01/16 227 lb (103 kg)  05/23/16 219 lb 6.4 oz (99.5 kg)   ------------------------------------------------------------------------    Prior to Admission medications   Medication Sig Start Date End Date Taking? Authorizing Provider  amLODipine (NORVASC) 5 MG tablet TAKE 1 TABLET DAILY 02/26/16   Jerrol Banana., MD  aspirin 325 MG EC tablet Take 325 mg by mouth daily.    [provider]  brimonidine (ALPHAGAN) 0.2 % ophthalmic solution Place 1 drop into the right eye 2 (two) times daily.     [provider]  diazepam (VALIUM) 2 MG tablet Take 1 tablet (2 mg total) by mouth every 8 (eight) hours as needed (dizziness). 05/24/16   Gladstone Lighter, MD  doxycycline (VIBRA-TABS) 100 MG tablet Take 1 tablet (100 mg total) by mouth 2 (two) times daily. 06/01/16   Jerrol Banana., MD  esomeprazole (NEXIUM) 20 MG capsule TAKE 1 CAPSULE TWICE A DAY 06/23/16   Jerrol Banana., MD  latanoprost  (XALATAN) 0.005 % ophthalmic solution Place 1 drop into both eyes at bedtime.  01/25/13   [provider]  meclizine (ANTIVERT) 12.5 MG tablet Take 1 tablet (12.5 mg total) by mouth 3 (three) times daily as needed for dizziness. 05/24/16   Gladstone Lighter, MD  Polyethylene Glycol 3350 (MIRALAX PO) Take by mouth as needed.    [provider]  prednisoLONE acetate (PRED FORTE) 1 % ophthalmic suspension Place 1 drop into the left eye daily.     [provider]  simvastatin (ZOCOR) 20 MG tablet TAKE 1 TABLET DAILY 06/14/15   Jerrol Banana., MD  traZODone (DESYREL) 50 MG tablet Take 0.5-1 tablets (25-50 mg total) by mouth at bedtime as needed for sleep. 06/01/16   Jerrol Banana., MD    Patient Active Problem List   Diagnosis Date Noted  . Vertigo 06/01/2016  . Dizziness 05/23/2016  . Renal artery stenosis ([place]) 12/29/2015  . Aneurysm of thoracic aorta ([place]) 12/29/2015  . 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 Hydrocele, bilateral 09/09/2014  . Hydrocele sac 08/22/2014  . History of prostate cancer 08/22/2014  . ASCVD (arteriosclerotic cardiovascular disease) 08/06/2014  . Cardiomyopathy, ischemic 08/06/2014  . Prostate cancer ([place]) 08/06/2014  . Obesity 08/06/2014  . Insomnia 08/06/2014  . Neuropathy 08/06/2014  . Abdominal aortic aneurysm ([place]) 08/06/2014  . COPD (chronic obstructive pulmonary disease) ([place]) 08/06/2014  . Hyperglycemia 08/06/2014  . Acid reflux 08/05/2014  . Age-related macular degeneration 08/05/2014  . Branch retinal vein occlusion 08/05/2014  . 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 Hypercholesteremia 08/05/2014  .  Pseudoaphakia 08/05/2014  . Secondary glaucoma 08/05/2014  . Osteoarthritis 08/05/2014  . Carotid stenosis 04/02/2014  . PAD (peripheral artery disease) ([place]) 04/02/2014  . 3-vessel CAD 11/06/2013  . Colonic stricture ([place]) 02/21/2013  . BP (high blood pressure) 11/22/2012  . Kidney failure  11/21/2012  . BK (bullous keratopathy) 06/28/2012    Past Medical History:  Diagnosis Date  . Abdominal pain    INTERMITTENT  . ARMD (age related macular degeneration)   . Arthritis    hands  . Asthma   . Atherosclerosis of native arteries of the extremities, unspecified   . CAD (coronary artery disease)    3 vessel/ DR FATH CARDIOLOGIST GAVE CLEARANCE  . Colitis    HX OF  . Constipation   . COPD (chronic obstructive pulmonary disease) ([place])   . GERD (gastroesophageal reflux disease)   . Glaucoma    both eyes/ PATIENT CAN NOT SEE  . HOH (hard of hearing)   . Hyperlipidemia   . Hypertension   . Neuromuscular disorder (HCC)    numbness in feet  . Occlusion and stenosis of carotid artery without mention of cerebral infarction   . Prostate cancer ([place])   . Prostate pain   . Shortness of breath dyspnea   . TIA (transient ischemic attack)    hx of/ 10 yrs ago  . Wears dentures    upper and lower    Social History   Social History  . Marital status: Widowed    Spouse name: N/A  . Number of children: 4  . Years of education: N/A   Occupational History  . retired    Social History Main Topics  . Smoking status: Former Smoker    Packs/day: 1.00    Years: 30.00    Types: Cigarettes    Quit date: 02/16/2003  . Smokeless tobacco: Never Used     Comment: is exposed to second hand smoke  . Alcohol use No  . Drug use: No  . Sexual activity: Not Currently   Other Topics Concern  . Not on file   Social History Narrative  . No narrative on file    Allergies  Allergen Reactions  . Sulfa Antibiotics Other (See Comments)    Other reaction(s): Other (See Comments)  . Penicillins Rash    Review of Systems  Constitutional: Positive for malaise/fatigue.  HENT: Positive for congestion.        Runny nose  Eyes: Negative.   Respiratory: Positive for cough and shortness of breath (not new for pt).   Cardiovascular: Negative.   Gastrointestinal: Negative.     Genitourinary: Negative.   Musculoskeletal: Negative.   Skin: Negative.   Neurological: Negative.   Endo/Heme/Allergies: Positive for environmental allergies.  Psychiatric/Behavioral: Negative.     Immunization History  Administered Date(s) Administered  . Influenza, High Dose Seasonal PF 12/05/2014, 11/05/2015  . Pneumococcal Conjugate-13 12/17/2013  . Pneumococcal Polysaccharide-23 12/31/2003, 11/21/2012    Objective:  BP (!) 164/60 (BP Location: Left Arm, Patient Position: Sitting, Cuff Size: Large)   Pulse 62   Temp 97.6 F (36.4 C) (Oral)   Resp 16   Wt 224 lb (101.6 kg)   SpO2 98%   BMI 34.06 kg/m   Physical Exam  Constitutional: He is oriented to person, place, and time and well-developed, well-nourished, and in no distress.  Eyes: Conjunctivae and EOM are normal. Pupils are equal, round, and reactive to light.  Neck: Normal range of motion. Neck supple.  Cardiovascular: Normal rate, regular rhythm,  normal heart sounds and intact distal pulses.   Pulmonary/Chest: Effort normal and breath sounds normal.  Musculoskeletal: Normal range of motion.  Neurological: He is alert and oriented to person, place, and time. He has normal reflexes. Gait normal. GCS score is 15.  Skin: Skin is warm and dry.  Psychiatric: Mood, memory, affect and judgment normal.    Lab Results  Component Value Date   WBC 5.4 05/23/2016   HGB 15.1 05/23/2016   HCT 44.9 05/23/2016   PLT 184 05/23/2016   GLUCOSE 91 05/23/2016   CHOL 128 05/24/2016   TRIG 118 05/24/2016   HDL 29 (L) 05/24/2016   LDLCALC 75 05/24/2016   TSH 1.940 07/24/2015   HGBA1C 5.5 05/24/2016    CMP     Component Value Date/Time   NA 137 05/23/2016 1408   NA 143 01/12/2016 0922   NA 143 02/06/2013 0541   K 4.6 05/23/2016 1408   K 4.4 02/06/2013 0541   CL 105 05/23/2016 1408   CL 111 (H) 02/06/2013 0541   CO2 25 05/23/2016 1408   CO2 26 02/06/2013 0541   GLUCOSE 91 05/23/2016 1408   GLUCOSE 106 (H) 02/06/2013  0541   BUN 27 (H) 05/23/2016 1408   BUN 22 01/12/2016 0922   BUN 23 (H) 04/10/2013 0904   CREATININE 1.52 (H) 05/23/2016 1408   CREATININE 1.28 04/10/2013 0904   CALCIUM 9.0 05/23/2016 1408   CALCIUM 8.3 (L) 02/06/2013 0541   PROT 6.7 07/24/2015 0908   PROT 7.0 02/04/2013 2024   ALBUMIN 4.2 01/12/2016 0922   ALBUMIN 3.7 02/04/2013 2024   AST 14 07/24/2015 0908   AST 15 02/04/2013 2024   ALT 10 07/24/2015 0908   ALT 17 02/04/2013 2024   ALKPHOS 68 07/24/2015 0908   ALKPHOS 65 02/04/2013 2024   BILITOT 0.4 07/24/2015 0908   BILITOT 0.4 02/04/2013 2024   GFRNONAA 39 (L) 05/23/2016 1408   GFRNONAA 51 (L) 04/10/2013 0904   GFRAA 45 (L) 05/23/2016 1408   GFRAA 59 (L) 04/10/2013 0904    Assessment and Plan :  1. Essential hypertension Elevated. Restart losartan and follow up in 4 months.  - losartan (COZAAR) 25 MG tablet; Take 1 tablet (25 mg total) by mouth daily.  Dispense: 30 tablet; Refill: 11  2. Hypercholesteremia Stable.  3.CAD Risk factors treated.  HPI, Exam, and A&P Transcribed under the direction and in the presence of Baruc Tugwell L. Cranford Mon, MD  Electronically Signed: Katina Dung, [place] MD [facility] 07/07/2016 8:42 AM

## 2016-07-21 ENCOUNTER — Other Ambulatory Visit: Payer: Self-pay | Admitting: Family Medicine

## 2016-08-24 ENCOUNTER — Emergency Department
Admission: EM | Admit: 2016-08-24 | Discharge: 2016-08-24 | Disposition: A | Discharge status: Home / Self Care | Payer: Medicare Other | Attending: Emergency Medicine | Admitting: Emergency Medicine

## 2016-08-24 DIAGNOSIS — Z79899 Other long term (current) drug therapy: Secondary | ICD-10-CM | POA: Insufficient documentation

## 2016-08-24 DIAGNOSIS — I120 Hypertensive chronic kidney disease with stage 5 chronic kidney disease or end stage renal disease: Secondary | ICD-10-CM | POA: Insufficient documentation

## 2016-08-24 DIAGNOSIS — Z87891 Personal history of nicotine dependence: Secondary | ICD-10-CM | POA: Diagnosis not present

## 2016-08-24 DIAGNOSIS — N186 End stage renal disease: Secondary | ICD-10-CM | POA: Diagnosis not present

## 2016-08-24 DIAGNOSIS — R42 Dizziness and giddiness: Secondary | ICD-10-CM | POA: Diagnosis present

## 2016-08-24 DIAGNOSIS — I2510 Atherosclerotic heart disease of native coronary artery without angina pectoris: Secondary | ICD-10-CM | POA: Diagnosis not present

## 2016-08-24 DIAGNOSIS — Z7982 Long term (current) use of aspirin: Secondary | ICD-10-CM | POA: Insufficient documentation

## 2016-08-24 DIAGNOSIS — J45909 Unspecified asthma, uncomplicated: Secondary | ICD-10-CM | POA: Diagnosis not present

## 2016-08-24 HISTORY — DX: Dizziness and giddiness: R42

## 2016-08-24 LAB — CBC
HCT: 43.5 % (ref 40.0–52.0)
Hemoglobin: 14.6 g/dL (ref 13.0–18.0)
MCH: 30.7 pg (ref 26.0–34.0)
MCHC: 33.5 g/dL (ref 32.0–36.0)
MCV: 91.8 fL (ref 80.0–100.0)
Platelets: 188 10*3/uL (ref 150–440)
RBC: 4.74 MIL/uL (ref 4.40–5.90)
RDW: 15 % — ABNORMAL HIGH (ref 11.5–14.5)
WBC: 5.4 10*3/uL (ref 3.8–10.6)

## 2016-08-24 LAB — URINALYSIS, COMPLETE (UACMP) WITH MICROSCOPIC
Bacteria, UA: NONE SEEN
Bilirubin Urine: NEGATIVE
Glucose, UA: NEGATIVE mg/dL
Hgb urine dipstick: NEGATIVE
Ketones, ur: NEGATIVE mg/dL
Leukocytes, UA: NEGATIVE
Nitrite: NEGATIVE
Protein, ur: NEGATIVE mg/dL
RBC / HPF: NONE SEEN RBC/hpf (ref 0–5)
Specific Gravity, Urine: 1.014 (ref 1.005–1.030)
Squamous Epithelial / HPF: NONE SEEN
pH: 6 (ref 5.0–8.0)

## 2016-08-24 LAB — BASIC METABOLIC PANEL
Anion gap: 7 (ref 5–15)
BUN: 19 mg/dL (ref 6–20)
CO2: 27 mmol/L (ref 22–32)
Calcium: 8.7 mg/dL — ABNORMAL LOW (ref 8.9–10.3)
Chloride: 106 mmol/L (ref 101–111)
Creatinine, Ser: 1.45 mg/dL — ABNORMAL HIGH (ref 0.61–1.24)
GFR calc Af Amer: 48 mL/min — ABNORMAL LOW (ref >60)
GFR calc non Af Amer: 41 mL/min — ABNORMAL LOW (ref >60)
Glucose, Bld: 108 mg/dL — ABNORMAL HIGH (ref 65–99)
Potassium: 4.7 mmol/L (ref 3.5–5.1)
Sodium: 140 mmol/L (ref 135–145)

## 2016-08-24 LAB — TROPONIN I: Troponin I: <0.03 ng/mL (ref <0.03)

## 2016-08-24 MED ORDER — MECLIZINE HCL 25 MG PO TABS: 25.0000 mg | ORAL_TABLET | Freq: Three times a day (TID) | ORAL | 0 refills | Status: DC | PRN

## 2016-08-24 MED ORDER — LORAZEPAM 2 MG/ML IJ SOLN
0.5000 mg | Freq: Once | INTRAMUSCULAR | Status: AC
  Administered 2016-08-24: 0.5 mg via INTRAVENOUS
  Filled 2016-08-24: qty 1

## 2016-08-24 MED ORDER — SODIUM CHLORIDE 0.9 % IV BOLUS (SEPSIS)
250.0000 mL | Freq: Once | INTRAVENOUS | Status: AC
Start: 2016-08-24 — End: 2016-08-24
  Administered 2016-08-24: 250 mL via INTRAVENOUS

## 2016-08-24 NOTE — ED Provider Notes (Signed)
[facility] Emergency Department Provider Note   ____________________________________________    I have reviewed the triage vital signs and the nursing notes.   HISTORY  Chief Complaint Dizziness     HPI Johnathan [name] is a 83 y.o. male who presents with complaints of dizziness. Patient reports when he stands up he feels imbalanced and thinks he might fall over. He reports a history of vertigo which required admission in the past. He is not sure what made it better. He has not taken anything for this. He denies neuro deficits. No headache. No fevers or chills. No neck pain. Mild nausea no vomiting. No chest pain or palpitations   Past Medical History:  Diagnosis Date  . Abdominal pain    INTERMITTENT  . ARMD (age related macular degeneration)   . Arthritis    hands  . Asthma   . Atherosclerosis of native arteries of the extremities, unspecified   . CAD (coronary artery disease)    3 vessel/ DR FATH CARDIOLOGIST GAVE CLEARANCE  . Colitis    HX OF  . Constipation   . COPD (chronic obstructive pulmonary disease) ([place])   . GERD (gastroesophageal reflux disease)   . Glaucoma    both eyes/ PATIENT CAN NOT SEE  . HOH (hard of hearing)   . Hyperlipidemia   . Hypertension   . Neuromuscular disorder (HCC)    numbness in feet  . Occlusion and stenosis of carotid artery without mention of cerebral infarction   . Prostate cancer ([place])   . Prostate pain   . Shortness of breath dyspnea   . TIA (transient ischemic attack)    hx of/ 10 yrs ago  . Vertigo   . Wears dentures    upper and lower    Patient Active Problem List   Diagnosis Date Noted  . Vertigo 06/01/2016  . Dizziness 05/23/2016  . Renal artery stenosis ([place]) 12/29/2015  . Aneurysm of thoracic aorta ([place]) 12/29/2015  . 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 Hydrocele, bilateral 09/09/2014  . Hydrocele sac 08/22/2014  . History of prostate cancer 08/22/2014  .  ASCVD (arteriosclerotic cardiovascular disease) 08/06/2014  . Cardiomyopathy, ischemic 08/06/2014  . Prostate cancer ([place]) 08/06/2014  . Obesity 08/06/2014  . Insomnia 08/06/2014  . Neuropathy 08/06/2014  . Abdominal aortic aneurysm ([place]) 08/06/2014  . COPD (chronic obstructive pulmonary disease) ([place]) 08/06/2014  . Hyperglycemia 08/06/2014  . Acid reflux 08/05/2014  . Age-related macular degeneration 08/05/2014  . Branch retinal vein occlusion 08/05/2014  . 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 Hypercholesteremia 08/05/2014  . Pseudoaphakia 08/05/2014  . Secondary glaucoma 08/05/2014  . Osteoarthritis 08/05/2014  . Carotid stenosis 04/02/2014  . PAD (peripheral artery disease) ([place]) 04/02/2014  . 3-vessel CAD 11/06/2013  . Colonic stricture ([place]) 02/21/2013  . BP (high blood pressure) 11/22/2012  . Kidney failure 11/21/2012  . BK (bullous keratopathy) 06/28/2012    Past Surgical History:  Procedure Laterality Date  . aortoiliac bypass   1986  . CAROTID ENDARTERECTOMY Right 2003  . CATARACT EXTRACTION    . COLONOSCOPY  02-06-13   Dr Candace Cruise  . COLONOSCOPY WITH PROPOFOL N/A 07/09/2015   Procedure: COLONOSCOPY WITH PROPOFOL;  Surgeon: Hulen Luster, MD;  Location: [facility];  Service: Gastroenterology;  Laterality: N/A;  . CORONARY ARTERY BYPASS GRAFT  2000   x3  . HERNIA REPAIR  1992  . PROSTATE SURGERY  2001    Prior to Admission medications   Medication Sig  Start Date End Date Taking? Authorizing Provider  amLODipine (NORVASC) 5 MG tablet TAKE 1 TABLET DAILY 02/26/16  Yes Jerrol Banana., MD  aspirin 325 MG EC tablet Take 325 mg by mouth daily.   Yes [provider]  brimonidine (ALPHAGAN) 0.2 % ophthalmic solution Place 1 drop into the right eye 2 (two) times daily.    Yes [provider]  esomeprazole (NEXIUM) 20 MG capsule TAKE 1 CAPSULE TWICE A DAY 06/23/16  Yes Jerrol Banana., MD  latanoprost (XALATAN) 0.005 % ophthalmic solution Place  1 drop into both eyes at bedtime.  01/25/13  Yes [provider]  prednisoLONE acetate (PRED FORTE) 1 % ophthalmic suspension Place 1 drop into the left eye daily.    Yes [provider]  simvastatin (ZOCOR) 20 MG tablet TAKE 1 TABLET DAILY 07/26/16  Yes Jerrol Banana., MD  diazepam (VALIUM) 2 MG tablet Take 1 tablet (2 mg total) by mouth every 8 (eight) hours as needed (dizziness). Patient not taking: Reported on 07/07/2016 05/24/16   Gladstone Lighter, MD  doxycycline (VIBRA-TABS) 100 MG tablet Take 1 tablet (100 mg total) by mouth 2 (two) times daily. Patient not taking: Reported on 07/07/2016 06/01/16   Jerrol Banana., MD  losartan (COZAAR) 25 MG tablet Take 1 tablet (25 mg total) by mouth daily. Patient not taking: Reported on 08/24/2016 07/07/16   Jerrol Banana., MD  meclizine (ANTIVERT) 25 MG tablet Take 1 tablet (25 mg total) by mouth 3 (three) times daily as needed for dizziness. 08/24/16   Lavonia Drafts, MD  Polyethylene Glycol 3350 (MIRALAX PO) Take by mouth as needed.    [provider]  traZODone (DESYREL) 50 MG tablet Take 0.5-1 tablets (25-50 mg total) by mouth at bedtime as needed for sleep. Patient not taking: Reported on 08/24/2016 06/01/16   Jerrol Banana., MD     Allergies Sulfa antibiotics and Penicillins  Family History  Problem Relation Age of Onset  . Cirrhosis Mother        liver  . Heart attack Father   . Prostate cancer Paternal Uncle   . Kidney disease Neg Hx     Social History Social History  Substance Use Topics  . Smoking status: Former Smoker    Packs/day: 1.00    Years: 30.00    Types: Cigarettes    Quit date: 02/16/2003  . Smokeless tobacco: Never Used     Comment: is exposed to second hand smoke  . Alcohol use No    Review of Systems  Constitutional: No fever/chills Eyes: No visual changes.  ENT: No sore throat. Cardiovascular: Denies chest pain. Respiratory: Denies shortness of  breath. Gastrointestinal: No abdominal pain.   Genitourinary: Negative for dysuria. Musculoskeletal: Negative for back pain. Skin: Negative for rash. Neurological: Negative for headaches    ____________________________________________   PHYSICAL EXAM:  VITAL SIGNS: ED Triage Vitals [08/24/16 1142]  Enc Vitals Group     BP 129/70     Pulse Rate 62     Resp 16     Temp      Temp Source Oral     SpO2 96 %     Weight 100.2 kg (221 lb)     Height 1.727 m (5\' 8" )     Head Circumference      Peak Flow      Pain Score      Pain Loc      Pain Edu?  Excl. in [place]?     Constitutional: Alert and oriented. No acute distress. Pleasant and interactive Eyes: Conjunctivae are normal. PERRLA, EOMI  Head: Atraumatic. Nose: No congestion/rhinnorhea. Mouth/Throat: Mucous membranes are moist.    Cardiovascular: Normal rate, regular rhythm. Grossly normal heart sounds.  Good peripheral circulation. Respiratory: Normal respiratory effort.  No retractions. Lungs CTAB. Gastrointestinal: Soft and nontender. No distention.  No CVA tenderness. Genitourinary: deferred Musculoskeletal:   Warm and well perfused Neurologic:  Normal speech and language. No gross focal neurologic deficits are appreciated. Normal strength all extremities Skin:  Skin is warm, dry and intact. No rash noted. Psychiatric: Mood and affect are normal. Speech and behavior are normal.  ____________________________________________   LABS (all labs ordered are listed, but only abnormal results are displayed)  Labs Reviewed  BASIC METABOLIC PANEL - Abnormal; Notable for the following:       Result Value   Glucose, Bld 108 (*)    Creatinine, Ser 1.45 (*)    Calcium 8.7 (*)    GFR calc non Af Amer 41 (*)    GFR calc Af Amer 48 (*)    All other components within normal limits  CBC - Abnormal; Notable for the following:    RDW 15.0 (*)    All other components within normal limits  URINALYSIS, COMPLETE (UACMP) WITH  MICROSCOPIC - Abnormal; Notable for the following:    Color, Urine YELLOW (*)    APPearance CLEAR (*)    All other components within normal limits  TROPONIN I   ____________________________________________  EKG  ED ECG REPORT I, Lavonia Drafts, the attending physician, personally viewed and interpreted this ECG.  Date: 08/24/2016  Rate:64 Rhythm: Atrial flutter QRS Axis: normal Intervals: normal ST/T Wave abnormalities: normal   ____________________________________________  RADIOLOGY  None ____________________________________________   PROCEDURES  Procedure(s) performed: No    Critical Care performed: No ____________________________________________   INITIAL IMPRESSION / ASSESSMENT AND PLAN / ED COURSE  Pertinent labs & imaging results that were available during my care of the patient were reviewed by me and considered in my medical decision making (see chart for details).  Patient presents with vertiginous symptoms. These are similar to prior presentation. At that time he had an MRI/MRA which was unremarkable. Patient denies headache. No neuro deficits, change in vision or other abnormalities  After treatment patient felt significant better. He is able to ambulate easily without any assistance. He would like to go home. I feel this is reason that he feels better. He knows to return if any change in his symptoms.    ____________________________________________   FINAL CLINICAL IMPRESSION(S) / ED DIAGNOSES  Final diagnoses:  Vertigo      NEW MEDICATIONS STARTED DURING THIS VISIT:  New Prescriptions   MECLIZINE (ANTIVERT) 25 MG TABLET    Take 1 tablet (25 mg total) by mouth 3 (three) times daily as needed for dizziness.     Note:  This document was prepared using Dragon voice recognition software and may include unintentional dictation errors.    Lavonia Drafts, MD 08/24/16 1504

## 2016-08-24 NOTE — ED Triage Notes (Addendum)
Pt c/o dizziness with loss of balance since last night. Denies any pain or vision changes. Denies any N/V.[name] States he was admitted with vertigo in the past.

## 2016-09-07 NOTE — Progress Notes (Signed)
09/08/2016 8:36 AM   Johnathan [name] 06-06-1925 [id number]  Referring provider: Jerrol Banana., MD [address] [place], [place] [address]  Chief Complaint  Patient presents with  . Prostate Cancer    1 year follow up  . Hydrocele    HPI: Patient is an 83 year old Caucasian male with a history of prostate cancer and bilateral hydroceles who presents today for a one year follow up.  History of prostate cancer Patient underwent a radical prostatectomy in 2000 at [facility].  He does not remember the name of the physician. His most recent PSA was <0.1 ng/mL on 08/22/2014.  His current I PSS score is 6/2.  His previous IPSS 6/1.      IPSS    Row Name 09/08/16 0800         International Prostate Symptom Score   How often have you had the sensation of not emptying your bladder? Less than 1 in 5     How often have you had to urinate less than every two hours? Less than 1 in 5 times     How often have you found you stopped and started again several times when you urinated? Less than 1 in 5 times     How often have you found it difficult to postpone urination? Not at All     How often have you had a weak urinary stream? Less than 1 in 5 times     How often have you had to strain to start urination? Not at All     How many times did you typically get up at night to urinate? 2 Times     Total IPSS Score 6       Quality of Life due to urinary symptoms   If you were to spend the rest of your life with your urinary condition just the way it is now how would you feel about that? Mostly Satisfied        Score:  1-7 Mild 8-19 Moderate 20-35 Severe  Bilateral hydroceles Patient presented to [place] at the request of his primary care physician, Dr. Rosanna Randy for hydroceles. Patient stated the swelling has been going on for years. He is not having pain in his testicles, they are just getting bigger.  He denies any trauma to the scrotal area or infections.   Patient's ultrasound performed on 08/28/2014 demonstrated bilateral moderate sized hydroceles, right epididymal cysts and a left epididymal cyst versus a septated complex hydrocele.  He does have a little pain on the right side first thing in the morning.  The pain than dissipates by the evening.     PMH: Past Medical History:  Diagnosis Date  . Abdominal pain    INTERMITTENT  . ARMD (age related macular degeneration)   . Arthritis    hands  . Asthma   . Atherosclerosis of native arteries of the extremities, unspecified   . CAD (coronary artery disease)    3 vessel/ DR FATH CARDIOLOGIST GAVE CLEARANCE  . Colitis    HX OF  . Constipation   . COPD (chronic obstructive pulmonary disease) ([place])   . GERD (gastroesophageal reflux disease)   . Glaucoma    both eyes/ PATIENT CAN NOT SEE  . HOH (hard of hearing)   . Hyperlipidemia   . Hypertension   . Neuromuscular disorder (HCC)    numbness in feet  . Occlusion and stenosis of carotid artery without mention of cerebral infarction   .  Prostate cancer ([place])   . Prostate pain   . Shortness of breath dyspnea   . TIA (transient ischemic attack)    hx of/ 10 yrs ago  . Vertigo   . Wears dentures    upper and lower    Surgical History: Past Surgical History:  Procedure Laterality Date  . aortoiliac bypass   1986  . CAROTID ENDARTERECTOMY Right 2003  . CATARACT EXTRACTION    . COLONOSCOPY  02-06-13   Dr Candace Cruise  . COLONOSCOPY WITH PROPOFOL N/A 07/09/2015   Procedure: COLONOSCOPY WITH PROPOFOL;  Surgeon: Hulen Luster, MD;  Location: [facility];  Service: Gastroenterology;  Laterality: N/A;  . CORONARY ARTERY BYPASS GRAFT  2000   x3  . HERNIA REPAIR  1992  . PROSTATE SURGERY  2001    Home Medications:  Allergies as of 09/08/2016      Reactions   Sulfa Antibiotics Other (See Comments)   Other reaction(s): Other (See Comments)   Penicillins Rash   Has patient had a PCN reaction causing immediate rash, facial/tongue/throat  swelling, SOB or lightheadedness with hypotension: No Has patient had a PCN reaction causing severe rash involving mucus membranes or skin necrosis: No Has patient had a PCN reaction that required hospitalization: No Has patient had a PCN reaction occurring within the last 10 years: No If all of the above answers are "NO", then may proceed with Cephalosporin use.      Medication List       Accurate as of 09/08/16  8:36 AM. Always use your most recent med list.          amLODipine 5 MG tablet Commonly known as:  NORVASC TAKE 1 TABLET DAILY   aspirin 325 MG EC tablet Take 325 mg by mouth daily.   brimonidine 0.2 % ophthalmic solution Commonly known as:  ALPHAGAN Place 1 drop into the right eye 2 (two) times daily.   diazepam 2 MG tablet Commonly known as:  VALIUM Take 1 tablet (2 mg total) by mouth every 8 (eight) hours as needed (dizziness).   doxycycline 100 MG tablet Commonly known as:  VIBRA-TABS Take 1 tablet (100 mg total) by mouth 2 (two) times daily.   esomeprazole 20 MG capsule Commonly known as:  NEXIUM TAKE 1 CAPSULE TWICE A DAY   latanoprost 0.005 % ophthalmic solution Commonly known as:  XALATAN Place 1 drop into both eyes at bedtime.   losartan 25 MG tablet Commonly known as:  COZAAR Take 1 tablet (25 mg total) by mouth daily.   meclizine 25 MG tablet Commonly known as:  ANTIVERT Take 1 tablet (25 mg total) by mouth 3 (three) times daily as needed for dizziness.   MIRALAX PO Take by mouth as needed.   prednisoLONE acetate 1 % ophthalmic suspension Commonly known as:  PRED FORTE Place 1 drop into the left eye daily.   simvastatin 20 MG tablet Commonly known as:  ZOCOR TAKE 1 TABLET DAILY   traZODone 50 MG tablet Commonly known as:  DESYREL Take 0.5-1 tablets (25-50 mg total) by mouth at bedtime as needed for sleep.       Allergies:  Allergies  Allergen Reactions  . Sulfa Antibiotics Other (See Comments)    Other reaction(s): Other (See  Comments)  . Penicillins Rash    Has patient had a PCN reaction causing immediate rash, facial/tongue/throat swelling, SOB or lightheadedness with hypotension: No Has patient had a PCN reaction causing severe rash involving mucus membranes or skin necrosis:  No Has patient had a PCN reaction that required hospitalization: No Has patient had a PCN reaction occurring within the last 10 years: No If all of the above answers are "NO", then may proceed with Cephalosporin use.     Family History: Family History  Problem Relation Age of Onset  . Cirrhosis Mother        liver  . Heart attack Father   . Prostate cancer Paternal Uncle   . Kidney disease Neg Hx   . Kidney cancer Neg Hx   . Bladder Cancer Neg Hx     Social History:  reports that he quit smoking about 13 years ago. His smoking use included Cigarettes. He has a 30.00 pack-year smoking history. He has never used smokeless tobacco. He reports that he does not drink alcohol or use drugs.  ROS: UROLOGY Frequent Urination?: No Hard to postpone urination?: No Burning/pain with urination?: No Get up at night to urinate?: No Leakage of urine?: No Urine stream starts and stops?: No Trouble starting stream?: No Do you have to strain to urinate?: No Blood in urine?: No Urinary tract infection?: No Sexually transmitted disease?: No Injury to kidneys or bladder?: No Painful intercourse?: No Weak stream?: No Erection problems?: No Penile pain?: No  Gastrointestinal Nausea?: No Vomiting?: No Indigestion/heartburn?: No Diarrhea?: No Constipation?: No  Constitutional Fever: No Night sweats?: No Weight loss?: No Fatigue?: Yes  Skin Skin rash/lesions?: No Itching?: No  Eyes Blurred vision?: Yes Double vision?: No  Ears/Nose/Throat Sore throat?: No Sinus problems?: No  Hematologic/Lymphatic Swollen glands?: No Easy bruising?: No  Cardiovascular Leg swelling?: No Chest pain?: No  Respiratory Cough?:  Yes Shortness of breath?: Yes  Endocrine Excessive thirst?: No  Musculoskeletal Back pain?: No Joint pain?: Yes  Neurological Headaches?: No Dizziness?: No  Psychologic Depression?: No Anxiety?: No  Physical Exam: BP (!) 171/76   Pulse 62   Ht 5\' 8"  (1.727 m)   Wt 222 lb 9.6 oz (101 kg)   BMI 33.85 kg/m   Constitutional: Well nourished. Alert and oriented, No acute distress. HEENT: [place] AT, moist mucus membranes. Trachea midline, no masses. Cardiovascular: No clubbing, cyanosis, or edema. Respiratory: Normal respiratory effort, no increased work of breathing. GI: Abdomen is soft, non tender, non distended, no abdominal masses. Liver and spleen not palpable.  No hernias appreciated.  Stool sample for occult testing is not indicated.   GU: No CVA tenderness.  No bladder fullness or masses.  Patient with uncircumcised phallus. Foreskin easily retracted Urethral meatus is patent.  No penile discharge. No penile lesions or rashes. Scrotum without lesions, cysts, rashes and/or edema.  Testicles are located scrotally bilaterally. No masses are appreciated in the testicles. Left and right epididymis are normal.  Bilateral hydroceles.  Rectal: Deferred.  Skin: No rashes, bruises or suspicious lesions. Lymph: No cervical or inguinal adenopathy. Neurologic: Grossly intact, no focal deficits, moving all 4 extremities. Psychiatric: Normal mood and affect.  Laboratory Data: Lab Results  Component Value Date   WBC 5.4 08/24/2016   HGB 14.6 08/24/2016   HCT 43.5 08/24/2016   MCV 91.8 08/24/2016   PLT 188 08/24/2016    Lab Results  Component Value Date   CREATININE 1.45 (H) 08/24/2016      Lab Results  Component Value Date   HGBA1C 5.5 05/24/2016    I have reviewed the labs  Pertinent Imaging: CLINICAL DATA: Scrotal swelling.  EXAM: SCROTAL ULTRASOUND  DOPPLER ULTRASOUND OF THE TESTICLES  TECHNIQUE: Complete ultrasound examination of the  testicles, epididymis,  and other scrotal structures was performed. Color and spectral Doppler ultrasound were also utilized to evaluate blood flow to the testicles.  COMPARISON: None.  FINDINGS: Right testicle  Measurements: 4.8 x 3.2 x 3.1 cm. A a tiny 3 mm and 4 mm simple cyst noted in the right testicle. No significant mass.  Left testicle  Measurements: 4.4 x 3.1 x 2.9 cm. No mass or microlithiasis visualized.  Right epididymis: Multiple right epididymal cyst, the largest measures 2 cm.  Left epididymis: Large 7.1 x 4.9 x 6.1 cm epididymal cyst versus septated complex hydrocele.  Hydrocele: Bilateral moderate hydroceles noted.  Varicocele: None visualized.  Pulsed Doppler interrogation of both testes demonstrates normal low resistance arterial and venous waveforms bilaterally.  IMPRESSION: 1. Large 7.1 x 4.9 x 6.1 cm left epididymal cyst versus septated complex hydrocele. 2. Right epididymal cysts. Largest measures 2 cm. 3. Bilateral moderate size hydroceles. 4. No evidence of testicular torsion or testicular mass.   Electronically Signed  By: Marcello Moores Register  On: 08/28/2014 15:44   Assessment & Plan:   1. Bilateral hydroceles: Patient was found to have bilateral hydroceles on scrotal ultrasound. Discussed conservative management, drainage of the hydrocele in office and surgical correction of the hydroceles.  He has decided to continue with conservative management. He will follow-up in one year or sooner if the hydroceles become larger or cause him discomfort.  2. History of PCa: Patient underwent a radical prostatectomy in 2000.  IPSS score is 6/2.   His most recent PSA was <0.1 ng/mL on 08/22/2014.     Return in about 1 year (around 09/08/2017) for I PSS and exam.  Zara Council, [facility] [address], [place], [place] [address] [phone]

## 2016-09-08 ENCOUNTER — Encounter: Payer: Self-pay | Admitting: Urology

## 2016-09-08 ENCOUNTER — Ambulatory Visit (INDEPENDENT_AMBULATORY_CARE_PROVIDER_SITE_OTHER): Payer: Medicare Other | Admitting: Urology

## 2016-09-08 VITALS — BP 171/76 | HR 62 | Ht 68.0 in | Wt 222.6 lb

## 2016-09-08 DIAGNOSIS — Z8546 Personal history of malignant neoplasm of prostate: Secondary | ICD-10-CM | POA: Diagnosis not present

## 2016-09-08 DIAGNOSIS — N433 Hydrocele, unspecified: Secondary | ICD-10-CM

## 2016-10-04 ENCOUNTER — Other Ambulatory Visit: Payer: Self-pay | Admitting: Family Medicine

## 2016-10-04 NOTE — Telephone Encounter (Signed)
Pt contacted office for refill request on the following medications:  amLODipine (NORVASC) 5 MG tablet.  90 day supply.   [facility] mail order.  CB#[phone]/MW

## 2016-10-05 MED ORDER — AMLODIPINE BESYLATE 5 MG PO TABS: 5.0000 mg | ORAL_TABLET | Freq: Every day | ORAL | 3 refills | Status: DC

## 2016-10-06 ENCOUNTER — Telehealth: Payer: Self-pay | Admitting: Family Medicine

## 2016-10-06 DIAGNOSIS — M25551 Pain in right hip: Secondary | ICD-10-CM

## 2016-10-06 NOTE — Telephone Encounter (Signed)
Ortho referral  

## 2016-10-06 NOTE — Telephone Encounter (Signed)
Right hip is giving pt problem per daughter.  Pt is wanting a Cortizone shot.  Not sure if he needs to do it in the office or if he needs to be referred.    Please contact the patient daughter Donivan Scull) regarding appt - daughter on [place]

## 2016-10-06 NOTE — Telephone Encounter (Signed)
Spoke with daughter, will go ahead and refer patient to ortho. Patient saw Dr Derrel Nip years ago but not sure where he is if practicing anymore. Patient saw Dr Prescott Parma in 2017 and he was not able to help him so he would like to see another orthopedic doctor. Order put in and daughter advised that their office will get in touch with patient-aa

## 2016-10-06 NOTE — Telephone Encounter (Signed)
Please review-aa 

## 2016-11-09 ENCOUNTER — Ambulatory Visit (INDEPENDENT_AMBULATORY_CARE_PROVIDER_SITE_OTHER): Payer: Medicare Other | Admitting: Family Medicine

## 2016-11-09 VITALS — BP 122/64 | HR 64 | Temp 97.5°F | Resp 16 | Wt 227.0 lb

## 2016-11-09 DIAGNOSIS — I2510 Atherosclerotic heart disease of native coronary artery without angina pectoris: Secondary | ICD-10-CM

## 2016-11-09 DIAGNOSIS — I10 Essential (primary) hypertension: Secondary | ICD-10-CM

## 2016-11-09 DIAGNOSIS — I701 Atherosclerosis of renal artery: Secondary | ICD-10-CM | POA: Diagnosis not present

## 2016-11-09 DIAGNOSIS — Z23 Encounter for immunization: Secondary | ICD-10-CM | POA: Diagnosis not present

## 2016-11-09 DIAGNOSIS — G5790 Unspecified mononeuropathy of unspecified lower limb: Secondary | ICD-10-CM

## 2016-11-09 LAB — B12 AND FOLATE PANEL
Folate: 12.6 ng/mL
Vitamin B-12: 310 pg/mL (ref 200–1100)

## 2016-11-09 NOTE — Progress Notes (Signed)
Johnathan [name]  MRN: [id number] DOB: [birth year]  Subjective:  HPI   The patient is an 83 year old male who presents today for follow up of his hypertension.  He was last seen in our office on 07/07/16.  His blood pressure at that time was 164/60.  He was seen in the ED on 08/24/16 for vertigo and his blood pressure was 129/70.  He does not check his blood pressure outside of medical facilities.   The patient denies any complaints and states he is compliant with his medications.  The patient did have Losartan on his list of medications but it was noted that he was not taking it on his last visit.  He was instructed to go back on the Losartan and follow up today, however he is still not taking the Losartan.  Patient Active Problem List   Diagnosis Date Noted  . Vertigo 06/01/2016  . Dizziness 05/23/2016  . Renal artery stenosis ([place]) 12/29/2015  . Aneurysm of thoracic aorta ([place]) 12/29/2015  . 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 Hydrocele, bilateral 09/09/2014  . Hydrocele sac 08/22/2014  . History of prostate cancer 08/22/2014  . ASCVD (arteriosclerotic cardiovascular disease) 08/06/2014  . Cardiomyopathy, ischemic 08/06/2014  . Prostate cancer ([place]) 08/06/2014  . Obesity 08/06/2014  . Insomnia 08/06/2014  . Neuropathy 08/06/2014  . Abdominal aortic aneurysm ([place]) 08/06/2014  . COPD (chronic obstructive pulmonary disease) ([place]) 08/06/2014  . Hyperglycemia 08/06/2014  . Acid reflux 08/05/2014  . Age-related macular degeneration 08/05/2014  . Branch retinal vein occlusion 08/05/2014  . 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 Hypercholesteremia 08/05/2014  . Pseudoaphakia 08/05/2014  . Secondary glaucoma 08/05/2014  . Osteoarthritis 08/05/2014  . Carotid stenosis 04/02/2014  . PAD (peripheral artery disease) ([place]) 04/02/2014  . 3-vessel CAD 11/06/2013  . Colonic stricture ([place]) 02/21/2013  . BP (high blood pressure) 11/22/2012  . Kidney failure 11/21/2012  . BK  (bullous keratopathy) 06/28/2012    Past Medical History:  Diagnosis Date  . Abdominal pain    INTERMITTENT  . ARMD (age related macular degeneration)   . Arthritis    hands  . Asthma   . Atherosclerosis of native arteries of the extremities, unspecified   . CAD (coronary artery disease)    3 vessel/ DR FATH CARDIOLOGIST GAVE CLEARANCE  . Colitis    HX OF  . Constipation   . COPD (chronic obstructive pulmonary disease) ([place])   . GERD (gastroesophageal reflux disease)   . Glaucoma    both eyes/ PATIENT CAN NOT SEE  . HOH (hard of hearing)   . Hyperlipidemia   . Hypertension   . Neuromuscular disorder (HCC)    numbness in feet  . Occlusion and stenosis of carotid artery without mention of cerebral infarction   . Prostate cancer ([place])   . Prostate pain   . Shortness of breath dyspnea   . TIA (transient ischemic attack)    hx of/ 10 yrs ago  . Vertigo   . Wears dentures    upper and lower    Social History   Social History  . Marital status: Widowed    Spouse name: N/A  . Number of children: 4  . Years of education: N/A   Occupational History  . retired    Social History Main Topics  . Smoking status: Former Smoker    Packs/day: 1.00    Years: 30.00    Types: Cigarettes    Quit date: 02/16/2003  . Smokeless tobacco: Never  Used     Comment: is exposed to second hand smoke  . Alcohol use No  . Drug use: No  . Sexual activity: Not Currently   Other Topics Concern  . Not on file   Social History Narrative  . No narrative on file    Outpatient Encounter Prescriptions as of 11/09/2016  Medication Sig Note  . amLODipine (NORVASC) 5 MG tablet Take 1 tablet (5 mg total) by mouth daily.   [name] aspirin 325 MG EC tablet Take 325 mg by mouth daily.   . brimonidine (ALPHAGAN) 0.2 % ophthalmic solution Place 1 drop into the right eye 2 (two) times daily.    [name] esomeprazole (NEXIUM) 20 MG capsule TAKE 1 CAPSULE TWICE A DAY   . latanoprost (XALATAN) 0.005 % ophthalmic  solution Place 1 drop into both eyes at bedtime.    . Polyethylene Glycol 3350 (MIRALAX PO) Take by mouth as needed.   . prednisoLONE acetate (PRED FORTE) 1 % ophthalmic suspension Place 1 drop into the left eye daily.    . simvastatin (ZOCOR) 20 MG tablet TAKE 1 TABLET DAILY   . meclizine (ANTIVERT) 25 MG tablet Take 1 tablet (25 mg total) by mouth 3 (three) times daily as needed for dizziness. (Patient not taking: Reported on 11/09/2016) 09/08/2016: PRN  . traZODone (DESYREL) 50 MG tablet Take 0.5-1 tablets (25-50 mg total) by mouth at bedtime as needed for sleep. (Patient not taking: Reported on 08/24/2016)   . [DISCONTINUED] diazepam (VALIUM) 2 MG tablet Take 1 tablet (2 mg total) by mouth every 8 (eight) hours as needed (dizziness). (Patient not taking: Reported on 07/07/2016)   . [DISCONTINUED] doxycycline (VIBRA-TABS) 100 MG tablet Take 1 tablet (100 mg total) by mouth 2 (two) times daily. (Patient not taking: Reported on 07/07/2016)   . [DISCONTINUED] losartan (COZAAR) 25 MG tablet Take 1 tablet (25 mg total) by mouth daily. (Patient not taking: Reported on 08/24/2016)    No facility-administered encounter medications on file as of 11/09/2016.     Allergies  Allergen Reactions  . Sulfa Antibiotics Other (See Comments)    Other reaction(s): Other (See Comments)  . Penicillins Rash    Has patient had a PCN reaction causing immediate rash, facial/tongue/throat swelling, SOB or lightheadedness with hypotension: No Has patient had a PCN reaction causing severe rash involving mucus membranes or skin necrosis: No Has patient had a PCN reaction that required hospitalization: No Has patient had a PCN reaction occurring within the last 10 years: No If all of the above answers are "NO", then may proceed with Cephalosporin use.     Review of Systems  Constitutional: Positive for malaise/fatigue. Negative for fever.  Respiratory: Positive for shortness of breath. Negative for cough and wheezing.     Cardiovascular: Negative for chest pain, palpitations and orthopnea.  Gastrointestinal: Negative.   Musculoskeletal: Positive for joint pain.  Neurological: Positive for weakness.  Endo/Heme/Allergies: Negative.   Psychiatric/Behavioral: Negative.     Objective:  BP 122/64 (BP Location: Right Arm, Patient Position: Sitting, Cuff Size: Normal)   Pulse 64   Temp (!) 97.5 F (36.4 C) (Oral)   Resp 16   Wt 227 lb (103 kg)   BMI 34.52 kg/m   Physical Exam  Constitutional: He is oriented to person, place, and time and well-developed, well-nourished, and in no distress.  HENT:  Head: Normocephalic and atraumatic.  Right Ear: External ear normal.  Left Ear: External ear normal.  Nose: Nose normal.  Eyes: Pupils are equal,  round, and reactive to light. Conjunctivae are normal.  Neck: Normal range of motion.  Cardiovascular: Normal rate, regular rhythm and normal heart sounds.   Pulmonary/Chest: Effort normal and breath sounds normal.  Abdominal: Soft.  Musculoskeletal: Edema: trace.  Neurological: He is alert and oriented to person, place, and time.  Skin: Skin is warm and dry.  Psychiatric: Mood, memory, affect and judgment normal.    Assessment and Plan :   1. Essential hypertension Patient did not go on the Losartan, will leave it off at this time since his BP is doing ok today.  2. Need for influenza vaccination  - Flu vaccine HIGH DOSE PF (Fluzone High dose) 3.CAD/ASCVD 4.HLD  I have done the exam and reviewed the chart and it is accurate to the best of my knowledge. Development worker, community has been used and  any errors in dictation or transcription are unintentional. Miguel Aschoff M.D. [facility]

## 2016-11-10 LAB — HEMOGLOBIN A1C
Hgb A1c MFr Bld: 5.5 % of total Hgb (ref <5.7)
Mean Plasma Glucose: 111 (calc)
eAG (mmol/L): 6.2 (calc)

## 2016-11-10 LAB — TSH: TSH: 1.61 mIU/L (ref 0.40–4.50)

## 2016-11-12 ENCOUNTER — Telehealth: Payer: Self-pay

## 2016-11-12 NOTE — Telephone Encounter (Signed)
-----   Message from Jerrol Banana., MD sent at 11/11/2016  3:57 PM EDT ----- Labs normal.

## 2016-11-12 NOTE — Telephone Encounter (Signed)
Patient advised.KW 

## 2016-12-27 ENCOUNTER — Other Ambulatory Visit: Payer: Self-pay | Admitting: Orthopedic Surgery

## 2016-12-27 DIAGNOSIS — M25551 Pain in right hip: Secondary | ICD-10-CM

## 2017-01-03 ENCOUNTER — Ambulatory Visit
Admission: RE | Admit: 2017-01-03 | Discharge: 2017-01-03 | Disposition: A | Discharge status: Home / Self Care | Payer: Medicare Other | Source: Ambulatory Visit | Attending: Orthopedic Surgery | Admitting: Orthopedic Surgery

## 2017-01-03 DIAGNOSIS — I77811 Abdominal aortic ectasia: Secondary | ICD-10-CM | POA: Insufficient documentation

## 2017-01-03 DIAGNOSIS — I700 Atherosclerosis of aorta: Secondary | ICD-10-CM | POA: Diagnosis not present

## 2017-01-03 DIAGNOSIS — M5136 Other intervertebral disc degeneration, lumbar region: Secondary | ICD-10-CM | POA: Insufficient documentation

## 2017-01-03 DIAGNOSIS — M25559 Pain in unspecified hip: Secondary | ICD-10-CM | POA: Insufficient documentation

## 2017-01-03 DIAGNOSIS — M48061 Spinal stenosis, lumbar region without neurogenic claudication: Secondary | ICD-10-CM | POA: Diagnosis not present

## 2017-01-03 DIAGNOSIS — M4316 Spondylolisthesis, lumbar region: Secondary | ICD-10-CM | POA: Diagnosis not present

## 2017-01-03 DIAGNOSIS — M25551 Pain in right hip: Secondary | ICD-10-CM

## 2017-03-11 ENCOUNTER — Inpatient Hospital Stay
Admission: EM | Admit: 2017-03-11 | Discharge: 2017-03-13 | DRG: 190 | Disposition: A | Discharge status: Home / Self Care | Payer: Medicare Other | Attending: Internal Medicine | Admitting: Internal Medicine

## 2017-03-11 ENCOUNTER — Emergency Department: Payer: Medicare Other

## 2017-03-11 ENCOUNTER — Telehealth: Payer: Self-pay

## 2017-03-11 ENCOUNTER — Encounter: Payer: Self-pay | Admitting: Emergency Medicine

## 2017-03-11 ENCOUNTER — Other Ambulatory Visit: Payer: Self-pay

## 2017-03-11 DIAGNOSIS — I10 Essential (primary) hypertension: Secondary | ICD-10-CM | POA: Diagnosis present

## 2017-03-11 DIAGNOSIS — J069 Acute upper respiratory infection, unspecified: Secondary | ICD-10-CM

## 2017-03-11 DIAGNOSIS — K219 Gastro-esophageal reflux disease without esophagitis: Secondary | ICD-10-CM | POA: Diagnosis present

## 2017-03-11 DIAGNOSIS — E785 Hyperlipidemia, unspecified: Secondary | ICD-10-CM | POA: Diagnosis present

## 2017-03-11 DIAGNOSIS — J209 Acute bronchitis, unspecified: Secondary | ICD-10-CM | POA: Diagnosis present

## 2017-03-11 DIAGNOSIS — Z8249 Family history of ischemic heart disease and other diseases of the circulatory system: Secondary | ICD-10-CM | POA: Diagnosis not present

## 2017-03-11 DIAGNOSIS — Z882 Allergy status to sulfonamides status: Secondary | ICD-10-CM | POA: Diagnosis not present

## 2017-03-11 DIAGNOSIS — Z8546 Personal history of malignant neoplasm of prostate: Secondary | ICD-10-CM

## 2017-03-11 DIAGNOSIS — Z66 Do not resuscitate: Secondary | ICD-10-CM | POA: Diagnosis present

## 2017-03-11 DIAGNOSIS — Z880 Allergy status to penicillin: Secondary | ICD-10-CM

## 2017-03-11 DIAGNOSIS — H409 Unspecified glaucoma: Secondary | ICD-10-CM | POA: Diagnosis present

## 2017-03-11 DIAGNOSIS — Z8673 Personal history of transient ischemic attack (TIA), and cerebral infarction without residual deficits: Secondary | ICD-10-CM | POA: Diagnosis not present

## 2017-03-11 DIAGNOSIS — H3530 Unspecified macular degeneration: Secondary | ICD-10-CM | POA: Diagnosis present

## 2017-03-11 DIAGNOSIS — Z8042 Family history of malignant neoplasm of prostate: Secondary | ICD-10-CM

## 2017-03-11 DIAGNOSIS — I2510 Atherosclerotic heart disease of native coronary artery without angina pectoris: Secondary | ICD-10-CM | POA: Diagnosis present

## 2017-03-11 DIAGNOSIS — Z7722 Contact with and (suspected) exposure to environmental tobacco smoke (acute) (chronic): Secondary | ICD-10-CM | POA: Diagnosis present

## 2017-03-11 DIAGNOSIS — J9601 Acute respiratory failure with hypoxia: Secondary | ICD-10-CM | POA: Diagnosis present

## 2017-03-11 DIAGNOSIS — Z951 Presence of aortocoronary bypass graft: Secondary | ICD-10-CM

## 2017-03-11 DIAGNOSIS — J441 Chronic obstructive pulmonary disease with (acute) exacerbation: Secondary | ICD-10-CM | POA: Diagnosis present

## 2017-03-11 DIAGNOSIS — J45901 Unspecified asthma with (acute) exacerbation: Secondary | ICD-10-CM | POA: Diagnosis present

## 2017-03-11 DIAGNOSIS — Z87891 Personal history of nicotine dependence: Secondary | ICD-10-CM

## 2017-03-11 DIAGNOSIS — J440 Chronic obstructive pulmonary disease with acute lower respiratory infection: Secondary | ICD-10-CM | POA: Diagnosis present

## 2017-03-11 DIAGNOSIS — Z7982 Long term (current) use of aspirin: Secondary | ICD-10-CM

## 2017-03-11 DIAGNOSIS — H9190 Unspecified hearing loss, unspecified ear: Secondary | ICD-10-CM | POA: Diagnosis present

## 2017-03-11 LAB — BASIC METABOLIC PANEL
Anion gap: 9 (ref 5–15)
BUN: 18 mg/dL (ref 6–20)
CO2: 26 mmol/L (ref 22–32)
Calcium: 8.6 mg/dL — ABNORMAL LOW (ref 8.9–10.3)
Chloride: 102 mmol/L (ref 101–111)
Creatinine, Ser: 1.29 mg/dL — ABNORMAL HIGH (ref 0.61–1.24)
GFR calc Af Amer: 55 mL/min — ABNORMAL LOW (ref >60)
GFR calc non Af Amer: 47 mL/min — ABNORMAL LOW (ref >60)
Glucose, Bld: 108 mg/dL — ABNORMAL HIGH (ref 65–99)
Potassium: 4.9 mmol/L (ref 3.5–5.1)
Sodium: 137 mmol/L (ref 135–145)

## 2017-03-11 LAB — TROPONIN I: Troponin I: <0.03 ng/mL (ref <0.03)

## 2017-03-11 LAB — INFLUENZA PANEL BY PCR (TYPE A & B)
Influenza A By PCR: NEGATIVE
Influenza B By PCR: NEGATIVE

## 2017-03-11 LAB — CBC
HCT: 46.6 % (ref 40.0–52.0)
Hemoglobin: 15.5 g/dL (ref 13.0–18.0)
MCH: 30.9 pg (ref 26.0–34.0)
MCHC: 33.2 g/dL (ref 32.0–36.0)
MCV: 93.3 fL (ref 80.0–100.0)
Platelets: 148 10*3/uL — ABNORMAL LOW (ref 150–440)
RBC: 5 MIL/uL (ref 4.40–5.90)
RDW: 14.9 % — ABNORMAL HIGH (ref 11.5–14.5)
WBC: 5.2 10*3/uL (ref 3.8–10.6)

## 2017-03-11 LAB — BLOOD GAS, VENOUS
Acid-Base Excess: 0.2 mmol/L (ref 0.0–2.0)
Bicarbonate: 28 mmol/L (ref 20.0–28.0)
Patient temperature: 37
pCO2, Ven: 57 mmHg (ref 44.0–60.0)
pH, Ven: 7.3 (ref 7.250–7.430)

## 2017-03-11 MED ORDER — AZITHROMYCIN 500 MG PO TABS
500.0000 mg | ORAL_TABLET | Freq: Every day | ORAL | Status: DC
Start: 2017-03-12 — End: 2017-03-13
  Administered 2017-03-12 – 2017-03-13 (×2): 500 mg via ORAL
  Filled 2017-03-11 (×2): qty 1

## 2017-03-11 MED ORDER — ONDANSETRON HCL 4 MG PO TABS: 4.0000 mg | ORAL_TABLET | Freq: Four times a day (QID) | ORAL | Status: DC | PRN

## 2017-03-11 MED ORDER — TRAMADOL HCL 50 MG PO TABS: 50.0000 mg | ORAL_TABLET | Freq: Four times a day (QID) | ORAL | Status: DC | PRN

## 2017-03-11 MED ORDER — PREDNISOLONE ACETATE 1 % OP SUSP
1.0000 [drp] | Freq: Every day | OPHTHALMIC | Status: DC
  Administered 2017-03-12 – 2017-03-13 (×2): 1 [drp] via OPHTHALMIC
  Filled 2017-03-11: qty 1

## 2017-03-11 MED ORDER — PANTOPRAZOLE SODIUM 40 MG PO TBEC
40.0000 mg | DELAYED_RELEASE_TABLET | Freq: Every day | ORAL | Status: DC
  Administered 2017-03-12 – 2017-03-13 (×2): 40 mg via ORAL
  Filled 2017-03-11 (×2): qty 1

## 2017-03-11 MED ORDER — MOMETASONE FURO-FORMOTEROL FUM 100-5 MCG/ACT IN AERO
2.0000 | INHALATION_SPRAY | Freq: Two times a day (BID) | RESPIRATORY_TRACT | Status: DC
  Administered 2017-03-11 – 2017-03-13 (×4): 2 via RESPIRATORY_TRACT
  Filled 2017-03-11: qty 8.8

## 2017-03-11 MED ORDER — ACETAMINOPHEN 650 MG RE SUPP: 650.0000 mg | Freq: Four times a day (QID) | RECTAL | Status: DC | PRN

## 2017-03-11 MED ORDER — ASPIRIN EC 325 MG PO TBEC
325.0000 mg | DELAYED_RELEASE_TABLET | Freq: Every day | ORAL | Status: DC
  Administered 2017-03-12 – 2017-03-13 (×2): 325 mg via ORAL
  Filled 2017-03-11 (×2): qty 1

## 2017-03-11 MED ORDER — AMLODIPINE BESYLATE 5 MG PO TABS
5.0000 mg | ORAL_TABLET | Freq: Every day | ORAL | Status: DC
  Administered 2017-03-12 – 2017-03-13 (×2): 5 mg via ORAL
  Filled 2017-03-11 (×2): qty 1

## 2017-03-11 MED ORDER — ENOXAPARIN SODIUM 40 MG/0.4ML ~~LOC~~ SOLN
40.0000 mg | SUBCUTANEOUS | Status: DC
  Filled 2017-03-11 (×2): qty 0.4

## 2017-03-11 MED ORDER — METHYLPREDNISOLONE SODIUM SUCC 125 MG IJ SOLR
125.0000 mg | INTRAMUSCULAR | Status: AC
  Administered 2017-03-11: 125 mg via INTRAVENOUS
  Filled 2017-03-11: qty 2

## 2017-03-11 MED ORDER — ALBUTEROL SULFATE (2.5 MG/3ML) 0.083% IN NEBU
2.5000 mg | INHALATION_SOLUTION | RESPIRATORY_TRACT | Status: AC
Start: 2017-03-11 — End: 2017-03-11
  Administered 2017-03-11: 2.5 mg via RESPIRATORY_TRACT
  Filled 2017-03-11: qty 3

## 2017-03-11 MED ORDER — LATANOPROST 0.005 % OP SOLN
1.0000 [drp] | Freq: Every day | OPHTHALMIC | Status: DC
  Administered 2017-03-11 – 2017-03-12 (×2): 1 [drp] via OPHTHALMIC
  Filled 2017-03-11: qty 2.5

## 2017-03-11 MED ORDER — SIMVASTATIN 10 MG PO TABS
20.0000 mg | ORAL_TABLET | Freq: Every day | ORAL | Status: DC
  Administered 2017-03-12 – 2017-03-13 (×2): 20 mg via ORAL
  Filled 2017-03-11 (×2): qty 2

## 2017-03-11 MED ORDER — SENNOSIDES-DOCUSATE SODIUM 8.6-50 MG PO TABS
1.0000 | ORAL_TABLET | Freq: Every evening | ORAL | Status: DC | PRN
  Administered 2017-03-12: 22:00:00 1 via ORAL
  Filled 2017-03-11: qty 1

## 2017-03-11 MED ORDER — LEVOFLOXACIN 750 MG PO TABS
750.0000 mg | ORAL_TABLET | Freq: Once | ORAL | Status: AC
  Administered 2017-03-11: 750 mg via ORAL
  Filled 2017-03-11: qty 1

## 2017-03-11 MED ORDER — IPRATROPIUM-ALBUTEROL 0.5-2.5 (3) MG/3ML IN SOLN
3.0000 mL | RESPIRATORY_TRACT | Status: DC
  Administered 2017-03-11 – 2017-03-13 (×12): 3 mL via RESPIRATORY_TRACT
  Filled 2017-03-11 (×12): qty 3

## 2017-03-11 MED ORDER — ONDANSETRON HCL 4 MG/2ML IJ SOLN: 4.0000 mg | Freq: Four times a day (QID) | INTRAMUSCULAR | Status: DC | PRN

## 2017-03-11 MED ORDER — METHYLPREDNISOLONE SODIUM SUCC 40 MG IJ SOLR
40.0000 mg | Freq: Three times a day (TID) | INTRAMUSCULAR | Status: DC
  Administered 2017-03-11 – 2017-03-13 (×6): 40 mg via INTRAVENOUS
  Filled 2017-03-11 (×6): qty 1

## 2017-03-11 MED ORDER — BRIMONIDINE TARTRATE 0.2 % OP SOLN
1.0000 [drp] | Freq: Two times a day (BID) | OPHTHALMIC | Status: DC
  Administered 2017-03-11 – 2017-03-13 (×4): 1 [drp] via OPHTHALMIC
  Filled 2017-03-11: qty 5

## 2017-03-11 MED ORDER — IPRATROPIUM-ALBUTEROL 0.5-2.5 (3) MG/3ML IN SOLN
3.0000 mL | Freq: Once | RESPIRATORY_TRACT | Status: AC
  Administered 2017-03-11: 3 mL via RESPIRATORY_TRACT
  Filled 2017-03-11: qty 3

## 2017-03-11 MED ORDER — ACETAMINOPHEN 325 MG PO TABS: 650.0000 mg | ORAL_TABLET | Freq: Four times a day (QID) | ORAL | Status: DC | PRN

## 2017-03-11 MED ORDER — TRAZODONE HCL 50 MG PO TABS: 25.0000 mg | ORAL_TABLET | Freq: Every evening | ORAL | Status: DC | PRN

## 2017-03-11 NOTE — H&P (Signed)
[facility] [place] [place] NAME: Johnathan [name]    MR#:  [id number]  DATE OF BIRTH:  [birth year]  DATE OF ADMISSION:  03/11/2017  PRIMARY CARE PHYSICIAN: Jerrol Banana., MD   REQUESTING/REFERRING PHYSICIAN:  Dr Jacqualine Code  CHIEF COMPLAINT:   SOB HISTORY OF PRESENT ILLNESS:  Johnathan [name]  is a 84 y.o. male with a known history of COPD and CAD who presents today with shortness of breath. Patient at baseline always has some wheezing however the past 3 days. Had increasing shortness of breath, wheezing and cough without purulent sputum. He was seen in the emergency room for COPD exacerbation. He has been given IV steroids and nebulizer treatments however skin continues to have diffuse wheezing. Upon ambulation his oxygen saturation decreased to 89% and developed dyspnea.  PAST MEDICAL HISTORY:   Past Medical History:  Diagnosis Date  . Abdominal pain    INTERMITTENT  . ARMD (age related macular degeneration)   . Arthritis    hands  . Asthma   . Atherosclerosis of native arteries of the extremities, unspecified   . CAD (coronary artery disease)    3 vessel/ DR FATH CARDIOLOGIST GAVE CLEARANCE  . Colitis    HX OF  . Constipation   . COPD (chronic obstructive pulmonary disease) ([place])   . GERD (gastroesophageal reflux disease)   . Glaucoma    both eyes/ PATIENT CAN NOT SEE  . HOH (hard of hearing)   . Hyperlipidemia   . Hypertension   . Neuromuscular disorder (HCC)    numbness in feet  . Occlusion and stenosis of carotid artery without mention of cerebral infarction   . Prostate cancer ([place])   . Prostate pain   . Shortness of breath dyspnea   . TIA (transient ischemic attack)    hx of/ 10 yrs ago  . Vertigo   . Wears dentures    upper and lower    PAST SURGICAL HISTORY:   Past Surgical History:  Procedure Laterality Date  . aortoiliac bypass   1986  . CAROTID ENDARTERECTOMY Right 2003  . CATARACT EXTRACTION    . COLONOSCOPY   02-06-13   Dr Candace Cruise  . COLONOSCOPY WITH PROPOFOL N/A 07/09/2015   Procedure: COLONOSCOPY WITH PROPOFOL;  Surgeon: Hulen Luster, MD;  Location: [facility];  Service: Gastroenterology;  Laterality: N/A;  . CORONARY ARTERY BYPASS GRAFT  2000   x3  . HERNIA REPAIR  1992  . PROSTATE SURGERY  2001    SOCIAL HISTORY:   Social History   Tobacco Use  . Smoking status: Former Smoker    Packs/day: 1.00    Years: 30.00    Pack years: 30.00    Types: Cigarettes    Last attempt to quit: 02/16/2003    Years since quitting: 14.0  . Smokeless tobacco: Never Used  . Tobacco comment: is exposed to second hand smoke  Substance Use Topics  . Alcohol use: No    FAMILY HISTORY:   Family History  Problem Relation Age of Onset  . Cirrhosis Mother        liver  . Heart attack Father   . Prostate cancer Paternal Uncle   . Kidney disease Neg Hx   . Kidney cancer Neg Hx   . Bladder Cancer Neg Hx     DRUG ALLERGIES:   Allergies  Allergen Reactions  . Sulfa Antibiotics Other (See Comments)    rash  . Penicillins Rash  Has patient had a PCN reaction causing immediate rash, facial/tongue/throat swelling, SOB or lightheadedness with hypotension: No Has patient had a PCN reaction causing severe rash involving mucus membranes or skin necrosis: No Has patient had a PCN reaction that required hospitalization: No Has patient had a PCN reaction occurring within the last 10 years: No If all of the above answers are "NO", then may proceed with Cephalosporin use.     REVIEW OF SYSTEMS:   Review of Systems  Constitutional: Negative.  Negative for chills, fever and malaise/fatigue.  HENT: Negative.  Negative for ear discharge, ear pain, hearing loss, nosebleeds and sore throat.   Eyes: Negative.  Negative for blurred vision and pain.  Respiratory: Positive for cough, shortness of breath and wheezing. Negative for hemoptysis and sputum production.   Cardiovascular: Negative.  Negative for chest  pain, palpitations and leg swelling.  Gastrointestinal: Negative.  Negative for abdominal pain, blood in stool, diarrhea, nausea and vomiting.  Genitourinary: Negative.  Negative for dysuria.  Musculoskeletal: Negative.  Negative for back pain.  Skin: Negative.   Neurological: Negative for dizziness, tremors, speech change, focal weakness, seizures and headaches.  Endo/Heme/Allergies: Negative.  Does not bruise/bleed easily.  Psychiatric/Behavioral: Negative.  Negative for depression, hallucinations and suicidal ideas.    MEDICATIONS AT HOME:   Prior to Admission medications   Medication Sig Start Date End Date Taking? Authorizing Provider  amLODipine (NORVASC) 5 MG tablet Take 1 tablet (5 mg total) by mouth daily. 10/05/16  Yes Jerrol Banana., MD  aspirin 325 MG EC tablet Take 325 mg by mouth daily.   Yes [provider]  brimonidine (ALPHAGAN) 0.2 % ophthalmic solution Place 1 drop into both eyes 2 (two) times daily.    Yes [provider]  esomeprazole (NEXIUM) 20 MG capsule TAKE 1 CAPSULE TWICE A DAY 06/23/16  Yes Jerrol Banana., MD  latanoprost (XALATAN) 0.005 % ophthalmic solution Place 1 drop into both eyes at bedtime.  01/25/13  Yes [provider]  Polyethylene Glycol 3350 (MIRALAX PO) Take by mouth as needed.   Yes [provider]  prednisoLONE acetate (PRED FORTE) 1 % ophthalmic suspension Place 1 drop into the left eye daily.    Yes [provider]  simvastatin (ZOCOR) 20 MG tablet TAKE 1 TABLET DAILY 07/26/16  Yes Jerrol Banana., MD  meclizine (ANTIVERT) 25 MG tablet Take 1 tablet (25 mg total) by mouth 3 (three) times daily as needed for dizziness. Patient not taking: Reported on 11/09/2016 08/24/16   Lavonia Drafts, MD  traZODone (DESYREL) 50 MG tablet Take 0.5-1 tablets (25-50 mg total) by mouth at bedtime as needed for sleep. Patient not taking: Reported on 08/24/2016 06/01/16   Jerrol Banana., MD       VITAL SIGNS:  Blood pressure (!) 169/73, pulse 61, temperature 97.6 F (36.4 C), temperature source Oral, resp. rate 15, height 5\' 8"  (1.727 m), weight 108.9 kg (240 lb), SpO2 (S) (!) 89 %.  PHYSICAL EXAMINATION:   Physical Exam  Constitutional: He is oriented to person, place, and time and well-developed, well-nourished, and in no distress. No distress.  HENT:  Head: Normocephalic.  Eyes: No scleral icterus.  Neck: Normal range of motion. Neck supple. No JVD present. No tracheal deviation present.  Cardiovascular: Normal rate, regular rhythm and normal heart sounds. Exam reveals no gallop and no friction rub.  No murmur heard. Pulmonary/Chest: Effort normal. No respiratory distress. He has wheezes. He has no rales. He  exhibits no tenderness.  Abdominal: Soft. Bowel sounds are normal. He exhibits no distension and no mass. There is no tenderness. There is no rebound and no guarding.  Musculoskeletal: Normal range of motion. He exhibits no edema.  Neurological: He is alert and oriented to person, place, and time.  Skin: Skin is warm. No rash noted. No erythema.  Psychiatric: Affect and judgment normal.      LABORATORY PANEL:   CBC Recent Labs  Lab 03/11/17 1021  WBC 5.2  HGB 15.5  HCT 46.6  PLT 148*   ------------------------------------------------------------------------------------------------------------------  Chemistries  Recent Labs  Lab 03/11/17 1021  NA 137  K 4.9  CL 102  CO2 26  GLUCOSE 108*  BUN 18  CREATININE 1.29*  CALCIUM 8.6*   ------------------------------------------------------------------------------------------------------------------  Cardiac Enzymes Recent Labs  Lab 03/11/17 1021  TROPONINI <0.03   ------------------------------------------------------------------------------------------------------------------  RADIOLOGY:  Dg Chest 2 View  Result Date: 03/11/2017 CLINICAL DATA:  Shortness of breath. EXAM: CHEST  2 VIEW  COMPARISON:  Chest x-ray 06/01/2016.  CT 07/01/2015. FINDINGS: Mediastinum is stable. Stable tortuous thoracic aorta. Reference is made to prior CT report 07/01/2015. Prior CABG. Cardiomegaly with normal pulmonary vascularity. Mild bibasilar atelectasis and/or scarring. Apical pleural thickening consistent scarring. No pleural effusion or pneumothorax. No acute bony abnormality. IMPRESSION: 1.  Prior CABG.  Cardiomegaly with normal pulmonary vascularity. 2. Mild bibasilar atelectasis and/or scarring. No acute infiltrate noted. Electronically Signed   By: Marcello Moores  Register   On: 03/11/2017 10:48    EKG:   Orders placed or performed during the hospital encounter of 03/11/17  . ED EKG within 10 minutes  . ED EKG within 10 minutes  . EKG 12-Lead  . EKG 12-Lead    IMPRESSION AND PLAN:   84 year old male with history of COPD and CAD who presents with shortness of breath and wheezing.  1. Acute hypoxic respiratory failure in the setting of COPD exacerbation Wean oxygen to room air as tolerated.  2. Acute COPD exacerbation: Start IV steroids, azithromycin for acute bronchitis related to COPD exacerbation and continue duo nebs and start Dulera  3. CAD: Continue aspirin, statin  4. Essential hypertension: Continue Norvasc  5. GERD: Continue PPI    All the records are reviewed and case discussed with ED provider. Management plans discussed with the patient and he is in agreement  CODE STATUS: DO NOT RESUSCITATE  TOTAL TIME TAKING CARE OF THIS PATIENT: 40 minutes.    Kiasha Bellin M.D on 03/11/2017 at 1:33 PM  Between 7am to 6pm - Pager - [phone]  After 6pm go to [email or web address] - password [email or web address]  Office  [phone]  CC: Primary care physician; Jerrol Banana., MD

## 2017-03-11 NOTE — ED Triage Notes (Signed)
Pt arrived via POV with c/o shortness of breath for "quite some while" pt states for the past few days he has noticed increased SOB with movement. Pt is able to speak in short sentences without running out of breath. Audible wheezing noted.   Pt is not on oxygen at home.  Pt has cough that is productive at times.

## 2017-03-11 NOTE — ED Notes (Signed)
Unable to obtain IV at this time, able to get lab work, tubes sent down

## 2017-03-11 NOTE — ED Notes (Signed)
MD made aware of dropping of oxygen saturation while ambulating with the patient in the hallway.  Patient returned to room, placed back in bed, and increased in O2 saturation again to 99% room air.

## 2017-03-11 NOTE — ED Notes (Signed)
Patient states that he is feeling better and feels as though his breathing has improved after his treatments have been completed.  Patient sitting upright and talking well at this time with no shortness of breath noted.

## 2017-03-11 NOTE — Progress Notes (Signed)
Family Meeting Note  Advance Directive:no  Today a meeting took place with the Patient.  The following clinical team members were present during this meeting:MD  The following were discussed:Patient's diagnosis: Acute hypoxic respiratory failure in the setting of acute COPD exacerbation , Patient's progosis: > 12 months and Goals for treatment: DNR  Additional follow-up to be provided: Chaplain consultation for advanced directives  Time spent during discussion: 16 minutes  Silvio Sausedo, MD

## 2017-03-11 NOTE — ED Notes (Signed)
Admission MD at bedside.  

## 2017-03-11 NOTE — Telephone Encounter (Signed)
Patient called complaining of shortness of breath since last night.  He said that it was getting worse and was much worse than when last seen here.  His panting and shortness of breath was audible on the phone.  He said he could not tell if he was having any increased swelling of his hands or lower extremity.   He was advised he needed to be evaluated and instructed that with it being so much worse to go tot he ER.  He had someone with him that would be able to take him and her verbalized he would go now.

## 2017-03-11 NOTE — ED Notes (Signed)
Pt transported to xray 

## 2017-03-11 NOTE — ED Provider Notes (Signed)
[facility] Emergency Department Provider Note   ____________________________________________   First MD Initiated Contact with Patient 03/11/17 1057     (approximate)  I have reviewed the triage vital signs and the nursing notes.   HISTORY  Chief Complaint Shortness of Breath   HPI Johnathan [name] is a 84 y.o. male presents for evaluation of cough with shortness of breath  Patient reports that he wheezes most every day for several years, but he has been recently sick with a cough cold and "runny nose".  He has had increased wheezing and shortness of breath when he is trying to walk.  Does not take any medications for breathing trouble.  Reports that he says that several years ago his doctor told him he thought he had COPD.  He does have a history of smoking and a previous history of coronary disease.  No fevers or chills.  Does report a frequent cough but is relatively nonproductive.   Past Medical History:  Diagnosis Date  . Abdominal pain    INTERMITTENT  . ARMD (age related macular degeneration)   . Arthritis    hands  . Asthma   . Atherosclerosis of native arteries of the extremities, unspecified   . CAD (coronary artery disease)    3 vessel/ DR FATH CARDIOLOGIST GAVE CLEARANCE  . Colitis    HX OF  . Constipation   . COPD (chronic obstructive pulmonary disease) ([place])   . GERD (gastroesophageal reflux disease)   . Glaucoma    both eyes/ PATIENT CAN NOT SEE  . HOH (hard of hearing)   . Hyperlipidemia   . Hypertension   . Neuromuscular disorder (HCC)    numbness in feet  . Occlusion and stenosis of carotid artery without mention of cerebral infarction   . Prostate cancer ([place])   . Prostate pain   . Shortness of breath dyspnea   . TIA (transient ischemic attack)    hx of/ 10 yrs ago  . Vertigo   . Wears dentures    upper and lower    Patient Active Problem List   Diagnosis Date Noted  . Vertigo 06/01/2016  . Dizziness 05/23/2016    . Renal artery stenosis ([place]) 12/29/2015  . Aneurysm of thoracic aorta ([place]) 12/29/2015  . 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 Hydrocele, bilateral 09/09/2014  . Hydrocele sac 08/22/2014  . History of prostate cancer 08/22/2014  . ASCVD (arteriosclerotic cardiovascular disease) 08/06/2014  . Cardiomyopathy, ischemic 08/06/2014  . Prostate cancer ([place]) 08/06/2014  . Obesity 08/06/2014  . Insomnia 08/06/2014  . Neuropathy 08/06/2014  . Abdominal aortic aneurysm ([place]) 08/06/2014  . COPD (chronic obstructive pulmonary disease) ([place]) 08/06/2014  . Hyperglycemia 08/06/2014  . Acid reflux 08/05/2014  . Age-related macular degeneration 08/05/2014  . Branch retinal vein occlusion 08/05/2014  . 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 Hypercholesteremia 08/05/2014  . Pseudoaphakia 08/05/2014  . Secondary glaucoma 08/05/2014  . Osteoarthritis 08/05/2014  . Carotid stenosis 04/02/2014  . PAD (peripheral artery disease) ([place]) 04/02/2014  . 3-vessel CAD 11/06/2013  . 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 BP (high blood pressure) 11/22/2012  . Kidney failure 11/21/2012  . BK (bullous keratopathy) 06/28/2012    Past Surgical History:  Procedure Laterality Date  . aortoiliac bypass   1986  . CAROTID ENDARTERECTOMY Right 2003  . CATARACT EXTRACTION    . COLONOSCOPY  02-06-13   Dr Candace Cruise  . COLONOSCOPY WITH PROPOFOL N/A 07/09/2015   Procedure: COLONOSCOPY WITH PROPOFOL;  Surgeon: Hulen Luster, MD;  Location: [facility];  Service: Gastroenterology;  Laterality: N/A;  . CORONARY ARTERY BYPASS GRAFT  2000   x3  . HERNIA REPAIR  1992  . PROSTATE SURGERY  2001    Prior to Admission medications   Medication Sig Start Date End Date Taking? Authorizing Provider  amLODipine (NORVASC) 5 MG tablet Take 1 tablet (5 mg total) by mouth daily. 10/05/16  Yes Jerrol Banana., MD  aspirin 325 MG EC tablet Take 325 mg by mouth daily.   Yes [provider]  brimonidine  (ALPHAGAN) 0.2 % ophthalmic solution Place 1 drop into both eyes 2 (two) times daily.    Yes [provider]  esomeprazole (NEXIUM) 20 MG capsule TAKE 1 CAPSULE TWICE A DAY 06/23/16  Yes Jerrol Banana., MD  latanoprost (XALATAN) 0.005 % ophthalmic solution Place 1 drop into both eyes at bedtime.  01/25/13  Yes [provider]  Polyethylene Glycol 3350 (MIRALAX PO) Take by mouth as needed.   Yes [provider]  prednisoLONE acetate (PRED FORTE) 1 % ophthalmic suspension Place 1 drop into the left eye daily.    Yes [provider]  simvastatin (ZOCOR) 20 MG tablet TAKE 1 TABLET DAILY 07/26/16  Yes Jerrol Banana., MD  meclizine (ANTIVERT) 25 MG tablet Take 1 tablet (25 mg total) by mouth 3 (three) times daily as needed for dizziness. Patient not taking: Reported on 11/09/2016 08/24/16   Lavonia Drafts, MD  traZODone (DESYREL) 50 MG tablet Take 0.5-1 tablets (25-50 mg total) by mouth at bedtime as needed for sleep. Patient not taking: Reported on 08/24/2016 06/01/16   Jerrol Banana., MD    Allergies Sulfa antibiotics and Penicillins  Family History  Problem Relation Age of Onset  . Cirrhosis Mother        liver  . Heart attack Father   . Prostate cancer Paternal Uncle   . Kidney disease Neg Hx   . Kidney cancer Neg Hx   . Bladder Cancer Neg Hx     Social History Social History   Tobacco Use  . Smoking status: Former Smoker    Packs/day: 1.00    Years: 30.00    Pack years: 30.00    Types: Cigarettes    Last attempt to quit: 02/16/2003    Years since quitting: 14.0  . Smokeless tobacco: Never Used  . Tobacco comment: is exposed to second hand smoke  Substance Use Topics  . Alcohol use: No  . Drug use: No    Review of Systems Constitutional: No fever/chills Eyes: No visual changes. ENT: No sore throat.  Runny nose and congestion Cardiovascular: Denies chest pain. Respiratory: See HPI  gastrointestinal: No abdominal pain.   No nausea, no vomiting.  No diarrhea.  No constipation. Genitourinary: Negative for dysuria. Musculoskeletal: Negative for back pain. Skin: Negative for rash. Neurological: Negative for headaches, focal weakness or numbness.    ____________________________________________   PHYSICAL EXAM:  VITAL SIGNS: ED Triage Vitals  Enc Vitals Group     BP 03/11/17 1012 131/60     Pulse Rate 03/11/17 1012 61     Resp 03/11/17 1012 (!) 22     Temp 03/11/17 1012 97.6 F (36.4 C)     Temp Source 03/11/17 1012 Oral     SpO2 03/11/17 1012 96 %     Weight 03/11/17 1012 240 lb (108.9 kg)     Height 03/11/17 1012 5\' 8"  (1.727 m)  Head Circumference --      Peak Flow --      Pain Score 03/11/17 1011 0     Pain Loc --      Pain Edu? --      Excl. in [place]? --     Constitutional: Alert and oriented.  Lightly dyspneic with mild audible wheezing noted.  Very pleasant no acute distress Eyes: Conjunctivae are normal. Head: Atraumatic. Nose: No congestion/rhinnorhea. Mouth/Throat: Mucous membranes are moist. Neck: No stridor.   Cardiovascular: Normal rate, irregular rhythm. Grossly normal heart sounds.  Good peripheral circulation. Respiratory:  No retractions.  Speaks in short phrases, but notable end expiratory wheezing throughout.  Some mild use of accessory muscles.  Normal respiratory rate.  No tripoding gastrointestinal: Soft and nontender. No distention. Musculoskeletal: No lower extremity tenderness nor edema. Neurologic:  Normal speech and language. No gross focal neurologic deficits are appreciated.  Skin:  Skin is warm, dry and intact. No rash noted. Psychiatric: Mood and affect are normal. Speech and behavior are normal.  ____________________________________________   LABS (all labs ordered are listed, but only abnormal results are displayed)  Labs Reviewed  BASIC METABOLIC PANEL - Abnormal; Notable for the following components:      Result Value   Glucose, Bld 108 (*)     Creatinine, Ser 1.29 (*)    Calcium 8.6 (*)    GFR calc non Af Amer 47 (*)    GFR calc Af Amer 55 (*)    All other components within normal limits  CBC - Abnormal; Notable for the following components:   RDW 14.9 (*)    Platelets 148 (*)    All other components within normal limits  CULTURE, BLOOD (ROUTINE X 2)  CULTURE, BLOOD (ROUTINE X 2)  TROPONIN I  BLOOD GAS, VENOUS  INFLUENZA PANEL BY PCR (TYPE A & B)   ____________________________________________  EKG  Reviewed and interpreted by me at 10:30 AM Heart rate 60 qrs 120 probable atrial flutter, baseline somewhat hard to determine, minimal T wave abnormality noted  12:45 AM Heart rate 82 QRS 130 QTC 460 Atrial flutter, primarily 3-1 block, without evidence of obvious ischemic change except for persistence of left lateral chest lead inversion  Previous EKG from July 2018 appears similar ____________________________________________  RADIOLOGY  Dg Chest 2 View  Result Date: 03/11/2017 CLINICAL DATA:  Shortness of breath. EXAM: CHEST  2 VIEW COMPARISON:  Chest x-ray 06/01/2016.  CT 07/01/2015. FINDINGS: Mediastinum is stable. Stable tortuous thoracic aorta. Reference is made to prior CT report 07/01/2015. Prior CABG. Cardiomegaly with normal pulmonary vascularity. Mild bibasilar atelectasis and/or scarring. Apical pleural thickening consistent scarring. No pleural effusion or pneumothorax. No acute bony abnormality. IMPRESSION: 1.  Prior CABG.  Cardiomegaly with normal pulmonary vascularity. 2. Mild bibasilar atelectasis and/or scarring. No acute infiltrate noted. Electronically Signed   By: Marcello Moores  Register   On: 03/11/2017 10:48    ____________________________________________   PROCEDURES  Procedure(s) performed: None  Procedures  Critical Care performed: No  ____________________________________________   INITIAL IMPRESSION / ASSESSMENT AND PLAN / ED COURSE  Pertinent labs & imaging results that were available  during my care of the patient were reviewed by me and considered in my medical decision making (see chart for details).  Patient presents for evaluation of dyspnea.  Noted to have notable end expiratory wheezing in all fields, recent upper respiratory symptoms.  Afebrile, reassuring lab work, chest x-ray did know it is no acute findings though I question if there may  be some subtle lucency along the left heart border and possibly a very early infiltrate in this region.  Give nebulizers, COPD treatment, Levaquin for community-acquired pneumonia/COPD exacerbation closely.  Denies acute cardiac symptoms no pleuritic chest pain, no signs or symptoms suggest pulmonary embolism.  ----------------------------------------- 1:29 PM on 03/11/2017 -----------------------------------------  Patient ambulated, pulse ox 89% on room air.  Did develop increased dyspnea.  Given additional albuterol thereafter.  At this point, given ongoing increased work of breathing patient's ongoing wheezing and mild dyspnea which seems to be slowly improving we will admit him for further observation and care for apparent COPD exacerbation.  Further care signout given to Dr. Benjie Karvonen of hospitalist service      ____________________________________________   FINAL CLINICAL IMPRESSION(S) / ED DIAGNOSES  Final diagnoses:  COPD exacerbation ([place])  Upper respiratory tract infection, unspecified type      NEW MEDICATIONS STARTED DURING THIS VISIT:  New Prescriptions   No medications on file     Note:  This document was prepared using Dragon voice recognition software and may include unintentional dictation errors.     Delman Kitten, MD 03/11/17 1330

## 2017-03-11 NOTE — Progress Notes (Signed)
°   03/11/17 1625  Clinical Encounter Type  Visited With Other (Comment)  Visit Type Follow-up  Referral From Nurse  Consult/Referral To Chaplain  Spiritual Encounters  Spiritual Needs Other (Comment)   PT declined AD

## 2017-03-12 LAB — BASIC METABOLIC PANEL
Anion gap: 9 (ref 5–15)
BUN: 19 mg/dL (ref 6–20)
CO2: 24 mmol/L (ref 22–32)
Calcium: 8.8 mg/dL — ABNORMAL LOW (ref 8.9–10.3)
Chloride: 104 mmol/L (ref 101–111)
Creatinine, Ser: 1.37 mg/dL — ABNORMAL HIGH (ref 0.61–1.24)
GFR calc Af Amer: 51 mL/min — ABNORMAL LOW (ref >60)
GFR calc non Af Amer: 44 mL/min — ABNORMAL LOW (ref >60)
Glucose, Bld: 176 mg/dL — ABNORMAL HIGH (ref 65–99)
Potassium: 4.1 mmol/L (ref 3.5–5.1)
Sodium: 137 mmol/L (ref 135–145)

## 2017-03-12 LAB — CBC
HCT: 40 % (ref 40.0–52.0)
Hemoglobin: 13.7 g/dL (ref 13.0–18.0)
MCH: 31.7 pg (ref 26.0–34.0)
MCHC: 34.2 g/dL (ref 32.0–36.0)
MCV: 92.6 fL (ref 80.0–100.0)
Platelets: 150 10*3/uL (ref 150–440)
RBC: 4.32 MIL/uL — ABNORMAL LOW (ref 4.40–5.90)
RDW: 15 % — ABNORMAL HIGH (ref 11.5–14.5)
WBC: 5.2 10*3/uL (ref 3.8–10.6)

## 2017-03-12 MED ORDER — MENTHOL 3 MG MT LOZG
1.0000 | LOZENGE | OROMUCOSAL | Status: DC | PRN
  Filled 2017-03-12: qty 9

## 2017-03-12 NOTE — Progress Notes (Signed)
[facility] [place] NAME: Johnathan [name]    MR#:  [id number]  DATE OF BIRTH:  November 10, 1925  SUBJECTIVE:  CHIEF COMPLAINT: Patient shortness of breath is better, had smoking history for 60 years, quit smoking 14 years ago  REVIEW OF SYSTEMS:  CONSTITUTIONAL: No fever, fatigue or weakness.  EYES: No blurred or double vision.  EARS, NOSE, AND THROAT: No tinnitus or ear pain.  RESPIRATORY: Improving cough, shortness of breath, denies wheezing or hemoptysis.  CARDIOVASCULAR: No chest pain, orthopnea, edema.  GASTROINTESTINAL: No nausea, vomiting, diarrhea or abdominal pain.  GENITOURINARY: No dysuria, hematuria.  ENDOCRINE: No polyuria, nocturia,  HEMATOLOGY: No anemia, easy bruising or bleeding SKIN: No rash or lesion. MUSCULOSKELETAL: No joint pain or arthritis.   NEUROLOGIC: No tingling, numbness, weakness.  PSYCHIATRY: No anxiety or depression.   DRUG ALLERGIES:   Allergies  Allergen Reactions  . Sulfa Antibiotics Other (See Comments)    rash  . Penicillins Rash    Has patient had a PCN reaction causing immediate rash, facial/tongue/throat swelling, SOB or lightheadedness with hypotension: No Has patient had a PCN reaction causing severe rash involving mucus membranes or skin necrosis: No Has patient had a PCN reaction that required hospitalization: No Has patient had a PCN reaction occurring within the last 10 years: No If all of the above answers are "NO", then may proceed with Cephalosporin use.     VITALS:  Blood pressure (!) 132/55, pulse 95, temperature 98.7 F (37.1 C), resp. rate 20, height 5\' 8"  (1.727 m), weight 108.9 kg (240 lb), SpO2 96 %.  PHYSICAL EXAMINATION:  GENERAL:  84 y.o.-year-old patient lying in the bed with no acute distress.  EYES: Pupils equal, round, reactive to light and accommodation. No scleral icterus. Extraocular muscles intact.  HEENT: Head atraumatic, normocephalic. Oropharynx and nasopharynx  clear.  NECK:  Supple, no jugular venous distention. No thyroid enlargement, no tenderness.  LUNGS: Moderate breath sounds bilaterally, no wheezing, rales,rhonchi or crepitation. No use of accessory muscles of respiration.  CARDIOVASCULAR: S1, S2 normal. No murmurs, rubs, or gallops.  ABDOMEN: Soft, nontender, nondistended. Bowel sounds present. No organomegaly or mass.  EXTREMITIES: No pedal edema, cyanosis, or clubbing.  NEUROLOGIC: Cranial nerves II through XII are intact. Muscle strength 5/5 in all extremities. Sensation intact. Gait not checked.  PSYCHIATRIC: The patient is alert and oriented x 3.  SKIN: No obvious rash, lesion, or ulcer.    LABORATORY PANEL:   CBC Recent Labs  Lab 03/12/17 0418  WBC 5.2  HGB 13.7  HCT 40.0  PLT 150   ------------------------------------------------------------------------------------------------------------------  Chemistries  Recent Labs  Lab 03/12/17 0418  NA 137  K 4.1  CL 104  CO2 24  GLUCOSE 176*  BUN 19  CREATININE 1.37*  CALCIUM 8.8*   ------------------------------------------------------------------------------------------------------------------  Cardiac Enzymes Recent Labs  Lab 03/11/17 1021  TROPONINI <0.03   ------------------------------------------------------------------------------------------------------------------  RADIOLOGY:  Dg Chest 2 View  Result Date: 03/11/2017 CLINICAL DATA:  Shortness of breath. EXAM: CHEST  2 VIEW COMPARISON:  Chest x-ray 06/01/2016.  CT 07/01/2015. FINDINGS: Mediastinum is stable. Stable tortuous thoracic aorta. Reference is made to prior CT report 07/01/2015. Prior CABG. Cardiomegaly with normal pulmonary vascularity. Mild bibasilar atelectasis and/or scarring. Apical pleural thickening consistent scarring. No pleural effusion or pneumothorax. No acute bony abnormality. IMPRESSION: 1.  Prior CABG.  Cardiomegaly with normal pulmonary vascularity. 2. Mild bibasilar atelectasis  and/or scarring. No acute infiltrate noted. Electronically Signed   By: Marcello Moores  Register   On: 03/11/2017 10:48    EKG:   Orders placed or performed during the hospital encounter of 03/11/17  . ED EKG within 10 minutes  . ED EKG within 10 minutes  . EKG 12-Lead  . EKG 12-Lead    ASSESSMENT AND PLAN:    1. Acute hypoxic respiratory failure in the setting of COPD exacerbation Wean oxygen to room air as tolerated.  2. Acute COPD exacerbation: Clinically improving, taper IV steroids, azithromycin for acute bronchitis related to COPD exacerbation and continue duo nebs and start Dulera  3. CAD: Continue aspirin, statin  4. Essential hypertension: Continue Norvasc  5. GERD: Continue PPI      All the records are reviewed and case discussed with Care Management/Social Workerr. Management plans discussed with the patient, family and they are in agreement.  CODE STATUS: DO NOT RESUSCITATE  TOTAL TIME TAKING CARE OF THIS PATIENT: 35 minutes.   POSSIBLE D/C IN 2 DAYS, DEPENDING ON CLINICAL CONDITION.  Note: This dictation was prepared with Dragon dictation along with smaller phrase technology. Any transcriptional errors that result from this process are unintentional.   Nicholes Mango M.D on 03/12/2017 at 3:45 PM  Between 7am to 6pm - Pager - [phone] After 6pm go to [email or web address] - password [email or web address] [facility]  [place] [facility]  Office  [phone]  CC: Primary care physician; Jerrol Banana., MD

## 2017-03-13 LAB — CBC
HCT: 41.8 % (ref 40.0–52.0)
Hemoglobin: 14 g/dL (ref 13.0–18.0)
MCH: 31.1 pg (ref 26.0–34.0)
MCHC: 33.5 g/dL (ref 32.0–36.0)
MCV: 93 fL (ref 80.0–100.0)
Platelets: 152 10*3/uL (ref 150–440)
RBC: 4.49 MIL/uL (ref 4.40–5.90)
RDW: 15.2 % — ABNORMAL HIGH (ref 11.5–14.5)
WBC: 7.7 10*3/uL (ref 3.8–10.6)

## 2017-03-13 LAB — BASIC METABOLIC PANEL
Anion gap: 10 (ref 5–15)
BUN: 28 mg/dL — ABNORMAL HIGH (ref 6–20)
CO2: 23 mmol/L (ref 22–32)
Calcium: 8.5 mg/dL — ABNORMAL LOW (ref 8.9–10.3)
Chloride: 106 mmol/L (ref 101–111)
Creatinine, Ser: 1.35 mg/dL — ABNORMAL HIGH (ref 0.61–1.24)
GFR calc Af Amer: 52 mL/min — ABNORMAL LOW (ref >60)
GFR calc non Af Amer: 45 mL/min — ABNORMAL LOW (ref >60)
Glucose, Bld: 164 mg/dL — ABNORMAL HIGH (ref 65–99)
Potassium: 4.2 mmol/L (ref 3.5–5.1)
Sodium: 139 mmol/L (ref 135–145)

## 2017-03-13 MED ORDER — AZITHROMYCIN 250 MG PO TABS: ORAL_TABLET | ORAL | 0 refills | Status: DC

## 2017-03-13 MED ORDER — ALBUTEROL SULFATE HFA 108 (90 BASE) MCG/ACT IN AERS: 2.0000 | INHALATION_SPRAY | Freq: Four times a day (QID) | RESPIRATORY_TRACT | 1 refills | Status: AC | PRN

## 2017-03-13 MED ORDER — FLUTICASONE-SALMETEROL 100-50 MCG/DOSE IN AEPB: 1.0000 | INHALATION_SPRAY | Freq: Two times a day (BID) | RESPIRATORY_TRACT | 1 refills | Status: DC

## 2017-03-13 MED ORDER — PREDNISONE 10 MG (21) PO TBPK: 10.0000 mg | ORAL_TABLET | Freq: Every day | ORAL | 0 refills | Status: DC

## 2017-03-13 NOTE — Progress Notes (Signed)
Patient is to be discharged home today. Patient is in no acute distress at this time, and assessment is unchanged from this morning. Patient's IV is out, discharge paperwork has been discussed with patient/family and there are no questions or concerns at this time. Patient will be accompanied downstairs by staff and family via wheelchair.   

## 2017-03-13 NOTE — Discharge Instructions (Signed)
Follow-up with primary care physician in 5-7 days

## 2017-03-13 NOTE — Discharge Summary (Signed)
[facility] [place] NAME: Johnathan [name]    MR#:  [id number]  DATE OF BIRTH:  08/24/1925  DATE OF ADMISSION:  03/11/2017 ADMITTING PHYSICIAN: Bettey Costa, MD  DATE OF DISCHARGE:  03/13/17  PRIMARY CARE PHYSICIAN: Jerrol Banana., MD    ADMISSION DIAGNOSIS:  COPD exacerbation ([place]) [J44.1] Upper respiratory tract infection, unspecified type [J06.9]  DISCHARGE DIAGNOSIS:  COPD exacerbation  SECONDARY DIAGNOSIS:   Past Medical History:  Diagnosis Date  . Abdominal pain    INTERMITTENT  . ARMD (age related macular degeneration)   . Arthritis    hands  . Asthma   . Atherosclerosis of native arteries of the extremities, unspecified   . CAD (coronary artery disease)    3 vessel/ DR FATH CARDIOLOGIST GAVE CLEARANCE  . Colitis    HX OF  . Constipation   . COPD (chronic obstructive pulmonary disease) ([place])   . GERD (gastroesophageal reflux disease)   . Glaucoma    both eyes/ PATIENT CAN NOT SEE  . HOH (hard of hearing)   . Hyperlipidemia   . Hypertension   . Neuromuscular disorder (H[facility])    numbness in feet  . Occlusion and stenosis of carotid artery without mention of cerebral infarction   . Prostate cancer ([place])   . Prostate pain   . Shortness of breath dyspnea   . TIA (transient ischemic attack)    hx of/ 10 yrs ago  . Vertigo   . Wears dentures    upper and lower    HOSPITAL COURSE:   HPI  Johnathan [name]  is a 84 y.o. male with a known history of COPD and CAD who presents today with shortness of breath. Patient at baseline always has some wheezing however the past 3 days. Had increasing shortness of breath, wheezing and cough without purulent sputum. He was seen in the emergency room for COPD exacerbation. He has been given IV steroids and nebulizer treatments however skin continues to have diffuse wheezing. Upon ambulation his oxygen saturation decreased to 89% and developed dyspnea  1. Acute hypoxic respiratory  failure in the setting of COPD exacerbation Weaned off oxygen to room air as tolerated. Clinically doing much better. Blood cultures negative so far  2. Acute COPD exacerbation: Clinically improving, tapered IV steroids, azithromycin for acute bronchitis related to COPD exacerbation and continue  Dulera  3. CAD: Continue aspirin, statin  4. Essential hypertension: Continue Norvasc  5. GERD: Continue PPI  Discharge patient home   DISCHARGE CONDITIONS:   STABLE   CONSULTS OBTAINED:     PROCEDURES  NONE   DRUG ALLERGIES:   Allergies  Allergen Reactions  . Sulfa Antibiotics Other (See Comments)    rash  . Penicillins Rash    Has patient had a PCN reaction causing immediate rash, facial/tongue/throat swelling, SOB or lightheadedness with hypotension: No Has patient had a PCN reaction causing severe rash involving mucus membranes or skin necrosis: No Has patient had a PCN reaction that required hospitalization: No Has patient had a PCN reaction occurring within the last 10 years: No If all of the above answers are "NO", then may proceed with Cephalosporin use.     DISCHARGE MEDICATIONS:   Allergies as of 03/13/2017      Reactions   Sulfa Antibiotics Other (See Comments)   rash   Penicillins Rash   Has patient had a PCN reaction causing immediate rash, facial/tongue/throat swelling, SOB or lightheadedness with hypotension: No Has  patient had a PCN reaction causing severe rash involving mucus membranes or skin necrosis: No Has patient had a PCN reaction that required hospitalization: No Has patient had a PCN reaction occurring within the last 10 years: No If all of the above answers are "NO", then may proceed with Cephalosporin use.      Medication List    STOP taking these medications   meclizine 25 MG tablet Commonly known as:  ANTIVERT     TAKE these medications   albuterol 108 (90 Base) MCG/ACT inhaler Commonly known as:  PROVENTIL HFA;VENTOLIN  HFA Inhale 2 puffs into the lungs every 6 (six) hours as needed for wheezing or shortness of breath.   amLODipine 5 MG tablet Commonly known as:  NORVASC Take 1 tablet (5 mg total) by mouth daily.   aspirin 325 MG EC tablet Take 325 mg by mouth daily.   azithromycin 250 MG tablet Commonly known as:  ZITHROMAX Take 1 tablet p.o. once daily starting from tomorrow 03/14/2017   brimonidine 0.2 % ophthalmic solution Commonly known as:  ALPHAGAN Place 1 drop into both eyes 2 (two) times daily.   esomeprazole 20 MG capsule Commonly known as:  NEXIUM TAKE 1 CAPSULE TWICE A DAY   Fluticasone-Salmeterol 100-50 MCG/DOSE Aepb Commonly known as:  ADVAIR DISKUS Inhale 1 puff into the lungs 2 (two) times daily.   latanoprost 0.005 % ophthalmic solution Commonly known as:  XALATAN Place 1 drop into both eyes at bedtime.   MIRALAX PO Take by mouth as needed.   prednisoLONE acetate 1 % ophthalmic suspension Commonly known as:  PRED FORTE Place 1 drop into the left eye daily.   predniSONE 10 MG (21) Tbpk tablet Commonly known as:  STERAPRED UNI-PAK 21 TAB Take 1 tablet (10 mg total) by mouth daily. Take 6 tablets by mouth for 1 day followed by  5 tablets by mouth for 1 day followed by  4 tablets by mouth for 1 day followed by  3 tablets by mouth for 1 day followed by  2 tablets by mouth for 1 day followed by  1 tablet by mouth for a day and stop   simvastatin 20 MG tablet Commonly known as:  ZOCOR TAKE 1 TABLET DAILY   traZODone 50 MG tablet Commonly known as:  DESYREL Take 0.5-1 tablets (25-50 mg total) by mouth at bedtime as needed for sleep.        DISCHARGE INSTRUCTIONS:  Follow-up with primary care physician in a week   DIET:  Cardiac diet  DISCHARGE CONDITION:  Stable  ACTIVITY:  Activity as tolerated  OXYGEN:  Home Oxygen: No.   Oxygen Delivery: room air  DISCHARGE LOCATION:  home   If you experience worsening of your admission symptoms, develop  shortness of breath, life threatening emergency, suicidal or homicidal thoughts you must seek medical attention immediately by calling 911 or calling your MD immediately  if symptoms less severe.  You Must read complete instructions/literature along with all the possible adverse reactions/side effects for all the Medicines you take and that have been prescribed to you. Take any new Medicines after you have completely understood and accpet all the possible adverse reactions/side effects.   Please note  You were cared for by a hospitalist during your hospital stay. If you have any questions about your discharge medications or the care you received while you were in the hospital after you are discharged, you can call the unit and asked to speak with the hospitalist on call  if the hospitalist that took care of you is not available. Once you are discharged, your primary care physician will handle any further medical issues. Please note that NO REFILLS for any discharge medications will be authorized once you are discharged, as it is imperative that you return to your primary care physician (or establish a relationship with a primary care physician if you do not have one) for your aftercare needs so that they can reassess your need for medications and monitor your lab values.     Today  Chief Complaint  Patient presents with  . Shortness of Breath   Patient shortness of breath significantly improved.  Cough improved.  Wants to go home. Patient lives with his son.  Daughter at bedside  ROS:  CONSTITUTIONAL: Denies fevers, chills. Denies any fatigue, weakness.  EYES: Denies blurry vision, double vision, eye pain. EARS, NOSE, THROAT: Denies tinnitus, ear pain, hearing loss. RESPIRATORY:  improving cough, denies wheeze, shortness of breath.  CARDIOVASCULAR: Denies chest pain, palpitations, edema.  GASTROINTESTINAL: Denies nausea, vomiting, diarrhea, abdominal pain. Denies bright red blood per  rectum. GENITOURINARY: Denies dysuria, hematuria. ENDOCRINE: Denies nocturia or thyroid problems. HEMATOLOGIC AND LYMPHATIC: Denies easy bruising or bleeding. SKIN: Denies rash or lesion. MUSCULOSKELETAL: Denies pain in neck, back, shoulder, knees, hips or arthritic symptoms.  NEUROLOGIC: Denies paralysis, paresthesias.  PSYCHIATRIC: Denies anxiety or depressive symptoms.   VITAL SIGNS:  Blood pressure (!) 154/65, pulse (!) 52, temperature 97.7 F (36.5 C), temperature source Oral, resp. rate 18, height 5\' 8"  (1.727 m), weight 108.9 kg (240 lb), SpO2 95 %.  I/O:    Intake/Output Summary (Last 24 hours) at 03/13/2017 1249 Last data filed at 03/13/2017 0800 Gross per 24 hour  Intake 480 ml  Output -  Net 480 ml    PHYSICAL EXAMINATION:  GENERAL:  84 y.o.-year-old patient lying in the bed with no acute distress.  EYES: Pupils equal, round, reactive to light and accommodation. No scleral icterus. Extraocular muscles intact.  HEENT: Head atraumatic, normocephalic. Oropharynx and nasopharynx clear.  NECK:  Supple, no jugular venous distention. No thyroid enlargement, no tenderness.  LUNGS: Normal breath sounds bilaterally, no wheezing, rales,rhonchi or crepitation. No use of accessory muscles of respiration.  CARDIOVASCULAR: S1, S2 normal. No murmurs, rubs, or gallops.  ABDOMEN: Soft, non-tender, non-distended. Bowel sounds present. No organomegaly or mass.  EXTREMITIES: No pedal edema, cyanosis, or clubbing.  NEUROLOGIC: Cranial nerves II through XII are intact. Muscle strength 5/5 in all extremities. Sensation intact. Gait not checked.  PSYCHIATRIC: The patient is alert and oriented x 3.  SKIN: No obvious rash, lesion, or ulcer.   DATA REVIEW:   CBC Recent Labs  Lab 03/13/17 0417  WBC 7.7  HGB 14.0  HCT 41.8  PLT 152    Chemistries  Recent Labs  Lab 03/13/17 0417  NA 139  K 4.2  CL 106  CO2 23  GLUCOSE 164*  BUN 28*  CREATININE 1.35*  CALCIUM 8.5*    Cardiac  Enzymes Recent Labs  Lab 03/11/17 1021  TROPONINI <0.03    Microbiology Results  Results for orders placed or performed during the hospital encounter of 03/11/17  Blood Culture (routine x 2)     Status: None (Preliminary result)   Collection Time: 03/11/17 11:08 AM  Result Value Ref Range Status   Specimen Description BLOOD Blood Culture adequate volume  Final   Special Requests LEFT ANTECUBITAL  Final   Culture   Final    NO GROWTH 2 DAYS  Performed at [facility], [address] [place], [place] [address]    Report Status PENDING  Incomplete  Blood Culture (routine x 2)     Status: None (Preliminary result)   Collection Time: 03/11/17 11:08 AM  Result Value Ref Range Status   Specimen Description BLOOD Blood Culture adequate volume  Final   Special Requests BLOOD RIGHT HAND  Final   Culture   Final    NO GROWTH 2 DAYS Performed at [facility], [address] [place], [place] [address]    Report Status PENDING  Incomplete    RADIOLOGY:  Dg Chest 2 View  Result Date: 03/11/2017 CLINICAL DATA:  Shortness of breath. EXAM: CHEST  2 VIEW COMPARISON:  Chest x-ray 06/01/2016.  CT 07/01/2015. FINDINGS: Mediastinum is stable. Stable tortuous thoracic aorta. Reference is made to prior CT report 07/01/2015. Prior CABG. Cardiomegaly with normal pulmonary vascularity. Mild bibasilar atelectasis and/or scarring. Apical pleural thickening consistent scarring. No pleural effusion or pneumothorax. No acute bony abnormality. IMPRESSION: 1.  Prior CABG.  Cardiomegaly with normal pulmonary vascularity. 2. Mild bibasilar atelectasis and/or scarring. No acute infiltrate noted. Electronically Signed   By: Marcello Moores  Register   On: 03/11/2017 10:48    EKG:   Orders placed or performed during the hospital encounter of 03/11/17  . ED EKG within 10 minutes  . ED EKG within 10 minutes  . EKG 12-Lead  . EKG 12-Lead      Management plans discussed with the patient, family and  they are in agreement.  CODE STATUS:     Code Status Orders  (From admission, onward)        Start     Ordered   03/11/17 1418  Full code  Continuous     03/11/17 1417    Code Status History    Date Active Date Inactive Code Status Order ID Comments User Context   05/23/2016 23:19 05/24/2016 20:56 Full Code [id number]  Lance Coon, MD Inpatient    Advance Directive Documentation     Most Recent Value  Type of Advance Directive  Living will  Pre-existing out of facility DNR order (yellow form or pink MOST form)  No data  "MOST" Form in Place?  No data      TOTAL TIME TAKING CARE OF THIS PATIENT: 43 minutes.   Note: This dictation was prepared with Dragon dictation along with smaller phrase technology. Any transcriptional errors that result from this process are unintentional.   @[facility] @  on 03/13/2017 at 12:49 PM  Between 7am to 6pm - Pager - [phone]  After 6pm go to [email or web address] - password [email or web address] [facility]  [place] [facility]  Office  [phone]  CC: Primary care physician; Jerrol Banana., MD

## 2017-03-14 ENCOUNTER — Other Ambulatory Visit: Payer: Self-pay

## 2017-03-14 ENCOUNTER — Ambulatory Visit (INDEPENDENT_AMBULATORY_CARE_PROVIDER_SITE_OTHER): Payer: Medicare Other | Admitting: Family Medicine

## 2017-03-14 ENCOUNTER — Telehealth: Payer: Self-pay

## 2017-03-14 VITALS — BP 134/76 | HR 90

## 2017-03-14 DIAGNOSIS — R0902 Hypoxemia: Secondary | ICD-10-CM

## 2017-03-14 DIAGNOSIS — I2510 Atherosclerotic heart disease of native coronary artery without angina pectoris: Secondary | ICD-10-CM

## 2017-03-14 DIAGNOSIS — J441 Chronic obstructive pulmonary disease with (acute) exacerbation: Secondary | ICD-10-CM

## 2017-03-14 MED ORDER — DOXYCYCLINE HYCLATE 100 MG PO TABS: 100.0000 mg | ORAL_TABLET | Freq: Two times a day (BID) | ORAL | 0 refills | Status: DC

## 2017-03-14 NOTE — Progress Notes (Signed)
Johnathan [name]  MRN: [id number] DOB: 04-Sep-1925  Subjective:  HPI   The patient is an 84 year old male who presents for follow up to his hospitalization.   Admitted: 03/11/17  Discharged: 03/13/17 Admitting diagnosis: COPD exacerbation and URI Discharge diagnosis: COPD exacerbation In patient testing CXR 1.  Prior CABG.  Cardiomegaly with normal pulmonary vascularity. 2. Mild bibasilar atelectasis and/or scarring. No acute infiltrate noted Lab Results  Component Value Date   NA 139 03/13/2017   K 4.2 03/13/2017   CL 106 03/13/2017   CO2 23 03/13/2017   Lab Results  Component Value Date   CREATININE 1.35 (H) 03/13/2017   CBC Latest Ref Rng & Units 03/13/2017 03/12/2017 03/11/2017  WBC 3.8 - 10.6 K/uL 7.7 5.2 5.2  Hemoglobin 13.0 - 18.0 g/dL 14.0 13.7 15.5  Hematocrit 40.0 - 52.0 % 41.8 40.0 46.6  Platelets 150 - 440 K/uL 152 150 148(L)   Blood culture, blood gases and Troponin were all negative.  Patient was instructed to stop Antivert and take the following  albuterol 108 (90 Base) MCG/ACT inhaler Commonly known as:  PROVENTIL HFA;VENTOLIN HFA Inhale 2 puffs into the lungs every 6 (six) hours as needed for wheezing or shortness of breath.   amLODipine 5 MG tablet Commonly known as:  NORVASC Take 1 tablet (5 mg total) by mouth daily.   aspirin 325 MG EC tablet Take 325 mg by mouth daily.   azithromycin 250 MG tablet Commonly known as:  ZITHROMAX Take 1 tablet p.o. once daily starting from tomorrow 03/14/2017   brimonidine 0.2 % ophthalmic solution Commonly known as:  ALPHAGAN Place 1 drop into both eyes 2 (two) times daily.   esomeprazole 20 MG capsule Commonly known as:  NEXIUM TAKE 1 CAPSULE TWICE A DAY   Fluticasone-Salmeterol 100-50 MCG/DOSE Aepb Commonly known as:  ADVAIR DISKUS Inhale 1 puff into the lungs 2 (two) times daily.   latanoprost 0.005 % ophthalmic solution Commonly known as:  XALATAN Place 1 drop into both eyes at bedtime.     MIRALAX PO Take by mouth as needed.   prednisoLONE acetate 1 % ophthalmic suspension Commonly known as:  PRED FORTE Place 1 drop into the left eye daily.   predniSONE 10 MG (21) Tbpk tablet Commonly known as:  STERAPRED UNI-PAK 21 TAB Take 1 tablet (10 mg total) by mouth daily. Take 6 tablets by mouth for 1 day followed by  5 tablets by mouth for 1 day followed by  4 tablets by mouth for 1 day followed by  3 tablets by mouth for 1 day followed by  2 tablets by mouth for 1 day followed by  1 tablet by mouth for a day and stop   simvastatin 20 MG tablet Commonly known as:  ZOCOR TAKE 1 TABLET DAILY   traZODone 50 MG tablet Commonly known as:  DESYREL Take 0.5-1 tablets (25-50 mg total) by mouth at bedtime as needed for sleep.   He was discharged in stable condition and instructed to follow up with primary care in 1 week.  Patient presents today with his family reporting that since being discharged yesterday he has become increasingly short of breath, especially with any activity.  Patient Active Problem List   Diagnosis Date Noted  . Asthma exacerbation 03/11/2017  . Vertigo 06/01/2016  . Dizziness 05/23/2016  . Renal artery stenosis ([place]) 12/29/2015  . Aneurysm of thoracic aorta ([place]) 12/29/2015  . 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 Hydrocele, bilateral 09/09/2014  .  Hydrocele sac 08/22/2014  . History of prostate cancer 08/22/2014  . ASCVD (arteriosclerotic cardiovascular disease) 08/06/2014  . Cardiomyopathy, ischemic 08/06/2014  . Prostate cancer ([place]) 08/06/2014  . Obesity 08/06/2014  . Insomnia 08/06/2014  . Neuropathy 08/06/2014  . Abdominal aortic aneurysm ([place]) 08/06/2014  . COPD (chronic obstructive pulmonary disease) ([place]) 08/06/2014  . Hyperglycemia 08/06/2014  . Acid reflux 08/05/2014  . Age-related macular degeneration 08/05/2014  . Branch retinal vein occlusion 08/05/2014  . 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  Hypercholesteremia 08/05/2014  . Pseudoaphakia 08/05/2014  . Secondary glaucoma 08/05/2014  . Osteoarthritis 08/05/2014  . Carotid stenosis 04/02/2014  . PAD (peripheral artery disease) ([place]) 04/02/2014  . 3-vessel CAD 11/06/2013  . Colonic stricture ([place]) 02/21/2013  . BP (high blood pressure) 11/22/2012  . Kidney failure 11/21/2012  . BK (bullous keratopathy) 06/28/2012    Past Medical History:  Diagnosis Date  . Abdominal pain    INTERMITTENT  . ARMD (age related macular degeneration)   . Arthritis    hands  . Asthma   . Atherosclerosis of native arteries of the extremities, unspecified   . CAD (coronary artery disease)    3 vessel/ DR FATH CARDIOLOGIST GAVE CLEARANCE  . Colitis    HX OF  . Constipation   . COPD (chronic obstructive pulmonary disease) ([place])   . GERD (gastroesophageal reflux disease)   . Glaucoma    both eyes/ PATIENT CAN NOT SEE  . HOH (hard of hearing)   . Hyperlipidemia   . Hypertension   . Neuromuscular disorder (HCC)    numbness in feet  . Occlusion and stenosis of carotid artery without mention of cerebral infarction   . Prostate cancer ([place])   . Prostate pain   . Shortness of breath dyspnea   . TIA (transient ischemic attack)    hx of/ 10 yrs ago  . Vertigo   . Wears dentures    upper and lower    Social History   Socioeconomic History  . Marital status: Widowed    Spouse name: Not on file  . Number of children: 4  . Years of education: Not on file  . Highest education level: Not on file  Social Needs  . Financial resource strain: Not on file  . Food insecurity - worry: Not on file  . Food insecurity - inability: Not on file  . Transportation needs - medical: Not on file  . Transportation needs - non-medical: Not on file  Occupational History  . Occupation: retired  Tobacco Use  . Smoking status: Former Smoker    Packs/day: 1.00    Years: 30.00    Pack years: 30.00    Types: Cigarettes    Last attempt to quit: 02/16/2003     Years since quitting: 14.0  . Smokeless tobacco: Never Used  . Tobacco comment: is exposed to second hand smoke  Substance and Sexual Activity  . Alcohol use: No  . Drug use: No  . Sexual activity: Not Currently  Other Topics Concern  . Not on file  Social History Narrative  . Not on file    Outpatient Encounter Medications as of 03/14/2017  Medication Sig  . albuterol (PROVENTIL HFA;VENTOLIN HFA) 108 (90 Base) MCG/ACT inhaler Inhale 2 puffs into the lungs every 6 (six) hours as needed for wheezing or shortness of breath.  [name] amLODipine (NORVASC) 5 MG tablet Take 1 tablet (5 mg total) by mouth daily.  [name] aspirin 325 MG EC tablet Take 325 mg by mouth  daily.  . azithromycin (ZITHROMAX) 250 MG tablet Take 1 tablet p.o. once daily starting from tomorrow 03/14/2017  . brimonidine (ALPHAGAN) 0.2 % ophthalmic solution Place 1 drop into both eyes 2 (two) times daily.   [name] esomeprazole (NEXIUM) 20 MG capsule TAKE 1 CAPSULE TWICE A DAY  . Fluticasone-Salmeterol (ADVAIR DISKUS) 100-50 MCG/DOSE AEPB Inhale 1 puff into the lungs 2 (two) times daily.  [name] latanoprost (XALATAN) 0.005 % ophthalmic solution Place 1 drop into both eyes at bedtime.   . Polyethylene Glycol 3350 (MIRALAX PO) Take by mouth as needed.  . prednisoLONE acetate (PRED FORTE) 1 % ophthalmic suspension Place 1 drop into the left eye daily.   . predniSONE (STERAPRED UNI-PAK 21 TAB) 10 MG (21) TBPK tablet Take 1 tablet (10 mg total) by mouth daily. Take 6 tablets by mouth for 1 day followed by  5 tablets by mouth for 1 day followed by  4 tablets by mouth for 1 day followed by  3 tablets by mouth for 1 day followed by  2 tablets by mouth for 1 day followed by  1 tablet by mouth for a day and stop  . simvastatin (ZOCOR) 20 MG tablet TAKE 1 TABLET DAILY  . traZODone (DESYREL) 50 MG tablet Take 0.5-1 tablets (25-50 mg total) by mouth at bedtime as needed for sleep.   No facility-administered encounter medications on file as of 03/14/2017.       Allergies  Allergen Reactions  . Sulfa Antibiotics Other (See Comments)    rash  . Penicillins Rash    Has patient had a PCN reaction causing immediate rash, facial/tongue/throat swelling, SOB or lightheadedness with hypotension: No Has patient had a PCN reaction causing severe rash involving mucus membranes or skin necrosis: No Has patient had a PCN reaction that required hospitalization: No Has patient had a PCN reaction occurring within the last 10 years: No If all of the above answers are "NO", then may proceed with Cephalosporin use.     Review of Systems  Constitutional: Positive for malaise/fatigue. Negative for fever.  HENT: Negative.   Eyes: Negative.   Respiratory: Positive for cough, sputum production, shortness of breath and wheezing.   Cardiovascular: Negative for chest pain, palpitations, orthopnea and leg swelling.  Gastrointestinal: Negative.   Genitourinary: Negative.   Skin: Negative.   Neurological: Positive for weakness.  Endo/Heme/Allergies: Negative.   Psychiatric/Behavioral: Negative.     Objective:  BP 134/76 (BP Location: Left Arm, Patient Position: Sitting, Cuff Size: Large)   Pulse 90   SpO2 94%  on 2 lpm, 89 on room air  Physical Exam  Constitutional: He is oriented to person, place, and time and well-developed, well-nourished, and in no distress.  HENT:  Head: Normocephalic and atraumatic.  Right Ear: External ear normal.  Left Ear: External ear normal.  Nose: Nose normal.  Mouth/Throat: Oropharynx is clear and moist.  Eyes: Conjunctivae are normal.  Cardiovascular: Normal rate, regular rhythm and normal heart sounds.  Pulmonary/Chest: Effort normal. No respiratory distress. He has wheezes.  Abdominal: Soft.  Neurological: He is alert and oriented to person, place, and time. GCS score is 15.  Skin: Skin is warm and dry.  Psychiatric: Mood, memory, affect and judgment normal.    Assessment and Plan :   1. Chronic obstructive  pulmonary disease with acute exacerbation (HCC) Pt a little better after nebulizer in office. Rx for nebulizer and prednisone taper and RTC 2 days. Trying to keep pt out of hospital if possible. More than  50% of 25 minute visit spent in counseling pt/family. 2.Mild Hypoxia Does not quite qualify for home O2.  3.CAD He is tolerating present strain.  I have done the exam and reviewed the chart and it is accurate to the best of my knowledge. Development worker, community has been used and  any errors in dictation or transcription are unintentional. Miguel Aschoff M.D. [facility]

## 2017-03-14 NOTE — Telephone Encounter (Signed)
Transition Care Management Follow-Up Telephone Call   Date discharged and where: [facility] on 03/13/17.  How have you been since you were released from the hospital? Yesterday was fine but today has been rough so far. Pt is having a lot of sob. Pt is using inhalers as prescribed but does not feel they are helping much. No other s/s.   Any patient concerns? SOB and energy level is low.  Items Reviewed:   Meds: verified  Allergies: verified  Dietary Changes Reviewed: N/A  Functional Questionnaire:  Independent-I Dependent-D  ADLs:   Dressing- I    Eating- I   Maintaining continence- I   Transferring- I   Transportation- D   Meal Prep- I   Managing Meds- I  Confirmed importance and Date/Time of follow-up visits scheduled: Today at 3:30 PM   Confirmed with patient if condition worsens to call PCP or go to the Emergency Dept. Patient was given office number and encouraged to call back with questions or concerns: YES

## 2017-03-16 ENCOUNTER — Ambulatory Visit (INDEPENDENT_AMBULATORY_CARE_PROVIDER_SITE_OTHER): Payer: Medicare Other | Admitting: Family Medicine

## 2017-03-16 ENCOUNTER — Ambulatory Visit
Admission: RE | Admit: 2017-03-16 | Discharge: 2017-03-16 | Disposition: A | Discharge status: Home / Self Care | Payer: Medicare Other | Source: Ambulatory Visit | Attending: Family Medicine | Admitting: Family Medicine

## 2017-03-16 VITALS — BP 118/76 | HR 67 | Temp 98.3°F | Resp 20 | Wt 223.0 lb

## 2017-03-16 DIAGNOSIS — J441 Chronic obstructive pulmonary disease with (acute) exacerbation: Secondary | ICD-10-CM

## 2017-03-16 DIAGNOSIS — R05 Cough: Secondary | ICD-10-CM | POA: Diagnosis present

## 2017-03-16 DIAGNOSIS — I2510 Atherosclerotic heart disease of native coronary artery without angina pectoris: Secondary | ICD-10-CM | POA: Diagnosis not present

## 2017-03-16 DIAGNOSIS — R531 Weakness: Secondary | ICD-10-CM

## 2017-03-16 DIAGNOSIS — I10 Essential (primary) hypertension: Secondary | ICD-10-CM

## 2017-03-16 DIAGNOSIS — R0609 Other forms of dyspnea: Secondary | ICD-10-CM | POA: Diagnosis present

## 2017-03-16 LAB — CULTURE, BLOOD (ROUTINE X 2)
Culture: NO GROWTH
Culture: NO GROWTH
Specimen Description: ADEQUATE
Specimen Description: ADEQUATE

## 2017-03-16 MED ORDER — IPRATROPIUM-ALBUTEROL 0.5-2.5 (3) MG/3ML IN SOLN: 3.0000 mL | Freq: Once | RESPIRATORY_TRACT | Status: DC

## 2017-03-16 NOTE — Progress Notes (Signed)
Johnathan [name]  MRN: [id number] DOB: 28-Jan-1926  Subjective:  HPI   Last office visit was on 03/14/2017 for Hospital follow up for COPD exacerbation URI. At that time patient was started on Doxy and is still taking this, he is also using inhalers still, using nebulizer  Patient is doing some better. Cough is better but present. No fever. He is still getting short winded easily, weak, fatigue. Patient is not on oxygen. O2 went down from 2 days ago from 94% to 90% today. Wt Readings from Last 3 Encounters:  03/16/17 223 lb (101.2 kg)  03/11/17 240 lb (108.9 kg)  11/09/16 227 lb (103 kg)    Patient Active Problem List   Diagnosis Date Noted  . Asthma exacerbation 03/11/2017  . Vertigo 06/01/2016  . Dizziness 05/23/2016  . Renal artery stenosis ([place]) 12/29/2015  . Aneurysm of thoracic aorta ([place]) 12/29/2015  . 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 Hydrocele, bilateral 09/09/2014  . Hydrocele sac 08/22/2014  . History of prostate cancer 08/22/2014  . ASCVD (arteriosclerotic cardiovascular disease) 08/06/2014  . Cardiomyopathy, ischemic 08/06/2014  . Prostate cancer ([place]) 08/06/2014  . Obesity 08/06/2014  . Insomnia 08/06/2014  . Neuropathy 08/06/2014  . Abdominal aortic aneurysm ([place]) 08/06/2014  . COPD (chronic obstructive pulmonary disease) ([place]) 08/06/2014  . Hyperglycemia 08/06/2014  . Acid reflux 08/05/2014  . Age-related macular degeneration 08/05/2014  . Branch retinal vein occlusion 08/05/2014  . 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 Hypercholesteremia 08/05/2014  . Pseudoaphakia 08/05/2014  . Secondary glaucoma 08/05/2014  . Osteoarthritis 08/05/2014  . Carotid stenosis 04/02/2014  . PAD (peripheral artery disease) ([place]) 04/02/2014  . 3-vessel CAD 11/06/2013  . Colonic stricture ([place]) 02/21/2013  . BP (high blood pressure) 11/22/2012  . Kidney failure 11/21/2012  . BK (bullous keratopathy) 06/28/2012    Past Medical History:  Diagnosis Date  .  Abdominal pain    INTERMITTENT  . ARMD (age related macular degeneration)   . Arthritis    hands  . Asthma   . Atherosclerosis of native arteries of the extremities, unspecified   . CAD (coronary artery disease)    3 vessel/ DR FATH CARDIOLOGIST GAVE CLEARANCE  . Colitis    HX OF  . Constipation   . COPD (chronic obstructive pulmonary disease) ([place])   . GERD (gastroesophageal reflux disease)   . Glaucoma    both eyes/ PATIENT CAN NOT SEE  . HOH (hard of hearing)   . Hyperlipidemia   . Hypertension   . Neuromuscular disorder (HCC)    numbness in feet  . Occlusion and stenosis of carotid artery without mention of cerebral infarction   . Prostate cancer ([place])   . Prostate pain   . Shortness of breath dyspnea   . TIA (transient ischemic attack)    hx of/ 10 yrs ago  . Vertigo   . Wears dentures    upper and lower    Social History   Socioeconomic History  . Marital status: Widowed    Spouse name: Not on file  . Number of children: 4  . Years of education: Not on file  . Highest education level: Not on file  Social Needs  . Financial resource strain: Not on file  . Food insecurity - worry: Not on file  . Food insecurity - inability: Not on file  . Transportation needs - medical: Not on file  . Transportation needs - non-medical: Not on file  Occupational History  . Occupation: retired  Tobacco  Use  . Smoking status: Former Smoker    Packs/day: 1.00    Years: 30.00    Pack years: 30.00    Types: Cigarettes    Last attempt to quit: 02/16/2003    Years since quitting: 14.0  . Smokeless tobacco: Never Used  . Tobacco comment: is exposed to second hand smoke  Substance and Sexual Activity  . Alcohol use: No  . Drug use: No  . Sexual activity: Not Currently  Other Topics Concern  . Not on file  Social History Narrative  . Not on file    Outpatient Encounter Medications as of 03/16/2017  Medication Sig  . albuterol (PROVENTIL HFA;VENTOLIN HFA) 108 (90 Base)  MCG/ACT inhaler Inhale 2 puffs into the lungs every 6 (six) hours as needed for wheezing or shortness of breath.  [name] amLODipine (NORVASC) 5 MG tablet Take 1 tablet (5 mg total) by mouth daily.  [name] aspirin 325 MG EC tablet Take 325 mg by mouth daily.  [name] azithromycin (ZITHROMAX) 250 MG tablet Take 1 tablet p.o. once daily starting from tomorrow 03/14/2017  . brimonidine (ALPHAGAN) 0.2 % ophthalmic solution Place 1 drop into both eyes 2 (two) times daily.   [name] doxycycline (VIBRA-TABS) 100 MG tablet Take 1 tablet (100 mg total) by mouth 2 (two) times daily.  [name] esomeprazole (NEXIUM) 20 MG capsule TAKE 1 CAPSULE TWICE A DAY  . Fluticasone-Salmeterol (ADVAIR DISKUS) 100-50 MCG/DOSE AEPB Inhale 1 puff into the lungs 2 (two) times daily.  [name] latanoprost (XALATAN) 0.005 % ophthalmic solution Place 1 drop into both eyes at bedtime.   . Polyethylene Glycol 3350 (MIRALAX PO) Take by mouth as needed.  . prednisoLONE acetate (PRED FORTE) 1 % ophthalmic suspension Place 1 drop into the left eye daily.   . predniSONE (STERAPRED UNI-PAK 21 TAB) 10 MG (21) TBPK tablet Take 1 tablet (10 mg total) by mouth daily. Take 6 tablets by mouth for 1 day followed by  5 tablets by mouth for 1 day followed by  4 tablets by mouth for 1 day followed by  3 tablets by mouth for 1 day followed by  2 tablets by mouth for 1 day followed by  1 tablet by mouth for a day and stop  . simvastatin (ZOCOR) 20 MG tablet TAKE 1 TABLET DAILY  . traZODone (DESYREL) 50 MG tablet Take 0.5-1 tablets (25-50 mg total) by mouth at bedtime as needed for sleep.   No facility-administered encounter medications on file as of 03/16/2017.     Allergies  Allergen Reactions  . Sulfa Antibiotics Other (See Comments)    rash  . Penicillins Rash    Has patient had a PCN reaction causing immediate rash, facial/tongue/throat swelling, SOB or lightheadedness with hypotension: No Has patient had a PCN reaction causing severe rash involving mucus membranes or  skin necrosis: No Has patient had a PCN reaction that required hospitalization: No Has patient had a PCN reaction occurring within the last 10 years: No If all of the above answers are "NO", then may proceed with Cephalosporin use.     Review of Systems  Constitutional: Positive for malaise/fatigue. Negative for fever.  Eyes: Negative.   Respiratory: Positive for cough and sputum production.   Cardiovascular: Negative.   Gastrointestinal: Negative.   Genitourinary: Negative.   Musculoskeletal: Negative.   Skin: Negative.   Neurological: Positive for weakness.       Unsteady gait  Endo/Heme/Allergies: Negative.   Psychiatric/Behavioral: The patient has insomnia (sleeping better).  Objective:  BP 118/76   Pulse 67   Temp 98.3 F (36.8 C)   Resp 20   Wt 223 lb (101.2 kg)   SpO2 90% Comment: resting on room air  BMI 33.91 kg/m   Physical Exam  Constitutional: He is oriented to person, place, and time and well-developed, well-nourished, and in no distress.  Looks better today  HENT:  Head: Normocephalic and atraumatic.  Right Ear: External ear normal.  Left Ear: External ear normal.  Nose: Nose normal.  Eyes: Conjunctivae are normal. Pupils are equal, round, and reactive to light.  Neck: No thyromegaly present.  Cardiovascular: Normal rate, regular rhythm, normal heart sounds and intact distal pulses. Exam reveals no gallop.  No murmur heard. Pulmonary/Chest: Effort normal and breath sounds normal.  Diffuse expiratory wheezes and some rhonchi  Abdominal: Soft.  Neurological: He is alert and oriented to person, place, and time. Gait abnormal. GCS score is 15.  Using wheelchair to ambulate.  Skin: Skin is warm and dry.  Psychiatric: Mood, memory, affect and judgment normal.    Assessment and Plan :  1. Chronic obstructive pulmonary disease with acute exacerbation (HCC) Slightly better. Duoneb nebulizer treatment done in the office-with not much improvement and O2 is  still at 90%. Will refer to pulmonologist and patient [facility] to [facility]. Repeat Chest xray today. Pending results. Continue nebulizer every 4 hours . - Ambulatory referral to Pulmonology - DG Chest 2 View; Future - ipratropium-albuterol (DUONEB) 0.5-2.5 (3) MG/3ML nebulizer solution 3 mL  2. Essential hypertension Stable.  3. ASCVD (arteriosclerotic cardiovascular disease) - Ambulatory referral to Pulmonology - DG Chest 2 View; Future  4. Weakness generalized - Ambulatory referral to Pulmonology - DG Chest 2 View; Future  HPI, Exam and A&P transcribed by Tiffany Kocher, RMA under direction and in the presence of Miguel Aschoff, MD. I have done the exam and reviewed the chart and it is accurate to the best of my knowledge. Development worker, community has been used and  any errors in dictation or transcription are unintentional. Miguel Aschoff M.D. [facility]

## 2017-03-17 ENCOUNTER — Encounter: Payer: Self-pay | Admitting: Internal Medicine

## 2017-03-17 ENCOUNTER — Telehealth: Payer: Self-pay | Admitting: Internal Medicine

## 2017-03-17 ENCOUNTER — Ambulatory Visit (INDEPENDENT_AMBULATORY_CARE_PROVIDER_SITE_OTHER): Payer: Medicare Other | Admitting: Internal Medicine

## 2017-03-17 VITALS — BP 170/80 | HR 70 | Resp 16 | Ht 68.0 in | Wt 238.0 lb

## 2017-03-17 DIAGNOSIS — J449 Chronic obstructive pulmonary disease, unspecified: Secondary | ICD-10-CM

## 2017-03-17 MED ORDER — AMBULATORY NON FORMULARY MEDICATION: 0 refills | Status: AC

## 2017-03-17 MED ORDER — TIOTROPIUM BROMIDE MONOHYDRATE 18 MCG IN CAPS: 18.0000 ug | ORAL_CAPSULE | Freq: Every day | RESPIRATORY_TRACT | 12 refills | Status: DC

## 2017-03-17 MED ORDER — TIOTROPIUM BROMIDE MONOHYDRATE 2.5 MCG/ACT IN AERS: 2.0000 | INHALATION_SPRAY | Freq: Every day | RESPIRATORY_TRACT | 5 refills | Status: DC

## 2017-03-17 NOTE — Telephone Encounter (Signed)
Please call regarding Spiriva handi haler. States insurance will cover Spiriva Restimat. Please call to discuss. Fax [phone]

## 2017-03-17 NOTE — Telephone Encounter (Signed)
Rx changed to Spiriva Resp 2.5 mg Spoke with pharmacist. Nothing further needed.

## 2017-03-17 NOTE — Progress Notes (Signed)
[facility] Consultation      Assessment and Plan:  84 year old male with progressive dyspnea on exertion, now severe, with severe emphysema and recent hospitalization for COPD exacerbation.  COPD, group D. -Currently had a recent exacerbation with continued reduced functional status, this, gated by severe deconditioning with reduced muscle strength which is likely contributing to his dyspnea. - Continue Advair, nebulizers, albuterol.  Will add Spiriva once daily, flutter valve to be used with nebulizer treatments.  Deconditioning with severe exertional dyspnea. -Decline in functional status over the last 6 months, will refer to pulmonary rehab.  Per insurance requirements he requires a PFT beforehand.   Date: 03/17/2017  MRN# [id number] ANDREWS TENER 12/21/1925   Johnathan [name] is a 84 y.o. old male seen in consultation for chief complaint of:    Chief Complaint  Patient presents with  . Advice Only    referred by Dr. Rosanna Randy for eval:  . COPD    Hosp 1/25-1/27 for COPD exacerbation  . Shortness of Breath    with exertion, wheezing  . Cough    mucus--clear to brown thick    HPI:   The patient is an 85 year old male he was admitted to the hospital on 03/11/17 for COPD exacerbation, discharged on 1/27. Since her got out, he has continued to have dyspnea with talking or walking across the room or house. He lives with his son who is present here today and gives some of the history.  He used to go the [place] and walk around up until 3-4 months ago. He smoked until 14 years ago, smoked about ppd more. He was on no inhalers, he has been put on advair which he uses twice daily, does not rinse mouth, not sure it is helping. He has a rescue inhaler inhaler which he does 2-3 times per day. He does nebulizer every 4 hours which is not helping.  He has a cat at home, sleeps in bed with him.  He has some reflux, he takes nexium which helps.  He does have some sinus  drainage, he does not take anything for it.    Imaging personally reviewed, chest x-ray 03/16/17, hyperinflation consistent with emphysema, increased interstitial markings in both bases likely some degree of interstitial scarring. CT chest 07/01/15; mild emphysema.  **Desat walk on rest on RA sat is 92% and HR 51. Walked 150 feet, sat dropped to 89%, HR 75, severe dyspnea, poor gait.   PMHX:   Past Medical History:  Diagnosis Date  . Abdominal pain    INTERMITTENT  . ARMD (age related macular degeneration)   . Arthritis    hands  . Asthma   . Atherosclerosis of native arteries of the extremities, unspecified   . CAD (coronary artery disease)    3 vessel/ DR FATH CARDIOLOGIST GAVE CLEARANCE  . Colitis    HX OF  . Constipation   . COPD (chronic obstructive pulmonary disease) ([place])   . GERD (gastroesophageal reflux disease)   . Glaucoma    both eyes/ PATIENT CAN NOT SEE  . HOH (hard of hearing)   . Hyperlipidemia   . Hypertension   . Neuromuscular disorder (HCC)    numbness in feet  . Occlusion and stenosis of carotid artery without mention of cerebral infarction   . Prostate cancer ([place])   . Prostate pain   . Shortness of breath dyspnea   . TIA (transient ischemic attack)    hx of/ 10 yrs ago  . Vertigo   .  Wears dentures    upper and lower   Surgical Hx:  Past Surgical History:  Procedure Laterality Date  . aortoiliac bypass   1986  . CAROTID ENDARTERECTOMY Right 2003  . CATARACT EXTRACTION    . COLONOSCOPY  02-06-13   Dr Candace Cruise  . COLONOSCOPY WITH PROPOFOL N/A 07/09/2015   Procedure: COLONOSCOPY WITH PROPOFOL;  Surgeon: Hulen Luster, MD;  Location: [facility];  Service: Gastroenterology;  Laterality: N/A;  . CORONARY ARTERY BYPASS GRAFT  2000   x3  . HERNIA REPAIR  1992  . PROSTATE SURGERY  2001   Family Hx:  Family History  Problem Relation Age of Onset  . Cirrhosis Mother        liver  . Heart attack Father   . Prostate cancer Paternal Uncle   .  Kidney disease Neg Hx   . Kidney cancer Neg Hx   . Bladder Cancer Neg Hx    Social Hx:   Social History   Tobacco Use  . Smoking status: Former Smoker    Packs/day: 1.00    Years: 30.00    Pack years: 30.00    Types: Cigarettes    Last attempt to quit: 02/16/2003    Years since quitting: 14.0  . Smokeless tobacco: Never Used  . Tobacco comment: is exposed to second hand smoke  Substance Use Topics  . Alcohol use: No  . Drug use: No   Medication:    Current Outpatient Medications:  .  albuterol (PROVENTIL HFA;VENTOLIN HFA) 108 (90 Base) MCG/ACT inhaler, Inhale 2 puffs into the lungs every 6 (six) hours as needed for wheezing or shortness of breath., Disp: 1 Inhaler, Rfl: 1 .  amLODipine (NORVASC) 5 MG tablet, Take 1 tablet (5 mg total) by mouth daily., Disp: 90 tablet, Rfl: 3 .  aspirin 325 MG EC tablet, Take 325 mg by mouth daily., Disp: , Rfl:  .  brimonidine (ALPHAGAN) 0.2 % ophthalmic solution, Place 1 drop into both eyes 2 (two) times daily. , Disp: , Rfl:  .  doxycycline (VIBRA-TABS) 100 MG tablet, Take 1 tablet (100 mg total) by mouth 2 (two) times daily., Disp: 20 tablet, Rfl: 0 .  esomeprazole (NEXIUM) 20 MG capsule, TAKE 1 CAPSULE TWICE A DAY, Disp: 180 capsule, Rfl: 2 .  Fluticasone-Salmeterol (ADVAIR DISKUS) 100-50 MCG/DOSE AEPB, Inhale 1 puff into the lungs 2 (two) times daily., Disp: 1 each, Rfl: 1 .  latanoprost (XALATAN) 0.005 % ophthalmic solution, Place 1 drop into both eyes at bedtime. , Disp: , Rfl:  .  Polyethylene Glycol 3350 (MIRALAX PO), Take by mouth as needed., Disp: , Rfl:  .  prednisoLONE acetate (PRED FORTE) 1 % ophthalmic suspension, Place 1 drop into the left eye daily. , Disp: , Rfl:  .  predniSONE (STERAPRED UNI-PAK 21 TAB) 10 MG (21) TBPK tablet, Take 1 tablet (10 mg total) by mouth daily. Take 6 tablets by mouth for 1 day followed by  5 tablets by mouth for 1 day followed by  4 tablets by mouth for 1 day followed by  3 tablets by mouth for 1 day  followed by  2 tablets by mouth for 1 day followed by  1 tablet by mouth for a day and stop, Disp: 21 tablet, Rfl: 0 .  simvastatin (ZOCOR) 20 MG tablet, TAKE 1 TABLET DAILY, Disp: 90 tablet, Rfl: 3 .  traZODone (DESYREL) 50 MG tablet, Take 0.5-1 tablets (25-50 mg total) by mouth at bedtime as needed for sleep., Disp:  90 tablet, Rfl: 1 .  azithromycin (ZITHROMAX) 250 MG tablet, Take 1 tablet p.o. once daily starting from tomorrow 03/14/2017 (Patient not taking: Reported on 03/17/2017), Disp: 4 each, Rfl: 0  Current Facility-Administered Medications:  .  ipratropium-albuterol (DUONEB) 0.5-2.5 (3) MG/3ML nebulizer solution 3 mL, 3 mL, Nebulization, Once, Jerrol Banana., MD   Allergies:  Sulfa antibiotics and Penicillins  Review of Systems: Gen:  Denies  fever, sweats, chills HEENT: Denies blurred vision, double vision. bleeds, sore throat Cvc:  No dizziness, chest pain. Resp:   Denies cough or sputum production, shortness of breath Gi: Denies swallowing difficulty, stomach pain. Gu:  Denies bladder incontinence, burning urine Ext:   No Joint pain, stiffness. Skin: No skin rash,  hives  Endoc:  No polyuria, polydipsia. Psych: No depression, insomnia. Other:  All other systems were reviewed with the patient and were negative other that what is mentioned in the HPI.   Physical Examination:   VS: BP (!) 170/80 (BP Location: Left Arm, Cuff Size: Large)   Pulse 70   Resp 16   Ht 5\' 8"  (1.727 m)   Wt 238 lb (108 kg)   SpO2 92%   BMI 36.19 kg/m   General Appearance: No distress  Neuro:without focal findings,  speech normal,  HEENT: PERRLA, EOM intact.   Pulmonary: normal breath sounds, No wheezing.  CardiovascularNormal S1,S2.  No m/r/g.   Abdomen: Benign, Soft, non-tender. Renal:  No costovertebral tenderness  GU:  No performed at this time. Endoc: No evident thyromegaly, no signs of acromegaly. Skin:   warm, no rashes, no ecchymosis  Extremities: normal, no cyanosis,  clubbing.  Other findings:    LABORATORY PANEL:   CBC Recent Labs  Lab 03/13/17 0417  WBC 7.7  HGB 14.0  HCT 41.8  PLT 152   ------------------------------------------------------------------------------------------------------------------  Chemistries  Recent Labs  Lab 03/13/17 0417  NA 139  K 4.2  CL 106  CO2 23  GLUCOSE 164*  BUN 28*  CREATININE 1.35*  CALCIUM 8.5*   ------------------------------------------------------------------------------------------------------------------  Cardiac Enzymes Recent Labs  Lab 03/11/17 1021  TROPONINI <0.03   ------------------------------------------------------------  RADIOLOGY:  Dg Chest 2 View  Result Date: 03/16/2017 CLINICAL DATA:  Acute exacerbation of COPD.  Cough. EXAM: CHEST  2 VIEW COMPARISON:  Chest x-rays dated 03/11/2017 and 06/01/2016 FINDINGS: The heart size and pulmonary vascularity are normal. CABG. Aortic atherosclerosis. There is slight accentuation of the interstitial markings at the bases but there are no consolidative infiltrates or effusions. No acute bone abnormality. Old posterior left rib fractures. Prominent left pericardial fat pad. IMPRESSION: Chronic accentuation of the interstitial markings at the lung bases without acute abnormality. Electronically Signed   By: Lorriane Shire M.D.   On: 03/16/2017 18:02       Thank  you for the consultation and for allowing [facility] to assist in the care of your patient. Our recommendations are noted above.  Please contact us if we can be of further service.   Marda Stalker, MD.  Board Certified in Internal Medicine, Pulmonary Medicine, [place], and Sleep Medicine.  [facility] Office Number: [phone]  Patricia Pesa, M.D.  Merton Border, M.D  03/17/2017

## 2017-03-17 NOTE — Patient Instructions (Addendum)
Start spiriva once daily.  Start flutter valve to be used after nebulizer.  Will refer to pulmonary rehab.   Keep pets out of bedroom.

## 2017-03-22 ENCOUNTER — Ambulatory Visit: Payer: Medicare Other | Attending: Internal Medicine

## 2017-03-22 DIAGNOSIS — J449 Chronic obstructive pulmonary disease, unspecified: Secondary | ICD-10-CM | POA: Diagnosis present

## 2017-03-22 MED ORDER — ALBUTEROL SULFATE (2.5 MG/3ML) 0.083% IN NEBU
2.5000 mg | INHALATION_SOLUTION | Freq: Once | RESPIRATORY_TRACT | Status: AC
  Administered 2017-03-22: 2.5 mg via RESPIRATORY_TRACT
  Filled 2017-03-22: qty 3

## 2017-04-05 ENCOUNTER — Encounter: Payer: Medicare Other | Attending: Internal Medicine

## 2017-04-05 ENCOUNTER — Other Ambulatory Visit: Payer: Self-pay

## 2017-04-05 VITALS — Ht 68.25 in | Wt 218.8 lb

## 2017-04-05 DIAGNOSIS — J449 Chronic obstructive pulmonary disease, unspecified: Secondary | ICD-10-CM | POA: Diagnosis present

## 2017-04-05 NOTE — Patient Instructions (Signed)
Patient Instructions  Patient Details  Name: Johnathan [name] MRN: [id number] Date of Birth: 02/04/1926 Referring Provider:  Laverle Hobby, *  Below are your personal goals for exercise, nutrition, and risk factors. Our goal is to help you stay on track towards obtaining and maintaining these goals. We will be discussing your progress on these goals with you throughout the program.  Initial Exercise Prescription: Initial Exercise Prescription - 04/05/17 1200      Date of Initial Exercise RX and Referring Provider   Date  04/05/17    Referring Provider  Ashby Dawes      Treadmill   MPH  1    Grade  0    Minutes  15 3/3/3    METs  1.77      NuStep   Level  1    SPM  80    Minutes  15    METs  1.7      Biostep-RELP   Level  1    SPM  50    Minutes  15    METs  2      Prescription Details   Frequency (times per week)  3    Duration  Progress to 45 minutes of aerobic exercise without signs/symptoms of physical distress      Intensity   THRR 40-80% of Max Heartrate  87-116    Ratings of Perceived Exertion  11-13    Perceived Dyspnea  0-4      Progression   Progression  Continue to progress workloads to maintain intensity without signs/symptoms of physical distress.      Resistance Training   Training Prescription  Yes    Weight  3 lb    Reps  10-15       Exercise Goals: Frequency: Be able to perform aerobic exercise two to three times per week in program working toward 2-5 days per week of home exercise.  Intensity: Work with a perceived exertion of 11 (fairly light) - 15 (hard) while following your exercise prescription.  We will make changes to your prescription with you as you progress through the program.   Duration: Be able to do 30 to 45 minutes of continuous aerobic exercise in addition to a 5 minute warm-up and a 5 minute cool-down routine.   Nutrition Goals: Your personal nutrition goals will be established when you do your nutrition analysis with  the dietician.  The following are general nutrition guidelines to follow: Cholesterol < 200mg /day Sodium < 1500mg /day Fiber: Men over 50 yrs - 30 grams per day  Personal Goals: Personal Goals and Risk Factors at Admission - 04/05/17 1127      Core Components/Risk Factors/Patient Goals on Admission    Weight Management  Yes;Weight Loss;Obesity    Intervention  Weight Management: Develop a combined nutrition and exercise program designed to reach desired caloric intake, while maintaining appropriate intake of nutrient and fiber, sodium and fats, and appropriate energy expenditure required for the weight goal.;Weight Management: Provide education and appropriate resources to help participant work on and attain dietary goals.;Weight Management/Obesity: Establish reasonable short term and long term weight goals.;Obesity: Provide education and appropriate resources to help participant work on and attain dietary goals.    Admit Weight  218 lb 12.8 oz (99.2 kg)    Goal Weight: Short Term  210 lb (95.3 kg)    Goal Weight: Long Term  200 lb (90.7 kg)    Expected Outcomes  Short Term: Continue to assess and modify interventions until  short term weight is achieved;Long Term: Adherence to nutrition and physical activity/exercise program aimed toward attainment of established weight goal;Weight Maintenance: Understanding of the daily nutrition guidelines, which includes 25-35% calories from fat, 7% or less cal from saturated fats, less than 200mg  cholesterol, less than 1.5gm of sodium, & 5 or more servings of fruits and vegetables daily;Weight Loss: Understanding of general recommendations for a balanced deficit meal plan, which promotes 1-2 lb weight loss per week and includes a negative energy balance of [phone] kcal/d;Understanding recommendations for meals to include 15-35% energy as protein, 25-35% energy from fat, 35-60% energy from carbohydrates, less than 200mg  of dietary cholesterol, 20-35 gm of total  fiber daily;Understanding of distribution of calorie intake throughout the day with the consumption of 4-5 meals/snacks    Improve shortness of breath with ADL's  Yes    Intervention  Provide education, individualized exercise plan and daily activity instruction to help decrease symptoms of SOB with activities of daily living.    Expected Outcomes  Short Term: Improve cardiorespiratory fitness to achieve a reduction of symptoms when performing ADLs;Long Term: Be able to perform more ADLs without symptoms or delay the onset of symptoms    Heart Failure  Yes    Intervention  Provide a combined exercise and nutrition program that is supplemented with education, support and counseling about heart failure. Directed toward relieving symptoms such as shortness of breath, decreased exercise tolerance, and extremity edema.    Expected Outcomes  Improve functional capacity of life;Short term: Daily weights obtained and reported for increase. Utilizing diuretic protocols set by physician.;Short term: Attendance in program 2-3 days a week with increased exercise capacity. Reported lower sodium intake. Reported increased fruit and vegetable intake. Reports medication compliance.;Long term: Adoption of self-care skills and reduction of barriers for early signs and symptoms recognition and intervention leading to self-care maintenance.    Hypertension  Yes    Intervention  Provide education on lifestyle modifcations including regular physical activity/exercise, weight management, moderate sodium restriction and increased consumption of fresh fruit, vegetables, and low fat dairy, alcohol moderation, and smoking cessation.;Monitor prescription use compliance.    Expected Outcomes  Short Term: Continued assessment and intervention until BP is < 140/50mm HG in hypertensive participants. < 130/20mm HG in hypertensive participants with diabetes, heart failure or chronic kidney disease.;Long Term: Maintenance of blood pressure  at goal levels.    Lipids  Yes takes medication    Intervention  Provide education and support for participant on nutrition & aerobic/resistive exercise along with prescribed medications to achieve LDL 70mg , HDL >40mg .    Expected Outcomes  Short Term: Participant states understanding of desired cholesterol values and is compliant with medications prescribed. Participant is following exercise prescription and nutrition guidelines.;Long Term: Cholesterol controlled with medications as prescribed, with individualized exercise RX and with personalized nutrition plan. Value goals: LDL < 70mg , HDL > 40 mg.       Tobacco Use Initial Evaluation: Social History   Tobacco Use  Smoking Status Former Smoker  . Packs/day: 1.00  . Years: 30.00  . Pack years: 30.00  . Types: Cigarettes  . Last attempt to quit: 02/16/2003  . Years since quitting: 14.1  Smokeless Tobacco Never Used  Tobacco Comment   is exposed to second hand smoke    Exercise Goals and Review: Exercise Goals    Row Name 04/05/17 1252             Exercise Goals   Increase Physical Activity  Yes  Intervention  Provide advice, education, support and counseling about physical activity/exercise needs.;Develop an individualized exercise prescription for aerobic and resistive training based on initial evaluation findings, risk stratification, comorbidities and participant's personal goals.       Expected Outcomes  Short Term: Attend rehab on a regular basis to increase amount of physical activity.;Long Term: Add in home exercise to make exercise part of routine and to increase amount of physical activity.;Long Term: Exercising regularly at least 3-5 days a week.       Increase Strength and Stamina  Yes       Intervention  Provide advice, education, support and counseling about physical activity/exercise needs.;Develop an individualized exercise prescription for aerobic and resistive training based on initial evaluation findings,  risk stratification, comorbidities and participant's personal goals.       Expected Outcomes  Short Term: Increase workloads from initial exercise prescription for resistance, speed, and METs.;Short Term: Perform resistance training exercises routinely during rehab and add in resistance training at home;Long Term: Improve cardiorespiratory fitness, muscular endurance and strength as measured by increased METs and functional capacity (6MWT)       Able to understand and use rate of perceived exertion (RPE) scale  Yes       Intervention  Provide education and explanation on how to use RPE scale       Expected Outcomes  Short Term: Able to use RPE daily in rehab to express subjective intensity level;Long Term:  Able to use RPE to guide intensity level when exercising independently       Able to understand and use Dyspnea scale  Yes       Intervention  Provide education and explanation on how to use Dyspnea scale       Expected Outcomes  Short Term: Able to use Dyspnea scale daily in rehab to express subjective sense of shortness of breath during exertion;Long Term: Able to use Dyspnea scale to guide intensity level when exercising independently       Knowledge and understanding of Target Heart Rate Range (THRR)  Yes       Intervention  Provide education and explanation of THRR including how the numbers were predicted and where they are located for reference       Expected Outcomes  Short Term: Able to state/look up THRR;Long Term: Able to use THRR to govern intensity when exercising independently;Short Term: Able to use daily as guideline for intensity in rehab       Able to check pulse independently  Yes       Intervention  Provide education and demonstration on how to check pulse in carotid and radial arteries.;Review the importance of being able to check your own pulse for safety during independent exercise       Expected Outcomes  Short Term: Able to explain why pulse checking is important during  independent exercise;Long Term: Able to check pulse independently and accurately       Understanding of Exercise Prescription  Yes       Intervention  Provide education, explanation, and written materials on patient's individual exercise prescription       Expected Outcomes  Short Term: Able to explain program exercise prescription;Long Term: Able to explain home exercise prescription to exercise independently          Copy of goals given to participant.

## 2017-04-05 NOTE — Progress Notes (Signed)
Pulmonary Individual Treatment Plan  Patient Details  Name: Johnathan [name] MRN: [id number] Date of Birth: [birth year] Referring Provider:     Pulmonary Rehab from 04/05/2017 in [facility] Cardiac and Pulmonary Rehab  Referring Provider  Ramachandran      Initial Encounter Date:    Pulmonary Rehab from 04/05/2017 in [facility] Cardiac and Pulmonary Rehab  Date  04/05/17  Referring Provider  Ashby Dawes      Visit Diagnosis: COPD with chronic bronchitis and emphysema ([place])  Patient's Home Medications on Admission:  Current Outpatient Medications:  .  albuterol (PROVENTIL HFA;VENTOLIN HFA) 108 (90 Base) MCG/ACT inhaler, Inhale 2 puffs into the lungs every 6 (six) hours as needed for wheezing or shortness of breath., Disp: 1 Inhaler, Rfl: 1 .  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  amLODipine (NORVASC) 5 MG tablet, Take 1 tablet (5 mg total) by mouth daily., Disp: 90 tablet, Rfl: 3 .  aspirin 325 MG EC tablet, Take 325 mg by mouth daily., Disp: , Rfl:  .  azithromycin (ZITHROMAX) 250 MG tablet, Take 1 tablet p.o. once daily starting from tomorrow 03/14/2017 (Patient not taking: Reported on 03/17/2017), Disp: 4 each, Rfl: 0 .  brimonidine (ALPHAGAN) 0.2 % ophthalmic solution, Place 1 drop into both eyes 2 (two) times daily. , Disp: , Rfl:  .  doxycycline (VIBRA-TABS) 100 MG tablet, Take 1 tablet (100 mg total) by mouth 2 (two) times daily., Disp: 20 tablet, Rfl: 0 .  esomeprazole (NEXIUM) 20 MG capsule, TAKE 1 CAPSULE TWICE A DAY, Disp: 180 capsule, Rfl: 2 .  Fluticasone-Salmeterol (ADVAIR DISKUS) 100-50 MCG/DOSE AEPB, Inhale 1 puff into the lungs 2 (two) times daily., Disp: 1 each, Rfl: 1 .  latanoprost (XALATAN) 0.005 % ophthalmic solution, Place 1 drop into both eyes at bedtime. , Disp: , Rfl:  .  Polyethylene Glycol 3350 (MIRALAX PO), Take by mouth as needed., Disp: , Rfl:  .  prednisoLONE acetate (PRED FORTE) 1 % ophthalmic suspension,  Place 1 drop into the left eye daily. , Disp: , Rfl:  .  predniSONE (STERAPRED UNI-PAK 21 TAB) 10 MG (21) TBPK tablet, Take 1 tablet (10 mg total) by mouth daily. Take 6 tablets by mouth for 1 day followed by  5 tablets by mouth for 1 day followed by  4 tablets by mouth for 1 day followed by  3 tablets by mouth for 1 day followed by  2 tablets by mouth for 1 day followed by  1 tablet by mouth for a day and stop, Disp: 21 tablet, Rfl: 0 .  simvastatin (ZOCOR) 20 MG tablet, TAKE 1 TABLET DAILY, Disp: 90 tablet, Rfl: 3 .  Tiotropium Bromide Monohydrate (SPIRIVA RESPIMAT) 2.5 MCG/ACT AERS, Inhale 2 puffs into the lungs daily., Disp: 4 g, Rfl: 5 .  traZODone (DESYREL) 50 MG tablet, Take 0.5-1 tablets (25-50 mg total) by mouth at bedtime as needed for sleep., Disp: 90 tablet, Rfl: 1  Current Facility-Administered Medications:  .  ipratropium-albuterol (DUONEB) 0.5-2.5 (3) MG/3ML nebulizer solution 3 mL, 3 mL, Nebulization, Once, Jerrol Banana., MD  Past Medical History: Past Medical History:  Diagnosis Date  . Abdominal pain    INTERMITTENT  . ARMD (age related macular degeneration)   . Arthritis    hands  . Asthma   . Atherosclerosis of native arteries of the extremities, unspecified   . CAD (coronary artery disease)    3 vessel/ DR FATH CARDIOLOGIST GAVE CLEARANCE  .  Colitis    HX OF  . Constipation   . COPD (chronic obstructive pulmonary disease) ([place])   . GERD (gastroesophageal reflux disease)   . Glaucoma    both eyes/ PATIENT CAN NOT SEE  . HOH (hard of hearing)   . Hyperlipidemia   . Hypertension   . Neuromuscular disorder (HCC)    numbness in feet  . Occlusion and stenosis of carotid artery without mention of cerebral infarction   . Prostate cancer ([place])   . Prostate pain   . Shortness of breath dyspnea   . TIA (transient ischemic attack)    hx of/ 10 yrs ago  . Vertigo   . Wears dentures    upper and lower    Tobacco Use: Social History   Tobacco Use   Smoking Status Former Smoker  . Packs/day: 1.00  . Years: 30.00  . Pack years: 30.00  . Types: Cigarettes  . Last attempt to quit: 02/16/2003  . Years since quitting: 14.1  Smokeless Tobacco Never Used  Tobacco Comment   is exposed to second hand smoke    Labs: Recent Review Flowsheet Data    Labs for ITP Cardiac and Pulmonary Rehab Latest Ref Rng & Units 06/24/2014 07/24/2015 05/24/2016 11/09/2016 03/11/2017   Cholestrol 0 - 200 mg/dL 145 141 128 - -   LDLCALC 0 - 99 mg/dL 84 82 75 - -   HDL >40 mg/dL 37 38(L) 29(L) - -   Trlycerides <150 mg/dL 118 107 118 - -   Hemoglobin A1c <5.7 % of total Hgb 5.5 5.4 5.5 5.5 -   HCO3 20.0 - 28.0 mmol/L - - - - 28.0       Pulmonary Assessment Scores: Pulmonary Assessment Scores    Row Name 04/05/17 1146         ADL UCSD   ADL Phase  Entry     SOB Score total  91     Rest  0     Walk  5     Stairs  5     Bath  4     Dress  5     Shop  5       CAT Score   CAT Score  27       mMRC Score   mMRC Score  2        Pulmonary Function Assessment: Pulmonary Function Assessment - 04/05/17 1049      Pulmonary Function Tests   FVC%  68 % date performed 03/22/17    FEV1%  48 %    FEV1/FVC Ratio  54      Breath   Bilateral Breath Sounds  Clear    Shortness of Breath  Yes;Limiting activity       Exercise Target Goals: Date: 04/05/17  Exercise Program Goal: Individual exercise prescription set using results from initial 6 min walk test and THRR while considering  patient's activity barriers and safety.    Exercise Prescription Goal: Initial exercise prescription builds to 30-45 minutes a day of aerobic activity, 2-3 days per week.  Home exercise guidelines will be given to patient during program as part of exercise prescription that the participant will acknowledge.  Activity Barriers & Risk Stratification:   6 Minute Walk: 6 Minute Walk    Row Name 04/05/17 1253         6 Minute Walk   Distance  630 feet     Walk Time  4  minutes     #  of Rest Breaks  2     MPH  1.78     METS  2.3     RPE  13     Perceived Dyspnea   3     VO2 Peak  1.99     Symptoms  Yes (comment)     Comments  Short of breath      Resting HR  59 bpm     Resting BP  122/66     Resting Oxygen Saturation   95 %     Exercise Oxygen Saturation  during 6 min walk  88 %     Max Ex. HR  115 bpm     Max Ex. BP  136/68     2 Minute Post BP  126/66       Interval HR   1 Minute HR  90     2 Minute HR  95     4 Minute HR  84     5 Minute HR  115     6 Minute HR  113     2 Minute Post HR  85     Interval Heart Rate?  Yes       Interval Oxygen   Interval Oxygen?  Yes     Baseline Oxygen Saturation %  95 %     1 Minute Oxygen Saturation %  90 %     1 Minute Liters of Oxygen  0 L     2 Minute Oxygen Saturation %  95 %     2 Minute Liters of Oxygen  0 L     3 Minute Oxygen Saturation %  88 %     3 Minute Liters of Oxygen  0 L     4 Minute Oxygen Saturation %  96 %     4 Minute Liters of Oxygen  0 L     5 Minute Oxygen Saturation %  90 %     5 Minute Liters of Oxygen  0 L     6 Minute Oxygen Saturation %  91 %     6 Minute Liters of Oxygen  0 L     2 Minute Post Oxygen Saturation %  97 %     2 Minute Post Liters of Oxygen  0 L       Oxygen Initial Assessment: Oxygen Initial Assessment - 04/05/17 1126      Home Oxygen   Home Oxygen Device  None    Sleep Oxygen Prescription  None    Home Exercise Oxygen Prescription  None    Home at Rest Exercise Oxygen Prescription  None      Initial 6 min Walk   Oxygen Used  None      Program Oxygen Prescription   Program Oxygen Prescription  None      Intervention   Short Term Goals  To learn and demonstrate proper pursed lip breathing techniques or other breathing techniques.;To learn and understand importance of monitoring SPO2 with pulse oximeter and demonstrate accurate use of the pulse oximeter.;To learn and demonstrate proper use of respiratory medications;To learn and understand  importance of maintaining oxygen saturations>88%    Long  Term Goals  Verbalizes importance of monitoring SPO2 with pulse oximeter and return demonstration;Maintenance of O2 saturations>88%;Exhibits proper breathing techniques, such as pursed lip breathing or other method taught during program session;Compliance with respiratory medication;Demonstrates proper use of MDI's       Oxygen Re-Evaluation:   Oxygen  Discharge (Final Oxygen Re-Evaluation):   Initial Exercise Prescription: Initial Exercise Prescription - 04/05/17 1200      Date of Initial Exercise RX and Referring Provider   Date  04/05/17    Referring Provider  Ashby Dawes      Treadmill   MPH  1    Grade  0    Minutes  15 3/3/3    METs  1.77      NuStep   Level  1    SPM  80    Minutes  15    METs  1.7      Biostep-RELP   Level  1    SPM  50    Minutes  15    METs  2      Prescription Details   Frequency (times per week)  3    Duration  Progress to 45 minutes of aerobic exercise without signs/symptoms of physical distress      Intensity   THRR 40-80% of Max Heartrate  87-116    Ratings of Perceived Exertion  11-13    Perceived Dyspnea  0-4      Progression   Progression  Continue to progress workloads to maintain intensity without signs/symptoms of physical distress.      Resistance Training   Training Prescription  Yes    Weight  3 lb    Reps  10-15       Perform Capillary Blood Glucose checks as needed.  Exercise Prescription Changes:   Exercise Comments:   Exercise Goals and Review: Exercise Goals    Row Name 04/05/17 1252             Exercise Goals   Increase Physical Activity  Yes       Intervention  Provide advice, education, support and counseling about physical activity/exercise needs.;Develop an individualized exercise prescription for aerobic and resistive training based on initial evaluation findings, risk stratification, comorbidities and participant's personal goals.        Expected Outcomes  Short Term: Attend rehab on a regular basis to increase amount of physical activity.;Long Term: Add in home exercise to make exercise part of routine and to increase amount of physical activity.;Long Term: Exercising regularly at least 3-5 days a week.       Increase Strength and Stamina  Yes       Intervention  Provide advice, education, support and counseling about physical activity/exercise needs.;Develop an individualized exercise prescription for aerobic and resistive training based on initial evaluation findings, risk stratification, comorbidities and participant's personal goals.       Expected Outcomes  Short Term: Increase workloads from initial exercise prescription for resistance, speed, and METs.;Short Term: Perform resistance training exercises routinely during rehab and add in resistance training at home;Long Term: Improve cardiorespiratory fitness, muscular endurance and strength as measured by increased METs and functional capacity (6MWT)       Able to understand and use rate of perceived exertion (RPE) scale  Yes       Intervention  Provide education and explanation on how to use RPE scale       Expected Outcomes  Short Term: Able to use RPE daily in rehab to express subjective intensity level;Long Term:  Able to use RPE to guide intensity level when exercising independently       Able to understand and use Dyspnea scale  Yes       Intervention  Provide education and explanation on how to use Dyspnea scale  Expected Outcomes  Short Term: Able to use Dyspnea scale daily in rehab to express subjective sense of shortness of breath during exertion;Long Term: Able to use Dyspnea scale to guide intensity level when exercising independently       Knowledge and understanding of Target Heart Rate Range (THRR)  Yes       Intervention  Provide education and explanation of THRR including how the numbers were predicted and where they are located for reference       Expected  Outcomes  Short Term: Able to state/look up THRR;Long Term: Able to use THRR to govern intensity when exercising independently;Short Term: Able to use daily as guideline for intensity in rehab       Able to check pulse independently  Yes       Intervention  Provide education and demonstration on how to check pulse in carotid and radial arteries.;Review the importance of being able to check your own pulse for safety during independent exercise       Expected Outcomes  Short Term: Able to explain why pulse checking is important during independent exercise;Long Term: Able to check pulse independently and accurately       Understanding of Exercise Prescription  Yes       Intervention  Provide education, explanation, and written materials on patient's individual exercise prescription       Expected Outcomes  Short Term: Able to explain program exercise prescription;Long Term: Able to explain home exercise prescription to exercise independently          Exercise Goals Re-Evaluation :   Discharge Exercise Prescription (Final Exercise Prescription Changes):   Nutrition:  Target Goals: Understanding of nutrition guidelines, daily intake of sodium '1500mg'$ , cholesterol '200mg'$ , calories 30% from fat and 7% or less from saturated fats, daily to have 5 or more servings of fruits and vegetables.  Biometrics: Pre Biometrics - 04/05/17 1252      Pre Biometrics   Height  5' 8.25" (1.734 m)    Weight  218 lb 12.8 oz (99.2 kg)    Waist Circumference  44.75 inches    Hip Circumference  46 inches    Waist to Hip Ratio  0.97 %    BMI (Calculated)  33.01        Nutrition Therapy Plan and Nutrition Goals: Nutrition Therapy & Goals - 04/05/17 1123      Personal Nutrition Goals   Comments  Lose some weight and have more energy      Intervention Plan   Intervention  Prescribe, educate and counsel regarding individualized specific dietary modifications aiming towards targeted core components such as  weight, hypertension, lipid management, diabetes, heart failure and other comorbidities.;Nutrition handout(s) given to patient.    Expected Outcomes  Short Term Goal: Understand basic principles of dietary content, such as calories, fat, sodium, cholesterol and nutrients.;Long Term Goal: Adherence to prescribed nutrition plan.       Nutrition Assessments: Nutrition Assessments - 04/05/17 1123      MEDFICTS Scores   Pre Score  35       Nutrition Goals Re-Evaluation:   Nutrition Goals Discharge (Final Nutrition Goals Re-Evaluation):   Psychosocial: Target Goals: Acknowledge presence or absence of significant depression and/or stress, maximize coping skills, provide positive support system. Participant is able to verbalize types and ability to use techniques and skills needed for reducing stress and depression.   Initial Review & Psychosocial Screening: Initial Psych Review & Screening - 04/05/17 1122      Initial  Review   Current issues with  Current Sleep Concerns;Current Stress Concerns    Source of Stress Concerns  Chronic Illness    Comments  it is his breathing that makes him stressed and not able to sleep      Family Dynamics   Good Support System?  Yes    Comments  His daughter and her brother lives with him.      Barriers   Psychosocial barriers to participate in program  The patient should benefit from training in stress management and relaxation.      Screening Interventions   Interventions  Program counselor consult;Provide feedback about the scores to participant;Encouraged to exercise;To provide support and resources with identified psychosocial needs    Expected Outcomes  Short Term goal: Utilizing psychosocial counselor, staff and physician to assist with identification of specific Stressors or current issues interfering with healing process. Setting desired goal for each stressor or current issue identified.;Long Term Goal: Stressors or current issues are  controlled or eliminated.;Short Term goal: Identification and review with participant of any Quality of Life or Depression concerns found by scoring the questionnaire.;Long Term goal: The participant improves quality of Life and PHQ9 Scores as seen by post scores and/or verbalization of changes       Quality of Life Scores:  Scores of 19 and below usually indicate a poorer quality of life in these areas.  A difference of  2-3 points is a clinically meaningful difference.  A difference of 2-3 points in the total score of the Quality of Life Index has been associated with significant improvement in overall quality of life, self-image, physical symptoms, and general health in studies assessing change in quality of life.  PHQ-9: Recent Review Flowsheet Data    Depression screen [facility] 2/9 04/05/2017 04/15/2016 01/08/2015 08/07/2014   Decreased Interest 3 0 0 0   Down, Depressed, Hopeless 0 0 0 0   PHQ - 2 Score 3 0 0 0   Altered sleeping 3 - - -   Tired, decreased energy 3 - - -   Change in appetite 0 - - -   Feeling bad or failure about yourself  0 - - -   Trouble concentrating 0 - - -   Moving slowly or fidgety/restless 0 - - -   Suicidal thoughts 0 - - -   PHQ-9 Score 9 - - -   Difficult doing work/chores Somewhat difficult - - -     Interpretation of Total Score  Total Score Depression Severity:  1-4 = Minimal depression, 5-9 = Mild depression, 10-14 = Moderate depression, 15-19 = Moderately severe depression, 20-27 = Severe depression   Psychosocial Evaluation and Intervention:   Psychosocial Re-Evaluation:   Psychosocial Discharge (Final Psychosocial Re-Evaluation):   Education: Education Goals: Education classes will be provided on a weekly basis, covering required topics. Participant will state understanding/return demonstration of topics presented.  Learning Barriers/Preferences: Learning Barriers/Preferences - 04/05/17 1125      Learning Barriers/Preferences   Learning  Barriers  Sight    Learning Preferences  None       Education Topics:  Initial Evaluation Education: - Verbal, written and demonstration of respiratory meds, oximetry and breathing techniques. Instruction on use of nebulizers and MDIs and importance of monitoring MDI activations.   Pulmonary Rehab from 04/05/2017 in [facility] Cardiac and Pulmonary Rehab  Date  04/05/17  Educator  [facility]  Instruction Review Code  1- Verbalizes Understanding      General Nutrition Guidelines/Fats and Fiber: -  Group instruction provided by verbal, written material, models and posters to present the general guidelines for heart healthy nutrition. Gives an explanation and review of dietary fats and fiber.   Controlling Sodium/Reading Food Labels: -Group verbal and written material supporting the discussion of sodium use in heart healthy nutrition. Review and explanation with models, verbal and written materials for utilization of the food label.   Exercise Physiology & General Exercise Guidelines: - Group verbal and written instruction with models to review the exercise physiology of the cardiovascular system and associated critical values. Provides general exercise guidelines with specific guidelines to those with heart or lung disease.    Aerobic Exercise & Resistance Training: - Gives group verbal and written instruction on the various components of exercise. Focuses on aerobic and resistive training programs and the benefits of this training and how to safely progress through these programs.   Flexibility, Balance, Mind/Body Relaxation: Provides group verbal/written instruction on the benefits of flexibility and balance training, including mind/body exercise modes such as yoga, pilates and tai chi.  Demonstration and skill practice provided.   Stress and Anxiety: - Provides group verbal and written instruction about the health risks of elevated stress and causes of high stress.  Discuss the correlation  between heart/lung disease and anxiety and treatment options. Review healthy ways to manage with stress and anxiety.   Depression: - Provides group verbal and written instruction on the correlation between heart/lung disease and depressed mood, treatment options, and the stigmas associated with seeking treatment.   Exercise & Equipment Safety: - Individual verbal instruction and demonstration of equipment use and safety with use of the equipment.   Pulmonary Rehab from 04/05/2017 in [facility] Cardiac and Pulmonary Rehab  Date  04/05/17  Educator  [facility]  Instruction Review Code  1- Verbalizes Understanding      Infection Prevention: - Provides verbal and written material to individual with discussion of infection control including proper hand washing and proper equipment cleaning during exercise session.   Pulmonary Rehab from 04/05/2017 in [facility] Cardiac and Pulmonary Rehab  Date  04/05/17  Educator  [facility]  Instruction Review Code  1- Verbalizes Understanding      Falls Prevention: - Provides verbal and written material to individual with discussion of falls prevention and safety.   Pulmonary Rehab from 04/05/2017 in [facility] Cardiac and Pulmonary Rehab  Date  04/05/17  Educator  [facility]  Instruction Review Code  1- Verbalizes Understanding      Diabetes: - Individual verbal and written instruction to review signs/symptoms of diabetes, desired ranges of glucose level fasting, after meals and with exercise. Advice that pre and post exercise glucose checks will be done for 3 sessions at entry of program.   Chronic Lung Diseases: - Group verbal and written instruction to review updates, respiratory medications, advancements in procedures and treatments. Discuss use of supplemental oxygen including available portable oxygen systems, continuous and intermittent flow rates, concentrators, personal use and safety guidelines. Review proper use of inhaler and spacers. Provide informative websites for  self-education.    Energy Conservation: - Provide group verbal and written instruction for methods to conserve energy, plan and organize activities. Instruct on pacing techniques, use of adaptive equipment and posture/positioning to relieve shortness of breath.   Triggers and Exacerbations: - Group verbal and written instruction to review types of environmental triggers and ways to prevent exacerbations. Discuss weather changes, air quality and the benefits of nasal washing. Review warning signs and symptoms to help prevent infections. Discuss techniques for  effective airway clearance, coughing, and vibrations.   AED/CPR: - Group verbal and written instruction with the use of models to demonstrate the basic use of the AED with the basic ABC's of resuscitation.   Anatomy and Physiology of the Lungs: - Group verbal and written instruction with the use of models to provide basic lung anatomy and physiology related to function, structure and complications of lung disease.   Anatomy & Physiology of the Heart: - Group verbal and written instruction and models provide basic cardiac anatomy and physiology, with the coronary electrical and arterial systems. Review of Valvular disease and Heart Failure   Cardiac Medications: - Group verbal and written instruction to review commonly prescribed medications for heart disease. Reviews the medication, class of the drug, and side effects.   Know Your Numbers and Risk Factors: -Group verbal and written instruction about important numbers in your health.  Discussion of what are risk factors and how they play a role in the disease process.  Review of Cholesterol, Blood Pressure, Diabetes, and BMI and the role they play in your overall health.   Sleep Hygiene: -Provides group verbal and written instruction about how sleep can affect your health.  Define sleep hygiene, discuss sleep cycles and impact of sleep habits. Review good sleep hygiene tips.     Other: -Provides group and verbal instruction on various topics (see comments)    Knowledge Questionnaire Score: Knowledge Questionnaire Score - 04/05/17 1125      Knowledge Questionnaire Score   Pre Score  13/18 reviewed with patient        Core Components/Risk Factors/Patient Goals at Admission: Personal Goals and Risk Factors at Admission - 04/05/17 1127      Core Components/Risk Factors/Patient Goals on Admission    Weight Management  Yes;Weight Loss;Obesity    Intervention  Weight Management: Develop a combined nutrition and exercise program designed to reach desired caloric intake, while maintaining appropriate intake of nutrient and fiber, sodium and fats, and appropriate energy expenditure required for the weight goal.;Weight Management: Provide education and appropriate resources to help participant work on and attain dietary goals.;Weight Management/Obesity: Establish reasonable short term and long term weight goals.;Obesity: Provide education and appropriate resources to help participant work on and attain dietary goals.    Admit Weight  218 lb 12.8 oz (99.2 kg)    Goal Weight: Short Term  210 lb (95.3 kg)    Goal Weight: Long Term  200 lb (90.7 kg)    Expected Outcomes  Short Term: Continue to assess and modify interventions until short term weight is achieved;Long Term: Adherence to nutrition and physical activity/exercise program aimed toward attainment of established weight goal;Weight Maintenance: Understanding of the daily nutrition guidelines, which includes 25-35% calories from fat, 7% or less cal from saturated fats, less than '200mg'$  cholesterol, less than 1.5gm of sodium, & 5 or more servings of fruits and vegetables daily;Weight Loss: Understanding of general recommendations for a balanced deficit meal plan, which promotes 1-2 lb weight loss per week and includes a negative energy balance of [phone] kcal/d;Understanding recommendations for meals to include 15-35%  energy as protein, 25-35% energy from fat, 35-60% energy from carbohydrates, less than '200mg'$  of dietary cholesterol, 20-35 gm of total fiber daily;Understanding of distribution of calorie intake throughout the day with the consumption of 4-5 meals/snacks    Improve shortness of breath with ADL's  Yes    Intervention  Provide education, individualized exercise plan and daily activity instruction to help decrease symptoms of SOB with  activities of daily living.    Expected Outcomes  Short Term: Improve cardiorespiratory fitness to achieve a reduction of symptoms when performing ADLs;Long Term: Be able to perform more ADLs without symptoms or delay the onset of symptoms    Heart Failure  Yes    Intervention  Provide a combined exercise and nutrition program that is supplemented with education, support and counseling about heart failure. Directed toward relieving symptoms such as shortness of breath, decreased exercise tolerance, and extremity edema.    Expected Outcomes  Improve functional capacity of life;Short term: Daily weights obtained and reported for increase. Utilizing diuretic protocols set by physician.;Short term: Attendance in program 2-3 days a week with increased exercise capacity. Reported lower sodium intake. Reported increased fruit and vegetable intake. Reports medication compliance.;Long term: Adoption of self-care skills and reduction of barriers for early signs and symptoms recognition and intervention leading to self-care maintenance.    Hypertension  Yes    Intervention  Provide education on lifestyle modifcations including regular physical activity/exercise, weight management, moderate sodium restriction and increased consumption of fresh fruit, vegetables, and low fat dairy, alcohol moderation, and smoking cessation.;Monitor prescription use compliance.    Expected Outcomes  Short Term: Continued assessment and intervention until BP is < 140/46m HG in hypertensive participants. <  130/858mHG in hypertensive participants with diabetes, heart failure or chronic kidney disease.;Long Term: Maintenance of blood pressure at goal levels.    Lipids  Yes takes medication    Intervention  Provide education and support for participant on nutrition & aerobic/resistive exercise along with prescribed medications to achieve LDL '70mg'$ , HDL >'40mg'$ .    Expected Outcomes  Short Term: Participant states understanding of desired cholesterol values and is compliant with medications prescribed. Participant is following exercise prescription and nutrition guidelines.;Long Term: Cholesterol controlled with medications as prescribed, with individualized exercise RX and with personalized nutrition plan. Value goals: LDL < '70mg'$ , HDL > 40 mg.       Core Components/Risk Factors/Patient Goals Review:    Core Components/Risk Factors/Patient Goals at Discharge (Final Review):    ITP Comments: ITP Comments    Row Name 04/05/17 1048           ITP Comments  Medical Evaluation completed. Chart sent for review and changes to Dr. MaEmily Filbertirector of [facility] [facility]Diagnosis can be found [facility]          Comments: Initial ITP

## 2017-04-15 ENCOUNTER — Encounter: Payer: Medicare Other | Attending: Internal Medicine | Admitting: *Deleted

## 2017-04-15 DIAGNOSIS — J449 Chronic obstructive pulmonary disease, unspecified: Secondary | ICD-10-CM | POA: Diagnosis not present

## 2017-04-15 NOTE — Progress Notes (Signed)
Daily Session Note  Patient Details  Name: Johnathan [name] MRN: [id number] Date of Birth: 10-28-1925 Referring Provider:     Pulmonary Rehab from 04/05/2017 in [facility]iac [facility] Pulmonary Rehab  Referring Provider  Ramachandran      Encounter Date: 04/15/2017  Check In: Session Check In - 04/15/17 1033      Check-In   Location  [facility]-Cardiac & Pulmonary Rehab    Staff Present  Renita Papa, RN Vickki Hearing, BA, ACSM CEP, Exercise Physiologist;Krista Frederico Hamman, RN BSN    Supervising physician immediately available to respond to emergencies  [facility] immediately available ER MD    Physician(s)  Dr. Kerman Passey and Clearnce Hasten    Medication changes reported      No    Fall or balance concerns reported     No    Warm-up and Cool-down  Performed as group-led instruction    Resistance Training Performed  Yes    VAD Patient?  No      Pain Assessment   Currently in Pain?  No/denies          Social History   Tobacco Use  Smoking Status Former Smoker  . Packs/day: 1.00  . Years: 30.00  . Pack years: 30.00  . Types: Cigarettes  . Last attempt to quit: 02/16/2003  . Years since quitting: 14.1  Smokeless Tobacco Never Used  Tobacco Comment   is exposed to second hand smoke    Goals Met:  Proper associated with RPD/PD & O2 Sat Independence with exercise equipment Using PLB without cueing & demonstrates good technique Exercise tolerated well Strength training completed today  Goals Unmet:  Not Applicable  Comments: Pt able to follow exercise prescription today without complaint.  Will continue to monitor for progression.    Dr. Emily Filbert is Medical Director for [place] and [facility].

## 2017-04-18 ENCOUNTER — Encounter: Payer: Medicare Other | Admitting: *Deleted

## 2017-04-18 DIAGNOSIS — J449 Chronic obstructive pulmonary disease, unspecified: Secondary | ICD-10-CM

## 2017-04-18 NOTE — Progress Notes (Signed)
Daily Session Note  Patient Details  Name: Johnathan [name] MRN: [id number] Date of Birth: August 14, 1925 Referring Provider:     Pulmonary Rehab from 04/05/2017 in [facility] Cardiac and Pulmonary Rehab  Referring Provider  Ramachandran      Encounter Date: 04/18/2017  Check In: Session Check In - 04/18/17 1016      Check-In   Location  [facility]-Cardiac & Pulmonary Rehab    Staff Present  Earlean Shawl, BS, ACSM CEP, Exercise Physiologist;Amanda Oletta Darter, BA, ACSM CEP, Exercise Physiologist;Joseph Flavia Shipper    Supervising physician immediately available to respond to emergencies  [facility] immediately available ER MD    Physician(s)  Dr. Burlene Arnt and Jimmye Norman    Medication changes reported      No    Fall or balance concerns reported     No    Warm-up and Cool-down  Performed as group-led instruction    Resistance Training Performed  Yes    VAD Patient?  No      Pain Assessment   Currently in Pain?  No/denies          Social History   Tobacco Use  Smoking Status Former Smoker  . Packs/day: 1.00  . Years: 30.00  . Pack years: 30.00  . Types: Cigarettes  . Last attempt to quit: 02/16/2003  . Years since quitting: 14.1  Smokeless Tobacco Never Used  Tobacco Comment   is exposed to second hand smoke    Goals Met:  Proper associated with RPD/PD & O2 Sat Exercise tolerated well Queuing for purse lip breathing No report of cardiac concerns or symptoms Strength training completed today  Goals Unmet:  Not Applicable  Comments: Pt able to follow exercise prescription today without complaint.  Will continue to monitor for progression.    Dr. Emily Filbert is Medical Director for [place] and [facility].

## 2017-04-20 ENCOUNTER — Other Ambulatory Visit: Payer: Self-pay | Admitting: Family Medicine

## 2017-04-20 DIAGNOSIS — J449 Chronic obstructive pulmonary disease, unspecified: Secondary | ICD-10-CM | POA: Diagnosis not present

## 2017-04-20 NOTE — Progress Notes (Signed)
Daily Session Note  Patient Details  Name: Johnathan [name] MRN: [id number] Date of Birth: 11-20-1925 Referring Provider:     Pulmonary Rehab from 04/05/2017 in [facility] Cardiac and Pulmonary Rehab  Referring Provider  Ramachandran      Encounter Date: 04/20/2017  Check In: Session Check In - 04/20/17 1014      Check-In   Location  [facility]-Cardiac & Pulmonary Rehab    Staff Present  Nada Maclachlan, BA, ACSM CEP, Exercise Physiologist;Cyerra Yim Darrin Nipper, [place], RCEP, CCRP, Exercise Physiologist    Supervising physician immediately available to respond to emergencies  [facility] immediately available ER MD    Physician(s)  Dr. Joni Fears and Jimmye Norman    Medication changes reported      No    Fall or balance concerns reported     No    Warm-up and Cool-down  Performed as group-led instruction    Resistance Training Performed  Yes    VAD Patient?  No      Pain Assessment   Currently in Pain?  No/denies          Social History   Tobacco Use  Smoking Status Former Smoker  . Packs/day: 1.00  . Years: 30.00  . Pack years: 30.00  . Types: Cigarettes  . Last attempt to quit: 02/16/2003  . Years since quitting: 14.1  Smokeless Tobacco Never Used  Tobacco Comment   is exposed to second hand smoke    Goals Met:  Independence with exercise equipment Exercise tolerated well No report of cardiac concerns or symptoms Strength training completed today  Goals Unmet:  Not Applicable  Comments: Pt able to follow exercise prescription today without complaint.  Will continue to monitor for progression.   Dr. Emily Filbert is Medical Director for [place] and [facility].

## 2017-04-22 ENCOUNTER — Encounter: Payer: Medicare Other | Admitting: *Deleted

## 2017-04-22 DIAGNOSIS — J449 Chronic obstructive pulmonary disease, unspecified: Secondary | ICD-10-CM

## 2017-04-22 NOTE — Progress Notes (Signed)
Daily Session Note  Patient Details  Name: Johnathan [name] MRN: [id number] Date of Birth: December 20, 1925 Referring Provider:     Pulmonary Rehab from 04/05/2017 in [facility] Cardiac and Pulmonary Rehab  Referring Provider  Ramachandran      Encounter Date: 04/22/2017  Check In: Session Check In - 04/22/17 1037      Check-In   Location  [facility]-Cardiac & Pulmonary Rehab    Staff Present  Renita Papa, RN Vickki Hearing, BA, ACSM CEP, Exercise Physiologist;Mary Kellie Shropshire, RN, BSN, MA    Supervising physician immediately available to respond to emergencies  [facility] immediately available ER MD    Physician(s)   Dr. Reita Cliche and Archie Balboa    Medication changes reported      No    Fall or balance concerns reported     No    Warm-up and Cool-down  Performed as group-led instruction    Resistance Training Performed  Yes    VAD Patient?  No      Pain Assessment   Currently in Pain?  No/denies          Social History   Tobacco Use  Smoking Status Former Smoker  . Packs/day: 1.00  . Years: 30.00  . Pack years: 30.00  . Types: Cigarettes  . Last attempt to quit: 02/16/2003  . Years since quitting: 14.1  Smokeless Tobacco Never Used  Tobacco Comment   is exposed to second hand smoke    Goals Met:  Proper associated with RPD/PD & O2 Sat Independence with exercise equipment Using PLB without cueing & demonstrates good technique Exercise tolerated well Strength training completed today  Goals Unmet:  Not Applicable  Comments: Pt able to follow exercise prescription today without complaint.  Will continue to monitor for progression.    Dr. Emily Filbert is Medical Director for [place] and [facility].

## 2017-04-25 DIAGNOSIS — J449 Chronic obstructive pulmonary disease, unspecified: Secondary | ICD-10-CM

## 2017-04-25 NOTE — Progress Notes (Signed)
Daily Session Note  Patient Details  Name: Johnathan [name] MRN: [id number] Date of Birth: Mar 13, 1925 Referring Provider:     Pulmonary Rehab from 04/05/2017 in [facility] Cardiac and Pulmonary Rehab  Referring Provider  Ramachandran      Encounter Date: 04/25/2017  Check In: Session Check In - 04/25/17 1009      Check-In   Location  [facility]-Cardiac & Pulmonary Rehab    Staff Present  Nada Maclachlan, BA, ACSM CEP, Exercise Physiologist;Kelly Amedeo Plenty, BS, ACSM CEP, Exercise Physiologist;Orien Mayhall Flavia Shipper    Supervising physician immediately available to respond to emergencies  [facility] immediately available ER MD    Physician(s)  Dr. Corky Downs and Jimmye Norman    Medication changes reported      No    Fall or balance concerns reported     No    Tobacco Cessation  No Change    Warm-up and Cool-down  Performed as group-led instruction    Resistance Training Performed  Yes    VAD Patient?  No      Pain Assessment   Currently in Pain?  No/denies          Social History   Tobacco Use  Smoking Status Former Smoker  . Packs/day: 1.00  . Years: 30.00  . Pack years: 30.00  . Types: Cigarettes  . Last attempt to quit: 02/16/2003  . Years since quitting: 14.1  Smokeless Tobacco Never Used  Tobacco Comment   is exposed to second hand smoke    Goals Met:  Independence with exercise equipment Exercise tolerated well No report of cardiac concerns or symptoms Strength training completed today  Goals Unmet:  Not Applicable  Comments: Pt able to follow exercise prescription today without complaint.  Will continue to monitor for progression.   Dr. Emily Filbert is Medical Director for [place] and [facility].

## 2017-04-25 NOTE — Progress Notes (Signed)
Pulmonary Individual Treatment Plan  Patient Details  Name: Johnathan [name] MRN: [id number] Date of Birth: 1925/09/14 Referring Provider:     Pulmonary Rehab from 04/05/2017 in [facility] Cardiac and Pulmonary Rehab  Referring Provider  Ramachandran      Initial Encounter Date:    Pulmonary Rehab from 04/05/2017 in [facility] Cardiac and Pulmonary Rehab  Date  04/05/17  Referring Provider  Ashby Dawes      Visit Diagnosis: COPD with chronic bronchitis and emphysema ([place])  Patient's Home Medications on Admission:  Current Outpatient Medications:  .  albuterol (PROVENTIL HFA;VENTOLIN HFA) 108 (90 Base) MCG/ACT inhaler, Inhale 2 puffs into the lungs every 6 (six) hours as needed for wheezing or shortness of breath., Disp: 1 Inhaler, Rfl: 1 .  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  amLODipine (NORVASC) 5 MG tablet, Take 1 tablet (5 mg total) by mouth daily., Disp: 90 tablet, Rfl: 3 .  amLODipine (NORVASC) 5 MG tablet, TAKE 1 TABLET DAILY, Disp: 90 tablet, Rfl: 3 .  aspirin 325 MG EC tablet, Take 325 mg by mouth daily., Disp: , Rfl:  .  azithromycin (ZITHROMAX) 250 MG tablet, Take 1 tablet p.o. once daily starting from tomorrow 03/14/2017 (Patient not taking: Reported on 03/17/2017), Disp: 4 each, Rfl: 0 .  brimonidine (ALPHAGAN) 0.2 % ophthalmic solution, Place 1 drop into both eyes 2 (two) times daily. , Disp: , Rfl:  .  doxycycline (VIBRA-TABS) 100 MG tablet, Take 1 tablet (100 mg total) by mouth 2 (two) times daily., Disp: 20 tablet, Rfl: 0 .  esomeprazole (NEXIUM) 20 MG capsule, TAKE 1 CAPSULE TWICE A DAY, Disp: 180 capsule, Rfl: 2 .  Fluticasone-Salmeterol (ADVAIR DISKUS) 100-50 MCG/DOSE AEPB, Inhale 1 puff into the lungs 2 (two) times daily., Disp: 1 each, Rfl: 1 .  latanoprost (XALATAN) 0.005 % ophthalmic solution, Place 1 drop into both eyes at bedtime. , Disp: , Rfl:  .  Polyethylene Glycol 3350 (MIRALAX PO), Take by mouth as needed.,  Disp: , Rfl:  .  prednisoLONE acetate (PRED FORTE) 1 % ophthalmic suspension, Place 1 drop into the left eye daily. , Disp: , Rfl:  .  predniSONE (STERAPRED UNI-PAK 21 TAB) 10 MG (21) TBPK tablet, Take 1 tablet (10 mg total) by mouth daily. Take 6 tablets by mouth for 1 day followed by  5 tablets by mouth for 1 day followed by  4 tablets by mouth for 1 day followed by  3 tablets by mouth for 1 day followed by  2 tablets by mouth for 1 day followed by  1 tablet by mouth for a day and stop, Disp: 21 tablet, Rfl: 0 .  simvastatin (ZOCOR) 20 MG tablet, TAKE 1 TABLET DAILY, Disp: 90 tablet, Rfl: 3 .  Tiotropium Bromide Monohydrate (SPIRIVA RESPIMAT) 2.5 MCG/ACT AERS, Inhale 2 puffs into the lungs daily., Disp: 4 g, Rfl: 5 .  traZODone (DESYREL) 50 MG tablet, Take 0.5-1 tablets (25-50 mg total) by mouth at bedtime as needed for sleep., Disp: 90 tablet, Rfl: 1  Current Facility-Administered Medications:  .  ipratropium-albuterol (DUONEB) 0.5-2.5 (3) MG/3ML nebulizer solution 3 mL, 3 mL, Nebulization, Once, Jerrol Banana., MD  Past Medical History: Past Medical History:  Diagnosis Date  . Abdominal pain    INTERMITTENT  . ARMD (age related macular degeneration)   . Arthritis    hands  . Asthma   . Atherosclerosis of native arteries of the extremities, unspecified   .  CAD (coronary artery disease)    3 vessel/ DR FATH CARDIOLOGIST GAVE CLEARANCE  . Colitis    HX OF  . Constipation   . COPD (chronic obstructive pulmonary disease) ([place])   . GERD (gastroesophageal reflux disease)   . Glaucoma    both eyes/ PATIENT CAN NOT SEE  . HOH (hard of hearing)   . Hyperlipidemia   . Hypertension   . Neuromuscular disorder (HCC)    numbness in feet  . Occlusion and stenosis of carotid artery without mention of cerebral infarction   . Prostate cancer ([place])   . Prostate pain   . Shortness of breath dyspnea   . TIA (transient ischemic attack)    hx of/ 10 yrs ago  . Vertigo   . Wears dentures     upper and lower    Tobacco Use: Social History   Tobacco Use  Smoking Status Former Smoker  . Packs/day: 1.00  . Years: 30.00  . Pack years: 30.00  . Types: Cigarettes  . Last attempt to quit: 02/16/2003  . Years since quitting: 14.1  Smokeless Tobacco Never Used  Tobacco Comment   is exposed to second hand smoke    Labs: Recent Review Flowsheet Data    Labs for ITP Cardiac and Pulmonary Rehab Latest Ref Rng & Units 06/24/2014 07/24/2015 05/24/2016 11/09/2016 03/11/2017   Cholestrol 0 - 200 mg/dL 145 141 128 - -   LDLCALC 0 - 99 mg/dL 84 82 75 - -   HDL >40 mg/dL 37 38(L) 29(L) - -   Trlycerides <150 mg/dL 118 107 118 - -   Hemoglobin A1c <5.7 % of total Hgb 5.5 5.4 5.5 5.5 -   HCO3 20.0 - 28.0 mmol/L - - - - 28.0       Pulmonary Assessment Scores: Pulmonary Assessment Scores    Row Name 04/05/17 1146         ADL UCSD   ADL Phase  Entry     SOB Score total  91     Rest  0     Walk  5     Stairs  5     Bath  4     Dress  5     Shop  5       CAT Score   CAT Score  27       mMRC Score   mMRC Score  2        Pulmonary Function Assessment: Pulmonary Function Assessment - 04/05/17 1049      Pulmonary Function Tests   FVC%  68 % date performed 03/22/17    FEV1%  48 %    FEV1/FVC Ratio  54      Breath   Bilateral Breath Sounds  Clear    Shortness of Breath  Yes;Limiting activity       Exercise Target Goals:    Exercise Program Goal: Individual exercise prescription set using results from initial 6 min walk test and THRR while considering  patient's activity barriers and safety.    Exercise Prescription Goal: Initial exercise prescription builds to 30-45 minutes a day of aerobic activity, 2-3 days per week.  Home exercise guidelines will be given to patient during program as part of exercise prescription that the participant will acknowledge.  Activity Barriers & Risk Stratification:   6 Minute Walk: 6 Minute Walk    Row Name 04/05/17 1253          6 Minute Walk   Distance  630 feet     Walk Time  4 minutes     # of Rest Breaks  2     MPH  1.78     METS  2.3     RPE  13     Perceived Dyspnea   3     VO2 Peak  1.99     Symptoms  Yes (comment)     Comments  Short of breath      Resting HR  59 bpm     Resting BP  122/66     Resting Oxygen Saturation   95 %     Exercise Oxygen Saturation  during 6 min walk  88 %     Max Ex. HR  115 bpm     Max Ex. BP  136/68     2 Minute Post BP  126/66       Interval HR   1 Minute HR  90     2 Minute HR  95     4 Minute HR  84     5 Minute HR  115     6 Minute HR  113     2 Minute Post HR  85     Interval Heart Rate?  Yes       Interval Oxygen   Interval Oxygen?  Yes     Baseline Oxygen Saturation %  95 %     1 Minute Oxygen Saturation %  90 %     1 Minute Liters of Oxygen  0 L     2 Minute Oxygen Saturation %  95 %     2 Minute Liters of Oxygen  0 L     3 Minute Oxygen Saturation %  88 %     3 Minute Liters of Oxygen  0 L     4 Minute Oxygen Saturation %  96 %     4 Minute Liters of Oxygen  0 L     5 Minute Oxygen Saturation %  90 %     5 Minute Liters of Oxygen  0 L     6 Minute Oxygen Saturation %  91 %     6 Minute Liters of Oxygen  0 L     2 Minute Post Oxygen Saturation %  97 %     2 Minute Post Liters of Oxygen  0 L       Oxygen Initial Assessment: Oxygen Initial Assessment - 04/05/17 1126      Home Oxygen   Home Oxygen Device  None    Sleep Oxygen Prescription  None    Home Exercise Oxygen Prescription  None    Home at Rest Exercise Oxygen Prescription  None      Initial 6 min Walk   Oxygen Used  None      Program Oxygen Prescription   Program Oxygen Prescription  None      Intervention   Short Term Goals  To learn and demonstrate proper pursed lip breathing techniques or other breathing techniques.;To learn and understand importance of monitoring SPO2 with pulse oximeter and demonstrate accurate use of the pulse oximeter.;To learn and demonstrate proper  use of respiratory medications;To learn and understand importance of maintaining oxygen saturations>88%    Long  Term Goals  Verbalizes importance of monitoring SPO2 with pulse oximeter and return demonstration;Maintenance of O2 saturations>88%;Exhibits proper breathing techniques, such as pursed lip breathing or other method taught during program session;Compliance with respiratory  medication;Demonstrates proper use of MDI's       Oxygen Re-Evaluation:   Oxygen Discharge (Final Oxygen Re-Evaluation):   Initial Exercise Prescription: Initial Exercise Prescription - 04/05/17 1200      Date of Initial Exercise RX and Referring Provider   Date  04/05/17    Referring Provider  Ashby Dawes      Treadmill   MPH  1    Grade  0    Minutes  15 3/3/3    METs  1.77      NuStep   Level  1    SPM  80    Minutes  15    METs  1.7      Biostep-RELP   Level  1    SPM  50    Minutes  15    METs  2      Prescription Details   Frequency (times per week)  3    Duration  Progress to 45 minutes of aerobic exercise without signs/symptoms of physical distress      Intensity   THRR 40-80% of Max Heartrate  87-116    Ratings of Perceived Exertion  11-13    Perceived Dyspnea  0-4      Progression   Progression  Continue to progress workloads to maintain intensity without signs/symptoms of physical distress.      Resistance Training   Training Prescription  Yes    Weight  3 lb    Reps  10-15       Perform Capillary Blood Glucose checks as needed.  Exercise Prescription Changes:   Exercise Comments:   Exercise Goals and Review: Exercise Goals    Row Name 04/05/17 1252             Exercise Goals   Increase Physical Activity  Yes       Intervention  Provide advice, education, support and counseling about physical activity/exercise needs.;Develop an individualized exercise prescription for aerobic and resistive training based on initial evaluation findings, risk  stratification, comorbidities and participant's personal goals.       Expected Outcomes  Short Term: Attend rehab on a regular basis to increase amount of physical activity.;Long Term: Add in home exercise to make exercise part of routine and to increase amount of physical activity.;Long Term: Exercising regularly at least 3-5 days a week.       Increase Strength and Stamina  Yes       Intervention  Provide advice, education, support and counseling about physical activity/exercise needs.;Develop an individualized exercise prescription for aerobic and resistive training based on initial evaluation findings, risk stratification, comorbidities and participant's personal goals.       Expected Outcomes  Short Term: Increase workloads from initial exercise prescription for resistance, speed, and METs.;Short Term: Perform resistance training exercises routinely during rehab and add in resistance training at home;Long Term: Improve cardiorespiratory fitness, muscular endurance and strength as measured by increased METs and functional capacity (6MWT)       Able to understand and use rate of perceived exertion (RPE) scale  Yes       Intervention  Provide education and explanation on how to use RPE scale       Expected Outcomes  Short Term: Able to use RPE daily in rehab to express subjective intensity level;Long Term:  Able to use RPE to guide intensity level when exercising independently       Able to understand and use Dyspnea scale  Yes  Intervention  Provide education and explanation on how to use Dyspnea scale       Expected Outcomes  Short Term: Able to use Dyspnea scale daily in rehab to express subjective sense of shortness of breath during exertion;Long Term: Able to use Dyspnea scale to guide intensity level when exercising independently       Knowledge and understanding of Target Heart Rate Range (THRR)  Yes       Intervention  Provide education and explanation of THRR including how the numbers  were predicted and where they are located for reference       Expected Outcomes  Short Term: Able to state/look up THRR;Long Term: Able to use THRR to govern intensity when exercising independently;Short Term: Able to use daily as guideline for intensity in rehab       Able to check pulse independently  Yes       Intervention  Provide education and demonstration on how to check pulse in carotid and radial arteries.;Review the importance of being able to check your own pulse for safety during independent exercise       Expected Outcomes  Short Term: Able to explain why pulse checking is important during independent exercise;Long Term: Able to check pulse independently and accurately       Understanding of Exercise Prescription  Yes       Intervention  Provide education, explanation, and written materials on patient's individual exercise prescription       Expected Outcomes  Short Term: Able to explain program exercise prescription;Long Term: Able to explain home exercise prescription to exercise independently          Exercise Goals Re-Evaluation :   Discharge Exercise Prescription (Final Exercise Prescription Changes):   Nutrition:  Target Goals: Understanding of nutrition guidelines, daily intake of sodium <1545m, cholesterol <2092m calories 30% from fat and 7% or less from saturated fats, daily to have 5 or more servings of fruits and vegetables.  Biometrics: Pre Biometrics - 04/05/17 1252      Pre Biometrics   Height  5' 8.25" (1.734 m)    Weight  218 lb 12.8 oz (99.2 kg)    Waist Circumference  44.75 inches    Hip Circumference  46 inches    Waist to Hip Ratio  0.97 %    BMI (Calculated)  33.01        Nutrition Therapy Plan and Nutrition Goals: Nutrition Therapy & Goals - 04/05/17 1123      Personal Nutrition Goals   Comments  Lose some weight and have more energy      Intervention Plan   Intervention  Prescribe, educate and counsel regarding individualized specific  dietary modifications aiming towards targeted core components such as weight, hypertension, lipid management, diabetes, heart failure and other comorbidities.;Nutrition handout(s) given to patient.    Expected Outcomes  Short Term Goal: Understand basic principles of dietary content, such as calories, fat, sodium, cholesterol and nutrients.;Long Term Goal: Adherence to prescribed nutrition plan.       Nutrition Assessments: Nutrition Assessments - 04/05/17 1123      MEDFICTS Scores   Pre Score  35       Nutrition Goals Re-Evaluation:   Nutrition Goals Discharge (Final Nutrition Goals Re-Evaluation):   Psychosocial: Target Goals: Acknowledge presence or absence of significant depression and/or stress, maximize coping skills, provide positive support system. Participant is able to verbalize types and ability to use techniques and skills needed for reducing stress and depression.   Initial  Review & Psychosocial Screening: Initial Psych Review & Screening - 04/05/17 1122      Initial Review   Current issues with  Current Sleep Concerns;Current Stress Concerns    Source of Stress Concerns  Chronic Illness    Comments  it is his breathing that makes him stressed and not able to sleep      Family Dynamics   Good Support System?  Yes    Comments  His daughter and her brother lives with him.      Barriers   Psychosocial barriers to participate in program  The patient should benefit from training in stress management and relaxation.      Screening Interventions   Interventions  Program counselor consult;Provide feedback about the scores to participant;Encouraged to exercise;To provide support and resources with identified psychosocial needs    Expected Outcomes  Short Term goal: Utilizing psychosocial counselor, staff and physician to assist with identification of specific Stressors or current issues interfering with healing process. Setting desired goal for each stressor or current  issue identified.;Long Term Goal: Stressors or current issues are controlled or eliminated.;Short Term goal: Identification and review with participant of any Quality of Life or Depression concerns found by scoring the questionnaire.;Long Term goal: The participant improves quality of Life and PHQ9 Scores as seen by post scores and/or verbalization of changes       Quality of Life Scores:  Scores of 19 and below usually indicate a poorer quality of life in these areas.  A difference of  2-3 points is a clinically meaningful difference.  A difference of 2-3 points in the total score of the Quality of Life Index has been associated with significant improvement in overall quality of life, self-image, physical symptoms, and general health in studies assessing change in quality of life.  PHQ-9: Recent Review Flowsheet Data    Depression screen [facility] 2/9 04/05/2017 04/15/2016 01/08/2015 08/07/2014   Decreased Interest 3 0 0 0   Down, Depressed, Hopeless 0 0 0 0   PHQ - 2 Score 3 0 0 0   Altered sleeping 3 - - -   Tired, decreased energy 3 - - -   Change in appetite 0 - - -   Feeling bad or failure about yourself  0 - - -   Trouble concentrating 0 - - -   Moving slowly or fidgety/restless 0 - - -   Suicidal thoughts 0 - - -   PHQ-9 Score 9 - - -   Difficult doing work/chores Somewhat difficult - - -     Interpretation of Total Score  Total Score Depression Severity:  1-4 = Minimal depression, 5-9 = Mild depression, 10-14 = Moderate depression, 15-19 = Moderately severe depression, 20-27 = Severe depression   Psychosocial Evaluation and Intervention:   Psychosocial Re-Evaluation:   Psychosocial Discharge (Final Psychosocial Re-Evaluation):   Education: Education Goals: Education classes will be provided on a weekly basis, covering required topics. Participant will state understanding/return demonstration of topics presented.  Learning Barriers/Preferences: Learning Barriers/Preferences -  04/05/17 1125      Learning Barriers/Preferences   Learning Barriers  Sight    Learning Preferences  None       Education Topics:  Initial Evaluation Education: - Verbal, written and demonstration of respiratory meds, oximetry and breathing techniques. Instruction on use of nebulizers and MDIs and importance of monitoring MDI activations.   Pulmonary Rehab from 04/20/2017 in [facility] Cardiac and Pulmonary Rehab  Date  04/05/17  Educator  [facility]  Instruction Review Code  1- Verbalizes Understanding      General Nutrition Guidelines/Fats and Fiber: -Group instruction provided by verbal, written material, models and posters to present the general guidelines for heart healthy nutrition. Gives an explanation and review of dietary fats and fiber.   Controlling Sodium/Reading Food Labels: -Group verbal and written material supporting the discussion of sodium use in heart healthy nutrition. Review and explanation with models, verbal and written materials for utilization of the food label.   Exercise Physiology & General Exercise Guidelines: - Group verbal and written instruction with models to review the exercise physiology of the cardiovascular system and associated critical values. Provides general exercise guidelines with specific guidelines to those with heart or lung disease.    Aerobic Exercise & Resistance Training: - Gives group verbal and written instruction on the various components of exercise. Focuses on aerobic and resistive training programs and the benefits of this training and how to safely progress through these programs.   Flexibility, Balance, Mind/Body Relaxation: Provides group verbal/written instruction on the benefits of flexibility and balance training, including mind/body exercise modes such as yoga, pilates and tai chi.  Demonstration and skill practice provided.   Stress and Anxiety: - Provides group verbal and written instruction about the health risks of elevated  stress and causes of high stress.  Discuss the correlation between heart/lung disease and anxiety and treatment options. Review healthy ways to manage with stress and anxiety.   Pulmonary Rehab from 04/20/2017 in [facility] Cardiac and Pulmonary Rehab  Date  04/20/17  Educator  [facility]  Instruction Review Code  1- Verbalizes Understanding      Depression: - Provides group verbal and written instruction on the correlation between heart/lung disease and depressed mood, treatment options, and the stigmas associated with seeking treatment.   Exercise & Equipment Safety: - Individual verbal instruction and demonstration of equipment use and safety with use of the equipment.   Pulmonary Rehab from 04/20/2017 in [facility] Cardiac and Pulmonary Rehab  Date  04/05/17  Educator  [facility]  Instruction Review Code  1- Verbalizes Understanding      Infection Prevention: - Provides verbal and written material to individual with discussion of infection control including proper hand washing and proper equipment cleaning during exercise session.   Pulmonary Rehab from 04/20/2017 in [facility] Cardiac and Pulmonary Rehab  Date  04/05/17  Educator  [facility]  Instruction Review Code  1- Verbalizes Understanding      Falls Prevention: - Provides verbal and written material to individual with discussion of falls prevention and safety.   Pulmonary Rehab from 04/20/2017 in [facility] Cardiac and Pulmonary Rehab  Date  04/05/17  Educator  [facility]  Instruction Review Code  1- Verbalizes Understanding      Diabetes: - Individual verbal and written instruction to review signs/symptoms of diabetes, desired ranges of glucose level fasting, after meals and with exercise. Advice that pre and post exercise glucose checks will be done for 3 sessions at entry of program.   Chronic Lung Diseases: - Group verbal and written instruction to review updates, respiratory medications, advancements in procedures and treatments. Discuss use of supplemental oxygen  including available portable oxygen systems, continuous and intermittent flow rates, concentrators, personal use and safety guidelines. Review proper use of inhaler and spacers. Provide informative websites for self-education.    Energy Conservation: - Provide group verbal and written instruction for methods to conserve energy, plan and organize activities. Instruct on pacing techniques, use of adaptive equipment and posture/positioning to relieve  shortness of breath.   Triggers and Exacerbations: - Group verbal and written instruction to review types of environmental triggers and ways to prevent exacerbations. Discuss weather changes, air quality and the benefits of nasal washing. Review warning signs and symptoms to help prevent infections. Discuss techniques for effective airway clearance, coughing, and vibrations.   AED/CPR: - Group verbal and written instruction with the use of models to demonstrate the basic use of the AED with the basic ABC's of resuscitation.   Pulmonary Rehab from 04/20/2017 in [facility] Cardiac and Pulmonary Rehab  Date  04/15/17  Educator  [facility]  Instruction Review Code  1- Actuary and Physiology of the Lungs: - Group verbal and written instruction with the use of models to provide basic lung anatomy and physiology related to function, structure and complications of lung disease.   Anatomy & Physiology of the Heart: - Group verbal and written instruction and models provide basic cardiac anatomy and physiology, with the coronary electrical and arterial systems. Review of Valvular disease and Heart Failure   Cardiac Medications: - Group verbal and written instruction to review commonly prescribed medications for heart disease. Reviews the medication, class of the drug, and side effects.   Know Your Numbers and Risk Factors: -Group verbal and written instruction about important numbers in your health.  Discussion of what are risk factors  and how they play a role in the disease process.  Review of Cholesterol, Blood Pressure, Diabetes, and BMI and the role they play in your overall health.   Sleep Hygiene: -Provides group verbal and written instruction about how sleep can affect your health.  Define sleep hygiene, discuss sleep cycles and impact of sleep habits. Review good sleep hygiene tips.    Other: -Provides group and verbal instruction on various topics (see comments)    Knowledge Questionnaire Score: Knowledge Questionnaire Score - 04/05/17 1125      Knowledge Questionnaire Score   Pre Score  13/18 reviewed with patient        Core Components/Risk Factors/Patient Goals at Admission: Personal Goals and Risk Factors at Admission - 04/05/17 1127      Core Components/Risk Factors/Patient Goals on Admission    Weight Management  Yes;Weight Loss;Obesity    Intervention  Weight Management: Develop a combined nutrition and exercise program designed to reach desired caloric intake, while maintaining appropriate intake of nutrient and fiber, sodium and fats, and appropriate energy expenditure required for the weight goal.;Weight Management: Provide education and appropriate resources to help participant work on and attain dietary goals.;Weight Management/Obesity: Establish reasonable short term and long term weight goals.;Obesity: Provide education and appropriate resources to help participant work on and attain dietary goals.    Admit Weight  218 lb 12.8 oz (99.2 kg)    Goal Weight: Short Term  210 lb (95.3 kg)    Goal Weight: Long Term  200 lb (90.7 kg)    Expected Outcomes  Short Term: Continue to assess and modify interventions until short term weight is achieved;Long Term: Adherence to nutrition and physical activity/exercise program aimed toward attainment of established weight goal;Weight Maintenance: Understanding of the daily nutrition guidelines, which includes 25-35% calories from fat, 7% or less cal from  saturated fats, less than 220m cholesterol, less than 1.5gm of sodium, & 5 or more servings of fruits and vegetables daily;Weight Loss: Understanding of general recommendations for a balanced deficit meal plan, which promotes 1-2 lb weight loss per week and includes a negative energy  balance of [phone] kcal/d;Understanding recommendations for meals to include 15-35% energy as protein, 25-35% energy from fat, 35-60% energy from carbohydrates, less than 269m of dietary cholesterol, 20-35 gm of total fiber daily;Understanding of distribution of calorie intake throughout the day with the consumption of 4-5 meals/snacks    Improve shortness of breath with ADL's  Yes    Intervention  Provide education, individualized exercise plan and daily activity instruction to help decrease symptoms of SOB with activities of daily living.    Expected Outcomes  Short Term: Improve cardiorespiratory fitness to achieve a reduction of symptoms when performing ADLs;Long Term: Be able to perform more ADLs without symptoms or delay the onset of symptoms    Heart Failure  Yes    Intervention  Provide a combined exercise and nutrition program that is supplemented with education, support and counseling about heart failure. Directed toward relieving symptoms such as shortness of breath, decreased exercise tolerance, and extremity edema.    Expected Outcomes  Improve functional capacity of life;Short term: Daily weights obtained and reported for increase. Utilizing diuretic protocols set by physician.;Short term: Attendance in program 2-3 days a week with increased exercise capacity. Reported lower sodium intake. Reported increased fruit and vegetable intake. Reports medication compliance.;Long term: Adoption of self-care skills and reduction of barriers for early signs and symptoms recognition and intervention leading to self-care maintenance.    Hypertension  Yes    Intervention  Provide education on lifestyle modifcations including  regular physical activity/exercise, weight management, moderate sodium restriction and increased consumption of fresh fruit, vegetables, and low fat dairy, alcohol moderation, and smoking cessation.;Monitor prescription use compliance.    Expected Outcomes  Short Term: Continued assessment and intervention until BP is < 140/976mHG in hypertensive participants. < 130/8010mG in hypertensive participants with diabetes, heart failure or chronic kidney disease.;Long Term: Maintenance of blood pressure at goal levels.    Lipids  Yes takes medication    Intervention  Provide education and support for participant on nutrition & aerobic/resistive exercise along with prescribed medications to achieve LDL <49m37mDL >40mg23m Expected Outcomes  Short Term: Participant states understanding of desired cholesterol values and is compliant with medications prescribed. Participant is following exercise prescription and nutrition guidelines.;Long Term: Cholesterol controlled with medications as prescribed, with individualized exercise RX and with personalized nutrition plan. Value goals: LDL < 49mg,48m > 40 mg.       Core Components/Risk Factors/Patient Goals Review:    Core Components/Risk Factors/Patient Goals at Discharge (Final Review):    ITP Comments: ITP Comments    Row Name 04/05/17 1048 04/25/17 0821         ITP Comments  Medical Evaluation completed. Chart sent for review and changes to Dr. Mark MEmily [facility]ttor of [facility] can be found in [facility] encounter 03/16/17  30 day review completed. ITP sent to Dr. [facility] with ITP unless changes are made by physician.         Comments: 30 day review

## 2017-04-26 ENCOUNTER — Ambulatory Visit (INDEPENDENT_AMBULATORY_CARE_PROVIDER_SITE_OTHER): Payer: Medicare Other | Admitting: Urology

## 2017-04-26 ENCOUNTER — Encounter: Payer: Self-pay | Admitting: Urology

## 2017-04-26 VITALS — BP 149/73 | HR 61 | Resp 16 | Ht 68.0 in | Wt 225.0 lb

## 2017-04-26 DIAGNOSIS — N433 Hydrocele, unspecified: Secondary | ICD-10-CM

## 2017-04-26 DIAGNOSIS — S76211A Strain of adductor muscle, fascia and tendon of right thigh, initial encounter: Secondary | ICD-10-CM | POA: Diagnosis not present

## 2017-04-26 DIAGNOSIS — Z8546 Personal history of malignant neoplasm of prostate: Secondary | ICD-10-CM

## 2017-04-26 LAB — URINALYSIS, COMPLETE
Bilirubin, UA: NEGATIVE
Glucose, UA: NEGATIVE
Ketones, UA: NEGATIVE
Leukocytes, UA: NEGATIVE
Nitrite, UA: NEGATIVE
Protein, UA: NEGATIVE
Specific Gravity, UA: 1.025 (ref 1.005–1.030)
Urobilinogen, Ur: 1 mg/dL (ref 0.2–1.0)
pH, UA: 6 (ref 5.0–7.5)

## 2017-04-26 LAB — BLADDER SCAN AMB NON-IMAGING

## 2017-04-26 NOTE — Progress Notes (Signed)
04/26/2017 10:23 PM   Johnathan [name] [birth year] [id number]  Referring provider: Jerrol Banana., MD [address] [place], [place] [address]  No chief complaint on file.   HPI: Patient is an 84 year old Caucasian male with a history of prostate cancer and bilateral hydroceles who presents today with a complaint of right groin pain.     He states 3 months ago he started to experience right groin pain radiating down his leg and to the top of his foot.  He stated that sitting down made the pain better.  He states standing made the pain worse.  The pain has an intensity of 8/10.  Patient denies any gross hematuria, dysuria or suprapubic/flank pain.  Patient denies any fevers, chills, nausea or vomiting.   He has not noted any trouble with urination or bowel movements.  His urinalysis was negative.  His PVR was 93 mL.  History of prostate cancer Patient underwent a radical prostatectomy in 2000 at [facility]  He does not remember the name of the physician. His most recent PSA was <0.1 ng/mL on 04/26/2017.  His current I PSS is 6/2.  His previous IPSS 6/1.    Score:  1-7 Mild 8-19 Moderate 20-35 Severe  Bilateral hydroceles Patient presented to [place] at the request of his primary care physician, Dr. Rosanna Randy for hydroceles. No changes.    PMH: Past Medical History:  Diagnosis Date  . Abdominal pain    INTERMITTENT  . ARMD (age related macular degeneration)   . Arthritis    hands  . Asthma   . Atherosclerosis of native arteries of the extremities, unspecified   . CAD (coronary artery disease)    3 vessel/ DR FATH CARDIOLOGIST GAVE CLEARANCE  . Colitis    HX OF  . Constipation   . COPD (chronic obstructive pulmonary disease) ([facility])   . GERD (gastroesophageal reflux disease)   . Glaucoma    both eyes/ PATIENT CAN NOT SEE  . HOH (hard of hearing)   . Hyperlipidemia   . Hypertension   . Neuromuscular disorder (HCC)    numbness in feet  . Occlusion and  stenosis of carotid artery without mention of cerebral infarction   . Prostate cancer ([place])   . Prostate pain   . Shortness of breath dyspnea   . TIA (transient ischemic attack)    hx of/ 10 yrs ago  . Vertigo   . Wears dentures    upper and lower    Surgical History: Past Surgical History:  Procedure Laterality Date  . aortoiliac bypass   1986  . CAROTID ENDARTERECTOMY Right 2003  . CATARACT EXTRACTION    . COLONOSCOPY  02-06-13   Dr Candace Cruise  . COLONOSCOPY WITH PROPOFOL N/A 07/09/2015   Procedure: COLONOSCOPY WITH PROPOFOL;  Surgeon: Hulen Luster, MD;  Location: [facility];  Service: Gastroenterology;  Laterality: N/A;  . CORONARY ARTERY BYPASS GRAFT  2000   x3  . HERNIA REPAIR  1992  . PROSTATE SURGERY  2001    Home Medications:  Allergies as of 04/26/2017      Reactions   Sulfa Antibiotics Other (See Comments)   rash   Penicillins Rash   Has patient had a PCN reaction causing immediate rash, facial/tongue/throat swelling, SOB or lightheadedness with hypotension: No Has patient had a PCN reaction causing severe rash involving mucus membranes or skin necrosis: No Has patient had a PCN reaction that required hospitalization: No Has patient had a  PCN reaction occurring within the last 10 years: No If all of the above answers are "NO", then may proceed with Cephalosporin use.      Medication List        Accurate as of 04/26/17 11:59 PM. Always use your most recent med list.          albuterol 108 (90 Base) MCG/ACT inhaler Commonly known as:  PROVENTIL HFA;VENTOLIN HFA Inhale 2 puffs into the lungs every 6 (six) hours as needed for wheezing or shortness of breath.   AMBULATORY NON FORMULARY MEDICATION Medication Name: Flutter valve DX:J44.9   amLODipine 5 MG tablet Commonly known as:  NORVASC Take 1 tablet (5 mg total) by mouth daily.   amLODipine 5 MG tablet Commonly known as:  NORVASC TAKE 1 TABLET DAILY   aspirin 325 MG EC tablet Take 325 mg by mouth  daily.   azithromycin 250 MG tablet Commonly known as:  ZITHROMAX Take 1 tablet p.o. once daily starting from tomorrow 03/14/2017   brimonidine 0.2 % ophthalmic solution Commonly known as:  ALPHAGAN Place 1 drop into both eyes 2 (two) times daily.   doxycycline 100 MG tablet Commonly known as:  VIBRA-TABS Take 1 tablet (100 mg total) by mouth 2 (two) times daily.   esomeprazole 20 MG capsule Commonly known as:  NEXIUM TAKE 1 CAPSULE TWICE A DAY   Fluticasone-Salmeterol 100-50 MCG/DOSE Aepb Commonly known as:  ADVAIR DISKUS Inhale 1 puff into the lungs 2 (two) times daily.   latanoprost 0.005 % ophthalmic solution Commonly known as:  XALATAN Place 1 drop into both eyes at bedtime.   MIRALAX PO Take by mouth as needed.   prednisoLONE acetate 1 % ophthalmic suspension Commonly known as:  PRED FORTE Place 1 drop into the left eye daily.   predniSONE 10 MG (21) Tbpk tablet Commonly known as:  STERAPRED UNI-PAK 21 TAB Take 1 tablet (10 mg total) by mouth daily. Take 6 tablets by mouth for 1 day followed by  5 tablets by mouth for 1 day followed by  4 tablets by mouth for 1 day followed by  3 tablets by mouth for 1 day followed by  2 tablets by mouth for 1 day followed by  1 tablet by mouth for a day and stop   simvastatin 20 MG tablet Commonly known as:  ZOCOR TAKE 1 TABLET DAILY   Tiotropium Bromide Monohydrate 2.5 MCG/ACT Aers Commonly known as:  SPIRIVA RESPIMAT Inhale 2 puffs into the lungs daily.   traZODone 50 MG tablet Commonly known as:  DESYREL Take 0.5-1 tablets (25-50 mg total) by mouth at bedtime as needed for sleep.       Allergies:  Allergies  Allergen Reactions  . Sulfa Antibiotics Other (See Comments)    rash  . Penicillins Rash    Has patient had a PCN reaction causing immediate rash, facial/tongue/throat swelling, SOB or lightheadedness with hypotension: No Has patient had a PCN reaction causing severe rash involving mucus membranes or skin  necrosis: No Has patient had a PCN reaction that required hospitalization: No Has patient had a PCN reaction occurring within the last 10 years: No If all of the above answers are "NO", then may proceed with Cephalosporin use.     Family History: Family History  Problem Relation Age of Onset  . Cirrhosis Mother        liver  . Heart attack Father   . Prostate cancer Paternal Uncle   . Kidney disease Neg Hx   .  Kidney cancer Neg Hx   . Bladder Cancer Neg Hx     Social History:  reports that he quit smoking about 14 years ago. His smoking use included cigarettes. He has a 30.00 pack-year smoking history. He has never used smokeless tobacco. He reports that he does not drink alcohol or use drugs.  ROS: UROLOGY Frequent Urination?: No Hard to postpone urination?: No Burning/pain with urination?: No Get up at night to urinate?: No Leakage of urine?: Yes Urine stream starts and stops?: No Trouble starting stream?: No Do you have to strain to urinate?: No Blood in urine?: No Urinary tract infection?: No Sexually transmitted disease?: No Injury to kidneys or bladder?: No Painful intercourse?: No Weak stream?: No Erection problems?: No Penile pain?: No  Gastrointestinal Nausea?: No Vomiting?: No Indigestion/heartburn?: No Diarrhea?: No Constipation?: No  Constitutional Fever: No Night sweats?: No Weight loss?: No Fatigue?: Yes  Skin Skin rash/lesions?: No Itching?: No  Eyes Blurred vision?: No Double vision?: No  Ears/Nose/Throat Sore throat?: No Sinus problems?: No  Hematologic/Lymphatic Swollen glands?: No Easy bruising?: No  Cardiovascular Leg swelling?: No Chest pain?: No  Respiratory Cough?: No Shortness of breath?: No  Endocrine Excessive thirst?: No  Musculoskeletal Back pain?: No Joint pain?: No  Neurological Headaches?: No Dizziness?: No  Psychologic Depression?: No Anxiety?: No  Physical Exam: BP (!) 149/73   Pulse 61    Resp 16   Ht 5\' 8"  (1.727 m)   Wt 225 lb (102.1 kg)   SpO2 98%   BMI 34.21 kg/m   Constitutional: Well nourished. Alert and oriented, No acute distress. HEENT: [place] AT, moist mucus membranes. Trachea midline, no masses. Cardiovascular: No clubbing, cyanosis, or edema. Respiratory: Normal respiratory effort, no increased work of breathing. GI: Abdomen is soft, non tender, non distended, no abdominal masses. Liver and spleen not palpable.  No hernias appreciated.  Stool sample for occult testing is not indicated.   GU: No CVA tenderness.  No bladder fullness or masses.  Patient with uncircumcised phallus.  Foreskin easily retracted  Urethral meatus is patent.  No penile discharge. No penile lesions or rashes. Scrotum without lesions, cysts, rashes and/or edema.  Bilateral hydroceles.   Rectal: Patient with  normal sphincter tone. Anus and perineum without scarring or rashes. No rectal masses are appreciated. Prostate and seminal vesicals are surgically absent.   Skin: No rashes, bruises or suspicious lesions. Lymph: No cervical or inguinal adenopathy. Neurologic: Grossly intact, no focal deficits, moving all 4 extremities. Psychiatric: Normal mood and affect.   Laboratory Data: PSA <0.1 ng/mL on 04/26/2017 Lab Results  Component Value Date   WBC 5.3 05/14/2017   HGB 14.8 05/14/2017   HCT 44.9 05/14/2017   MCV 93.2 05/14/2017   PLT 220 05/14/2017    Lab Results  Component Value Date   CREATININE 1.43 (H) 05/14/2017      Lab Results  Component Value Date   HGBA1C 5.5 11/09/2016    I have reviewed the labs  Pertinent Imaging: Results for Johnathan, [name] (MRN [id number]) as of 05/15/2017 22:30  Ref. Range 04/26/2017 15:33  Scan Result Unknown 38ml    Assessment & Plan:   1. Groin pain Explained to the patient that his pain is most likely muscle skeletal either radiating from his lower spine or right hip and to follow-up with his PCP for further evaluation  2. Bilateral  hydroceles: Patient was found to have bilateral hydroceles on scrotal ultrasound. No changes.  3. History of PCa: Patient underwent  a radical prostatectomy in 2000.  IPSS score is 6/2.   His most recent PSA was <0.1 ng/mL on 04/26/2017.       Return for keep appointment in July.  Zara Council, [facility] [address] [place], [place] [address] [phone]

## 2017-04-27 ENCOUNTER — Other Ambulatory Visit: Payer: Self-pay | Admitting: Family Medicine

## 2017-04-27 ENCOUNTER — Telehealth: Payer: Self-pay

## 2017-04-27 ENCOUNTER — Encounter: Payer: Medicare Other | Admitting: *Deleted

## 2017-04-27 DIAGNOSIS — J449 Chronic obstructive pulmonary disease, unspecified: Secondary | ICD-10-CM

## 2017-04-27 LAB — PSA: Prostate Specific Ag, Serum: <0.1 ng/mL (ref 0.0–4.0)

## 2017-04-27 NOTE — Progress Notes (Addendum)
[facility] Consultation      Assessment and Plan:  84 year old male with progressive dyspnea on exertion, now severe, with severe emphysema and recent hospitalization for COPD exacerbation.  COPD, group [facility] admission for COPD January 2019, continued reduced functional status, this, complicated by severe deconditioning with reduced muscle strength which is likely contributing to his dyspnea. - Continue Advair, nebulizers, albuterol, spiriva respimat.   --Keep pets out of bedroom. Rinse mouth after advair, call for refills when medications run out.   Deconditioning with severe exertional dyspnea. -Decline in functional status over the last 6 months, will refer to pulmonary rehab.  Per insurance requirements he requires a PFT beforehand.  Meds ordered this encounter  Medications  . Tiotropium Bromide Monohydrate (SPIRIVA RESPIMAT) 2.5 MCG/ACT AERS    Sig: Inhale 2 puffs into the lungs daily.    Dispense:  4 g    Refill:  5  . Fluticasone-Salmeterol (ADVAIR DISKUS) 100-50 MCG/DOSE AEPB    Sig: Inhale 1 puff into the lungs 2 (two) times daily.    Dispense:  1 each    Refill:  1   Return in about 6 months (around 10/29/2017).    Date: 04/27/2017  MRN# [id number] Johnathan [name] 11/27/1925   Johnathan [name] is a 84 y.o. old male seen in consultation for chief complaint of:    Chief Complaint  Patient presents with  . COPD    Pt here for f/u with results of PFT:  . Shortness of Breath    pt c/o sob with exertion; he is not on any inhaler at this time as he had no refills.    HPI:   The patient is an 84 year old male with a history of COPD with exacerbation.  At last visit he was asked to continue Advair, nebulizers, flutter valve.  We added Spiriva, and asked to use his flutter valve after breathing treatments. He ran out 3 weeks ago and did not call for a reflill.  He notes that he is still winded with minimal activity, he is doing advair twice daily,  not rinsing mouth. He is on spiriva respimat twice daily. He is doing pulm rehab.   He has a cat at home, sleeps in bed with him.  He has some reflux, he takes nexium which helps.  He does have some sinus drainage, he does not take anything for it.    Imaging personally reviewed, chest x-ray 03/16/17, hyperinflation consistent with emphysema, increased interstitial markings in both bases likely some degree of interstitial scarring. CT chest 07/01/15; mild emphysema.  **Full PFT 03/22/17; FVC 68%, FEV1 48% predicted, there is no improvement with bronchodilator, flow volume loop appears obstructed. TLC 79% predicted, RV to TLC ratio is normal.  Diffusion capacity is 51%. -Overall this test is consistent with severe obstructive lung disease.  **Desat walk on rest on RA sat is 92% and HR 51. Walked 150 feet, sat dropped to 89%, HR 75, severe dyspnea, poor gait.  Social Hx:   Social History   Tobacco Use  . Smoking status: Former Smoker    Packs/day: 1.00    Years: 30.00    Pack years: 30.00    Types: Cigarettes    Last attempt to quit: 02/16/2003    Years since quitting: 14.2  . Smokeless tobacco: Never Used  . Tobacco comment: is exposed to second hand smoke  Substance Use Topics  . Alcohol use: No  . Drug use: No   Medication:  Current Outpatient Medications:  .  albuterol (PROVENTIL HFA;VENTOLIN HFA) 108 (90 Base) MCG/ACT inhaler, Inhale 2 puffs into the lungs every 6 (six) hours as needed for wheezing or shortness of breath., Disp: 1 Inhaler, Rfl: 1 .  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  amLODipine (NORVASC) 5 MG tablet, Take 1 tablet (5 mg total) by mouth daily., Disp: 90 tablet, Rfl: 3 .  amLODipine (NORVASC) 5 MG tablet, TAKE 1 TABLET DAILY, Disp: 90 tablet, Rfl: 3 .  aspirin 325 MG EC tablet, Take 325 mg by mouth daily., Disp: , Rfl:  .  azithromycin (ZITHROMAX) 250 MG tablet, Take 1 tablet p.o. once daily  starting from tomorrow 03/14/2017 (Patient not taking: Reported on 03/17/2017), Disp: 4 each, Rfl: 0 .  brimonidine (ALPHAGAN) 0.2 % ophthalmic solution, Place 1 drop into both eyes 2 (two) times daily. , Disp: , Rfl:  .  doxycycline (VIBRA-TABS) 100 MG tablet, Take 1 tablet (100 mg total) by mouth 2 (two) times daily., Disp: 20 tablet, Rfl: 0 .  esomeprazole (NEXIUM) 20 MG capsule, TAKE 1 CAPSULE TWICE A DAY, Disp: 180 capsule, Rfl: 2 .  Fluticasone-Salmeterol (ADVAIR DISKUS) 100-50 MCG/DOSE AEPB, Inhale 1 puff into the lungs 2 (two) times daily., Disp: 1 each, Rfl: 1 .  latanoprost (XALATAN) 0.005 % ophthalmic solution, Place 1 drop into both eyes at bedtime. , Disp: , Rfl:  .  Polyethylene Glycol 3350 (MIRALAX PO), Take by mouth as needed., Disp: , Rfl:  .  prednisoLONE acetate (PRED FORTE) 1 % ophthalmic suspension, Place 1 drop into the left eye daily. , Disp: , Rfl:  .  predniSONE (STERAPRED UNI-PAK 21 TAB) 10 MG (21) TBPK tablet, Take 1 tablet (10 mg total) by mouth daily. Take 6 tablets by mouth for 1 day followed by  5 tablets by mouth for 1 day followed by  4 tablets by mouth for 1 day followed by  3 tablets by mouth for 1 day followed by  2 tablets by mouth for 1 day followed by  1 tablet by mouth for a day and stop, Disp: 21 tablet, Rfl: 0 .  simvastatin (ZOCOR) 20 MG tablet, TAKE 1 TABLET DAILY, Disp: 90 tablet, Rfl: 3 .  Tiotropium Bromide Monohydrate (SPIRIVA RESPIMAT) 2.5 MCG/ACT AERS, Inhale 2 puffs into the lungs daily., Disp: 4 g, Rfl: 5 .  traZODone (DESYREL) 50 MG tablet, Take 0.5-1 tablets (25-50 mg total) by mouth at bedtime as needed for sleep., Disp: 90 tablet, Rfl: 1  Current Facility-Administered Medications:  .  ipratropium-albuterol (DUONEB) 0.5-2.5 (3) MG/3ML nebulizer solution 3 mL, 3 mL, Nebulization, Once, Jerrol Banana., MD   Allergies:  Sulfa antibiotics and Penicillins  Review of Systems: Gen:  Denies  fever, sweats, chills HEENT: Denies blurred vision,  double vision. bleeds, sore throat Cvc:  No dizziness, chest pain. Resp:   Denies cough or sputum production, shortness of breath Gi: Denies swallowing difficulty, stomach pain. Gu:  Denies bladder incontinence, burning urine Ext:   No Joint pain, stiffness. Skin: No skin rash,  hives  Endoc:  No polyuria, polydipsia. Psych: No depression, insomnia. Other:  All other systems were reviewed with the patient and were negative other that what is mentioned in the HPI.   Physical Examination:   VS: BP 132/66 (BP Location: Left Arm, Cuff Size: Large)   Pulse 60   Resp 16   Ht 5\' 8"  (1.727 m)   Wt 228 lb (103.4  kg)   SpO2 95%   BMI 34.67 kg/m   General Appearance: No distress  Neuro:without focal findings,  speech normal,  HEENT: PERRLA, EOM intact.   Pulmonary: normal breath sounds, No wheezing.  CardiovascularNormal S1,S2.  No m/r/g.   Abdomen: Benign, Soft, non-tender. Renal:  No costovertebral tenderness  GU:  No performed at this time. Endoc: No evident thyromegaly, no signs of acromegaly. Skin:   warm, no rashes, no ecchymosis  Extremities: normal, no cyanosis, clubbing.  Other findings:    LABORATORY PANEL:   CBC No results for input(s): WBC, HGB, HCT, PLT in the last 168 hours. ------------------------------------------------------------------------------------------------------------------  Chemistries  No results for input(s): NA, K, CL, CO2, GLUCOSE, BUN, CREATININE, CALCIUM, MG, AST, ALT, ALKPHOS, BILITOT in the last 168 hours.  Invalid input(s): GFRCGP ------------------------------------------------------------------------------------------------------------------  Cardiac Enzymes No results for input(s): TROPONINI in the last 168 hours. ------------------------------------------------------------  RADIOLOGY:  No results found.     Thank  you for the consultation and for allowing [facility] to assist in the care of your  patient. Our recommendations are noted above.  Please contact us if we can be of further service.   Marda Stalker, MD.  Board Certified in Internal Medicine, Pulmonary Medicine, [place], and Sleep Medicine.  [facility] Office Number: [phone]  Patricia Pesa, M.D.  Merton Border, M.D  04/27/2017

## 2017-04-27 NOTE — Telephone Encounter (Signed)
-----   Message from Nori Riis, PA-C sent at 04/27/2017  7:33 AM EDT ----- Please let Mr. Parcel know that his PSA is undetectable.

## 2017-04-27 NOTE — Telephone Encounter (Signed)
Called pt.

## 2017-04-27 NOTE — Progress Notes (Signed)
Daily Session Note  Patient Details  Name: Johnathan [name] MRN: [id number] Date of Birth: [birth year] Referring Provider:     Pulmonary Rehab from 04/05/2017 in [facility] Cardiac and Pulmonary Rehab  Referring Provider  Ramachandran      Encounter Date: 04/27/2017  Check In: Session Check In - 04/27/17 1002      Check-In   Location  [facility]-Cardiac & Pulmonary Rehab    Staff Present  Alberteen Sam, MA, RCEP, CCRP, Exercise Physiologist;Amanda Oletta Darter, BA, ACSM CEP, Exercise Physiologist;Joseph Flavia Shipper    Supervising physician immediately available to respond to emergencies  [facility] immediately available ER MD    Physician(s)  Drs. Lord and [employer]    Medication changes reported      No    Fall or balance concerns reported     No    Warm-up and Cool-down  Performed as group-led Higher education careers adviser Performed  Yes    VAD Patient?  No      Pain Assessment   Currently in Pain?  No/denies          Social History   Tobacco Use  Smoking Status Former Smoker  . Packs/day: 1.00  . Years: 30.00  . Pack years: 30.00  . Types: Cigarettes  . Last attempt to quit: 02/16/2003  . Years since quitting: 14.2  Smokeless Tobacco Never Used  Tobacco Comment   is exposed to second hand smoke    Goals Met:  Proper associated with RPD/PD & O2 Sat Independence with exercise equipment Using PLB without cueing & demonstrates good technique Exercise tolerated well No report of cardiac concerns or symptoms Strength training completed today  Goals Unmet:  Not Applicable  Comments: Pt able to follow exercise prescription today without complaint.  Will continue to monitor for progression.    Dr. Emily Filbert is Medical Director for [place] and [facility].

## 2017-04-28 ENCOUNTER — Encounter: Payer: Self-pay | Admitting: Internal Medicine

## 2017-04-28 ENCOUNTER — Ambulatory Visit (INDEPENDENT_AMBULATORY_CARE_PROVIDER_SITE_OTHER): Payer: Medicare Other | Admitting: Internal Medicine

## 2017-04-28 VITALS — BP 132/66 | HR 60 | Resp 16 | Ht 68.0 in | Wt 228.0 lb

## 2017-04-28 DIAGNOSIS — J449 Chronic obstructive pulmonary disease, unspecified: Secondary | ICD-10-CM

## 2017-04-28 MED ORDER — TIOTROPIUM BROMIDE MONOHYDRATE 2.5 MCG/ACT IN AERS: 2.0000 | INHALATION_SPRAY | Freq: Every day | RESPIRATORY_TRACT | 5 refills | Status: DC

## 2017-04-28 MED ORDER — FLUTICASONE-SALMETEROL 100-50 MCG/DOSE IN AEPB: 1.0000 | INHALATION_SPRAY | Freq: Two times a day (BID) | RESPIRATORY_TRACT | 1 refills | Status: DC

## 2017-04-28 NOTE — Patient Instructions (Addendum)
Continue advair and spiriva, rinse mouse after advair.   Remove pets from bedroom.

## 2017-04-29 DIAGNOSIS — J449 Chronic obstructive pulmonary disease, unspecified: Secondary | ICD-10-CM | POA: Diagnosis not present

## 2017-04-29 NOTE — Progress Notes (Signed)
Daily Session Note  Patient Details  Name: Johnathan [name] MRN: [id number] Date of Birth: 03/23/1925 Referring Provider:     Pulmonary Rehab from 04/05/2017 in [facility] Cardiac and Pulmonary Rehab  Referring Provider  Ramachandran      Encounter Date: 04/29/2017  Check In: Session Check In - 04/29/17 1010      Check-In   Location  [facility]Cardiac & Pulmonary Rehab    Staff Present  Renita Papa, RN BSN;Mandi [place], BS, PEC;Connelly [place]    Supervising physician immediately available to respond to emergencies  [facility] immediately available ER MD    Physician(s)  Dr. Burlene Arnt and Corky Downs    Medication changes reported      No    Fall or balance concerns reported     No    Tobacco Cessation  No Change    Warm-up and Cool-down  Performed as group-led instruction    Resistance Training Performed  Yes    VAD Patient?  No      Pain Assessment   Currently in Pain?  No/denies          Social History   Tobacco Use  Smoking Status Former Smoker  . Packs/day: 1.00  . Years: 30.00  . Pack years: 30.00  . Types: Cigarettes  . Last attempt to quit: 02/16/2003  . Years since quitting: 14.2  Smokeless Tobacco Never Used  Tobacco Comment   is exposed to second hand smoke    Goals Met:  Independence with exercise equipment Exercise tolerated well No report of cardiac concerns or symptoms Strength training completed today  Goals Unmet:  Not Applicable  Comments: Pt able to follow exercise prescription today without complaint.  Will continue to monitor for progression.   Dr. Emily Filbert is Medical Director for [place] and [facility].

## 2017-05-02 DIAGNOSIS — J449 Chronic obstructive pulmonary disease, unspecified: Secondary | ICD-10-CM

## 2017-05-02 NOTE — Progress Notes (Signed)
Daily Session Note  Patient Details  Name: Johnathan [name] MRN: [id number] Date of Birth: 04-Nov-1925 Referring Provider:     Pulmonary Rehab from 04/05/2017 in [facility] Cardiac and Pulmonary Rehab  Referring Provider  Ramachandran      Encounter Date: 05/02/2017  Check In: Session Check In - 05/02/17 0957      Check-In   Location  [facility] Pulmonary Re[facility]    Staff Present  Earlean Shawl, BS, ACSM CEP, Exercise Physiologist;Amanda Oletta Darter, BA, ACSM CEP, Exercise Physiologist;Shaena Parkerson Flavia Shipper    Supervising physician immediately available to respond to emergencies  [facility] immediately available ER MD    Physician(s)  Dr. Burlene Arnt and Corky Downs    Medication changes reported      No    Fall or balance concerns reported     No    Tobacco Cessation  No Change    Warm-up and Cool-down  Performed as group-led instruction    Resistance Training Performed  Yes    VAD Patient?  No      Pain Assessment   Currently in Pain?  No/denies          Social History   Tobacco Use  Smoking Status Former Smoker  . Packs/day: 1.00  . Years: 30.00  . Pack years: 30.00  . Types: Cigarettes  . Last attempt to quit: 02/16/2003  . Years since quitting: 14.2  Smokeless Tobacco Never Used  Tobacco Comment   is exposed to second hand smoke    Goals Met:  Independence with exercise equipment Exercise tolerated well No report of cardiac concerns or symptoms Strength training completed today  Goals Unmet:  Not Applicable  Comments: Pt able to follow exercise prescription today without complaint.  Will continue to monitor for progression.   Dr. Emily Filbert is Medical Director for [place] and [facility].

## 2017-05-04 DIAGNOSIS — J449 Chronic obstructive pulmonary disease, unspecified: Secondary | ICD-10-CM | POA: Diagnosis not present

## 2017-05-04 NOTE — Progress Notes (Signed)
Daily Session Note  Patient Details  Name: Johnathan [name] MRN: [id number] Date of Birth: 1925-12-12 Referring Provider:     Pulmonary Rehab from 04/05/2017 in [facility] Cardiac and Pulmonary Rehab  Referring Provider  Ramachandran      Encounter Date: 05/04/2017  Check In: Session Check In - 05/04/17 1011      Check-In   Location  [facility]Cardiac [facility] Pulmonary Rehab    Staff Present  Nada Maclachlan, BA, ACSM CEP, Exercise Physiologist;Joseph Darrin Nipper, [place], RCEP, CCRP, Exercise Physiologist    Supervising physician immediately available to respond to emergencies  [facility] immediately available ER MD    Physician(s)  Dr. Joni Fears and Jimmye Norman    Medication changes reported      No    Fall or balance concerns reported     No    Tobacco Cessation  No Change    Warm-up and Cool-down  Performed as group-led instruction    Resistance Training Performed  Yes    VAD Patient?  No      Pain Assessment   Currently in Pain?  No/denies          Social History   Tobacco Use  Smoking Status Former Smoker  . Packs/day: 1.00  . Years: 30.00  . Pack years: 30.00  . Types: Cigarettes  . Last attempt to quit: 02/16/2003  . Years since quitting: 14.2  Smokeless Tobacco Never Used  Tobacco Comment   is exposed to second hand smoke    Goals Met:  Independence with exercise equipment Exercise tolerated well No report of cardiac concerns or symptoms Strength training completed today  Goals Unmet:  Not Applicable  Comments: Pt able to follow exercise prescription today without complaint.  Will continue to monitor for progression.   Dr. Emily Filbert is Medical Director for [place] and [facility].

## 2017-05-09 ENCOUNTER — Ambulatory Visit (INDEPENDENT_AMBULATORY_CARE_PROVIDER_SITE_OTHER): Payer: Medicare Other | Admitting: Family Medicine

## 2017-05-09 ENCOUNTER — Encounter: Payer: Self-pay | Admitting: Family Medicine

## 2017-05-09 ENCOUNTER — Ambulatory Visit: Payer: Self-pay | Admitting: Family Medicine

## 2017-05-09 VITALS — BP 136/74 | HR 60 | Temp 98.3°F | Resp 16 | Wt 231.0 lb

## 2017-05-09 DIAGNOSIS — J449 Chronic obstructive pulmonary disease, unspecified: Secondary | ICD-10-CM

## 2017-05-09 DIAGNOSIS — E7800 Pure hypercholesterolemia, unspecified: Secondary | ICD-10-CM

## 2017-05-09 DIAGNOSIS — I10 Essential (primary) hypertension: Secondary | ICD-10-CM

## 2017-05-09 DIAGNOSIS — I2510 Atherosclerotic heart disease of native coronary artery without angina pectoris: Secondary | ICD-10-CM

## 2017-05-09 DIAGNOSIS — G4700 Insomnia, unspecified: Secondary | ICD-10-CM

## 2017-05-09 NOTE — Progress Notes (Signed)
Patient: Johnathan [name] Male    DOB: Jul 09, 1925   84 y.o.   MRN: [id number] Visit Date: 05/09/2017  Today's Provider: Wilhemena Durie, MD   Chief Complaint  Patient presents with  . Hypertension  . Hyperlipidemia  . COPD   Subjective:    Hypertension  This is a chronic problem. The problem is unchanged. The problem is controlled. Associated symptoms include malaise/fatigue and shortness of breath. Pertinent negatives include no chest pain, headaches, palpitations or peripheral edema. There are no associated agents to hypertension. Past treatments include calcium channel blockers. There are no compliance problems.   Hyperlipidemia  This is a chronic problem. The problem is controlled. Recent lipid tests were reviewed and are normal. Associated symptoms include shortness of breath. Pertinent negatives include no chest pain. There are no compliance problems.   COPD  He complains of shortness of breath and wheezing. There is no cough. This is a chronic problem. The problem has been unchanged. Associated symptoms include malaise/fatigue. Pertinent negatives include no appetite change, chest pain, fever or headaches. His past medical history is significant for COPD.   Lab Results  Component Value Date   CHOL 128 05/24/2016   CHOL 141 07/24/2015   CHOL 145 06/24/2014   Lab Results  Component Value Date   HDL 29 (L) 05/24/2016   HDL 38 (L) 07/24/2015   HDL 37 06/24/2014   Lab Results  Component Value Date   LDLCALC 75 05/24/2016   LDLCALC 82 07/24/2015   LDLCALC 84 06/24/2014   Lab Results  Component Value Date   TRIG 118 05/24/2016   TRIG 107 07/24/2015   TRIG 118 06/24/2014   Lab Results  Component Value Date   CHOLHDL 4.4 05/24/2016   No results found for: LDLDIRECT  BP Readings from Last 3 Encounters:  05/09/17 136/74  04/28/17 132/66  04/26/17 (!) 149/73        Allergies  Allergen Reactions  . Sulfa Antibiotics Other (See Comments)    rash  .  Penicillins Rash    Has patient had a PCN reaction causing immediate rash, facial/tongue/throat swelling, SOB or lightheadedness with hypotension: No Has patient had a PCN reaction causing severe rash involving mucus membranes or skin necrosis: No Has patient had a PCN reaction that required hospitalization: No Has patient had a PCN reaction occurring within the last 10 years: No If all of the above answers are "NO", then may proceed with Cephalosporin use.      Current Outpatient Medications:  .  albuterol (PROVENTIL HFA;VENTOLIN HFA) 108 (90 Base) MCG/ACT inhaler, Inhale 2 puffs into the lungs every 6 (six) hours as needed for wheezing or shortness of breath., Disp: 1 Inhaler, Rfl: 1 .  amLODipine (NORVASC) 5 MG tablet, Take 1 tablet (5 mg total) by mouth daily., Disp: 90 tablet, Rfl: 3 .  aspirin 325 MG EC tablet, Take 325 mg by mouth daily., Disp: , Rfl:  .  brimonidine (ALPHAGAN) 0.2 % ophthalmic solution, Place 1 drop into both eyes 2 (two) times daily. , Disp: , Rfl:  .  esomeprazole (NEXIUM) 20 MG capsule, TAKE 1 CAPSULE TWICE A DAY, Disp: 180 capsule, Rfl: 2 .  Fluticasone-Salmeterol (ADVAIR DISKUS) 100-50 MCG/DOSE AEPB, Inhale 1 puff into the lungs 2 (two) times daily., Disp: 1 each, Rfl: 1 .  latanoprost (XALATAN) 0.005 % ophthalmic solution, Place 1 drop into both eyes at bedtime. , Disp: , Rfl:  .  Polyethylene Glycol 3350 (MIRALAX PO), Take  by mouth as needed., Disp: , Rfl:  .  prednisoLONE acetate (PRED FORTE) 1 % ophthalmic suspension, Place 1 drop into the left eye daily. , Disp: , Rfl:  .  simvastatin (ZOCOR) 20 MG tablet, TAKE 1 TABLET DAILY, Disp: 90 tablet, Rfl: 3 .  Tiotropium Bromide Monohydrate (SPIRIVA RESPIMAT) 2.5 MCG/ACT AERS, Inhale 2 puffs into the lungs daily., Disp: 4 g, Rfl: 5 .  traZODone (DESYREL) 50 MG tablet, Take 0.5-1 tablets (25-50 mg total) by mouth at bedtime as needed for sleep., Disp: 90 tablet, Rfl: 1 .  AMBULATORY NON FORMULARY MEDICATION,  Medication Name: Flutter valve DX:J44.9, Disp: 1 each, Rfl: 0 .  amLODipine (NORVASC) 5 MG tablet, TAKE 1 TABLET DAILY, Disp: 90 tablet, Rfl: 3  Current Facility-Administered Medications:  .  ipratropium-albuterol (DUONEB) 0.5-2.5 (3) MG/3ML nebulizer solution 3 mL, 3 mL, Nebulization, Once, Jerrol Banana., MD  Review of Systems  Constitutional: Positive for fatigue and malaise/fatigue. Negative for activity change, appetite change, chills, diaphoresis, fever and unexpected weight change.  Respiratory: Positive for shortness of breath and wheezing. Negative for apnea, cough, choking, chest tightness and stridor.   Cardiovascular: Negative for chest pain, palpitations and leg swelling.  Gastrointestinal: Positive for constipation (Chronic issue). Negative for abdominal distention, abdominal pain, anal bleeding, blood in stool, diarrhea, nausea, rectal pain and vomiting.  Endocrine: Negative.   Allergic/Immunologic: Negative.   Neurological: Positive for light-headedness (Occasionally). Negative for dizziness and headaches.  Psychiatric/Behavioral: Negative.     Social History   Tobacco Use  . Smoking status: Former Smoker    Packs/day: 1.00    Years: 30.00    Pack years: 30.00    Types: Cigarettes    Last attempt to quit: 02/16/2003    Years since quitting: 14.2  . Smokeless tobacco: Never Used  . Tobacco comment: is exposed to second hand smoke  Substance Use Topics  . Alcohol use: No   Objective:   BP 136/74 (BP Location: Right Arm, Patient Position: Sitting, Cuff Size: Normal)   Pulse 60   Temp 98.3 F (36.8 C) (Oral)   Resp 16   Wt 231 lb (104.8 kg)   SpO2 96%   BMI 35.12 kg/m  Vitals:   05/09/17 0852  BP: 136/74  Pulse: 60  Resp: 16  Temp: 98.3 F (36.8 C)  TempSrc: Oral  SpO2: 96%  Weight: 231 lb (104.8 kg)     Physical Exam  Constitutional: He is oriented to person, place, and time. He appears well-developed and well-nourished.  HENT:  Head:  Normocephalic and atraumatic.  Right Ear: External ear normal.  Left Ear: External ear normal.  Nose: Nose normal.  Eyes: Conjunctivae are normal. No scleral icterus.  Neck: Normal range of motion. Neck supple. No thyromegaly present.  Cardiovascular: Normal rate, regular rhythm and normal heart sounds.  Pulmonary/Chest: Effort normal. He has wheezes (Minor inspiratory wheezes).  Abdominal: Soft.  Musculoskeletal: Normal range of motion.  Neurological: He is alert and oriented to person, place, and time.  Skin: Skin is warm and dry.  Psychiatric: He has a normal mood and affect. His behavior is normal. Judgment and thought content normal.        Assessment & Plan:     1. Essential hypertension Stable continue current medications.  - CBC with Differential/Platelet - Comprehensive metabolic panel - TSH  2. Hypercholesteremia Stable; will check labs today.   - CBC with Differential/Platelet - Comprehensive metabolic panel - Lipid panel  3. Insomnia, unspecified type Still  having trouble sleeping at night secondary to COPD and Leg pain.  Will continue with pulmonary rehab.    4. Chronic obstructive pulmonary disease, unspecified COPD type ([place]) Stable, encouraged pt to continue with pulmonary rehab.    - CBC with Differential/Platelet 5.CAD All risk factors treated.  Patient seen and examined by Miguel Aschoff, MD, and note scribed by Ashley Royalty, CMA       I have done the exam and reviewed the above chart and it is accurate to the best of my knowledge. Development worker, community has been used in this note in any air is in the dictation or transcription are unintentional.  Wilhemena Durie, MD  [place]

## 2017-05-10 ENCOUNTER — Telehealth: Payer: Self-pay

## 2017-05-10 ENCOUNTER — Ambulatory Visit: Payer: Medicare Other | Admitting: Family Medicine

## 2017-05-10 LAB — LIPID PANEL
Chol/HDL Ratio: 3.7 ratio (ref 0.0–5.0)
Cholesterol, Total: 134 mg/dL (ref 100–199)
HDL: 36 mg/dL — ABNORMAL LOW (ref >39)
LDL Calculated: 80 mg/dL (ref 0–99)
Triglycerides: 88 mg/dL (ref 0–149)
VLDL Cholesterol Cal: 18 mg/dL (ref 5–40)

## 2017-05-10 LAB — CBC WITH DIFFERENTIAL/PLATELET
Basophils Absolute: 0 10*3/uL (ref 0.0–0.2)
Basos: 0 %
EOS (ABSOLUTE): 0.1 10*3/uL (ref 0.0–0.4)
Eos: 1 %
Hematocrit: 43.1 % (ref 37.5–51.0)
Hemoglobin: 14 g/dL (ref 13.0–17.7)
Immature Grans (Abs): 0 10*3/uL (ref 0.0–0.1)
Immature Granulocytes: 0 %
Lymphocytes Absolute: 1.5 10*3/uL (ref 0.7–3.1)
Lymphs: 23 %
MCH: 30.5 pg (ref 26.6–33.0)
MCHC: 32.5 g/dL (ref 31.5–35.7)
MCV: 94 fL (ref 79–97)
Monocytes Absolute: 0.7 10*3/uL (ref 0.1–0.9)
Monocytes: 10 %
Neutrophils Absolute: 4.2 10*3/uL (ref 1.4–7.0)
Neutrophils: 66 %
Platelets: 209 10*3/uL (ref 150–379)
RBC: 4.59 x10E6/uL (ref 4.14–5.80)
RDW: 15.3 % (ref 12.3–15.4)
WBC: 6.4 10*3/uL (ref 3.4–10.8)

## 2017-05-10 LAB — COMPREHENSIVE METABOLIC PANEL
ALT: 9 IU/L (ref 0–44)
AST: 11 IU/L (ref 0–40)
Albumin/Globulin Ratio: 1.7 (ref 1.2–2.2)
Albumin: 4 g/dL (ref 3.5–4.7)
Alkaline Phosphatase: 73 IU/L (ref 39–117)
BUN/Creatinine Ratio: 16 (ref 10–24)
BUN: 21 mg/dL (ref 8–27)
Bilirubin Total: 0.4 mg/dL (ref 0.0–1.2)
CO2: 23 mmol/L (ref 20–29)
Calcium: 8.8 mg/dL (ref 8.6–10.2)
Chloride: 109 mmol/L — ABNORMAL HIGH (ref 96–106)
Creatinine, Ser: 1.31 mg/dL — ABNORMAL HIGH (ref 0.76–1.27)
GFR calc Af Amer: 55 mL/min/{1.73_m2} — ABNORMAL LOW (ref >59)
GFR calc non Af Amer: 48 mL/min/{1.73_m2} — ABNORMAL LOW (ref >59)
Globulin, Total: 2.4 g/dL (ref 1.5–4.5)
Glucose: 102 mg/dL — ABNORMAL HIGH (ref 65–99)
Potassium: 5.3 mmol/L — ABNORMAL HIGH (ref 3.5–5.2)
Sodium: 145 mmol/L — ABNORMAL HIGH (ref 134–144)
Total Protein: 6.4 g/dL (ref 6.0–8.5)

## 2017-05-10 LAB — TSH: TSH: 1.73 u[IU]/mL (ref 0.450–4.500)

## 2017-05-10 NOTE — Telephone Encounter (Signed)
NA. Will try later on.

## 2017-05-10 NOTE — Telephone Encounter (Signed)
-----   Message from Jerrol Banana., MD sent at 05/10/2017  1:40 PM EDT ----- Push water a little during day. Otherwise ok for now.

## 2017-05-11 DIAGNOSIS — J449 Chronic obstructive pulmonary disease, unspecified: Secondary | ICD-10-CM

## 2017-05-11 NOTE — Telephone Encounter (Signed)
No answer

## 2017-05-11 NOTE — Progress Notes (Signed)
Daily Session Note  Patient Details  Name: Johnathan [name] MRN: [id number] Date of Birth: December 07, 1925 Referring Provider:     Pulmonary Rehab from 04/05/2017 in [facility] Cardiac and Pulmonary Rehab  Referring Provider  Ramachandran      Encounter Date: 05/11/2017  Check In: Session Check In - 05/11/17 1015      Check-In   Location  [facility]Cardiac & Pulmonary Rehab    Staff Present  Nada Maclachlan, BA, ACSM CEP, Exercise Physiologist;Kiran Carline Darrin Nipper, [place], RCEP, CCRP, Exercise Physiologist    Supervising physician immediately available to respond to emergencies  [facility] immediately available ER MD    Physician(s)  Dr. Mariea Clonts and Jimmye Norman    Medication changes reported      No    Fall or balance concerns reported     No    Tobacco Cessation  No Change    Warm-up and Cool-down  Performed as group-led instruction    Resistance Training Performed  Yes    VAD Patient?  No      Pain Assessment   Currently in Pain?  No/denies          Social History   Tobacco Use  Smoking Status Former Smoker  . Packs/day: 1.00  . Years: 30.00  . Pack years: 30.00  . Types: Cigarettes  . Last attempt to quit: 02/16/2003  . Years since quitting: 14.2  Smokeless Tobacco Never Used  Tobacco Comment   is exposed to second hand smoke    Goals Met:  Independence with exercise equipment Exercise tolerated well No report of cardiac concerns or symptoms Strength training completed today  Goals Unmet:  Not Applicable  Comments: Pt able to follow exercise prescription today without complaint.  Will continue to monitor for progression.   Dr. Emily Filbert is Medical Director for [place] and [facility].

## 2017-05-13 ENCOUNTER — Encounter: Payer: Medicare Other | Admitting: *Deleted

## 2017-05-13 DIAGNOSIS — J449 Chronic obstructive pulmonary disease, unspecified: Secondary | ICD-10-CM | POA: Diagnosis not present

## 2017-05-13 NOTE — Progress Notes (Signed)
Daily Session Note  Patient Details  Name: Johnathan [name] MRN: [id number] Date of Birth: [birth year] Referring Provider:     Pulmonary Rehab from 04/05/2017 in [facility] Cardiac and Pulmonary Rehab  Referring Provider  Ramachandran      Encounter Date: 05/13/2017  Check In: Session Check In - 05/13/17 1019      Check-In   Location  [facility]Cardiac [facility] Pulmonary Rehab    Staff Present  Nyoka Cowden, RN, BSN, MA;Meredith Sherryll Burger, RN Vickki Hearing, BA, ACSM CEP, Exercise Physiologist    Supervising physician immediately available to respond to emergencies  [facility] immediately available ER MD    Physician(s)  Dr. Jacqualine Code and Reita Cliche    Medication changes reported      No    Fall or balance concerns reported     No    Warm-up and Cool-down  Performed as group-led instruction    Resistance Training Performed  Yes    VAD Patient?  No      Pain Assessment   Currently in Pain?  No/denies          Social History   Tobacco Use  Smoking Status Former Smoker  . Packs/day: 1.00  . Years: 30.00  . Pack years: 30.00  . Types: Cigarettes  . Last attempt to quit: 02/16/2003  . Years since quitting: 14.2  Smokeless Tobacco Never Used  Tobacco Comment   is exposed to second hand smoke    Goals Met:  Proper associated with RPD/PD & O2 Sat Independence with exercise equipment Using PLB without cueing & demonstrates good technique Exercise tolerated well Strength training completed today  Goals Unmet:  Not Applicable  Comments: Pt able to follow exercise prescription today without complaint.  Will continue to monitor for progression.    Dr. Emily Filbert is Medical Director for [place] and [facility].

## 2017-05-14 ENCOUNTER — Emergency Department
Admission: EM | Admit: 2017-05-14 | Discharge: 2017-05-14 | Disposition: A | Discharge status: Home / Self Care | Payer: Medicare Other | Attending: Emergency Medicine | Admitting: Emergency Medicine

## 2017-05-14 ENCOUNTER — Emergency Department: Payer: Medicare Other

## 2017-05-14 ENCOUNTER — Other Ambulatory Visit: Payer: Self-pay

## 2017-05-14 DIAGNOSIS — Z8673 Personal history of transient ischemic attack (TIA), and cerebral infarction without residual deficits: Secondary | ICD-10-CM | POA: Insufficient documentation

## 2017-05-14 DIAGNOSIS — R05 Cough: Secondary | ICD-10-CM | POA: Insufficient documentation

## 2017-05-14 DIAGNOSIS — R609 Edema, unspecified: Secondary | ICD-10-CM | POA: Diagnosis not present

## 2017-05-14 DIAGNOSIS — Z87891 Personal history of nicotine dependence: Secondary | ICD-10-CM | POA: Insufficient documentation

## 2017-05-14 DIAGNOSIS — Z8546 Personal history of malignant neoplasm of prostate: Secondary | ICD-10-CM | POA: Insufficient documentation

## 2017-05-14 DIAGNOSIS — I509 Heart failure, unspecified: Secondary | ICD-10-CM | POA: Insufficient documentation

## 2017-05-14 DIAGNOSIS — Z951 Presence of aortocoronary bypass graft: Secondary | ICD-10-CM | POA: Insufficient documentation

## 2017-05-14 DIAGNOSIS — I2510 Atherosclerotic heart disease of native coronary artery without angina pectoris: Secondary | ICD-10-CM | POA: Diagnosis not present

## 2017-05-14 DIAGNOSIS — R0602 Shortness of breath: Secondary | ICD-10-CM | POA: Diagnosis present

## 2017-05-14 DIAGNOSIS — I10 Essential (primary) hypertension: Secondary | ICD-10-CM | POA: Insufficient documentation

## 2017-05-14 DIAGNOSIS — R0600 Dyspnea, unspecified: Secondary | ICD-10-CM | POA: Insufficient documentation

## 2017-05-14 DIAGNOSIS — J449 Chronic obstructive pulmonary disease, unspecified: Secondary | ICD-10-CM | POA: Diagnosis not present

## 2017-05-14 LAB — CBC
HCT: 44.9 % (ref 40.0–52.0)
Hemoglobin: 14.8 g/dL (ref 13.0–18.0)
MCH: 30.8 pg (ref 26.0–34.0)
MCHC: 33 g/dL (ref 32.0–36.0)
MCV: 93.2 fL (ref 80.0–100.0)
Platelets: 220 10*3/uL (ref 150–440)
RBC: 4.82 MIL/uL (ref 4.40–5.90)
RDW: 15.5 % — ABNORMAL HIGH (ref 11.5–14.5)
WBC: 5.3 10*3/uL (ref 3.8–10.6)

## 2017-05-14 LAB — BASIC METABOLIC PANEL
Anion gap: 8 (ref 5–15)
BUN: 18 mg/dL (ref 6–20)
CO2: 24 mmol/L (ref 22–32)
Calcium: 8.7 mg/dL — ABNORMAL LOW (ref 8.9–10.3)
Chloride: 107 mmol/L (ref 101–111)
Creatinine, Ser: 1.43 mg/dL — ABNORMAL HIGH (ref 0.61–1.24)
GFR calc Af Amer: 49 mL/min — ABNORMAL LOW (ref >60)
GFR calc non Af Amer: 42 mL/min — ABNORMAL LOW (ref >60)
Glucose, Bld: 102 mg/dL — ABNORMAL HIGH (ref 65–99)
Potassium: 4.9 mmol/L (ref 3.5–5.1)
Sodium: 139 mmol/L (ref 135–145)

## 2017-05-14 LAB — TROPONIN I: Troponin I: <0.03 ng/mL (ref <0.03)

## 2017-05-14 LAB — BRAIN NATRIURETIC PEPTIDE: B Natriuretic Peptide: 490 pg/mL — ABNORMAL HIGH (ref 0.0–100.0)

## 2017-05-14 MED ORDER — FUROSEMIDE 40 MG PO TABS: 20.0000 mg | ORAL_TABLET | Freq: Once | ORAL | Status: DC

## 2017-05-14 MED ORDER — FUROSEMIDE 40 MG PO TABS
40.0000 mg | ORAL_TABLET | Freq: Once | ORAL | Status: AC
  Administered 2017-05-14: 40 mg via ORAL
  Filled 2017-05-14: qty 1

## 2017-05-14 MED ORDER — IPRATROPIUM-ALBUTEROL 0.5-2.5 (3) MG/3ML IN SOLN
3.0000 mL | Freq: Once | RESPIRATORY_TRACT | Status: AC
  Administered 2017-05-14: 3 mL via RESPIRATORY_TRACT
  Filled 2017-05-14: qty 3

## 2017-05-14 MED ORDER — FUROSEMIDE 20 MG PO TABS: 20.0000 mg | ORAL_TABLET | Freq: Every day | ORAL | 11 refills | Status: DC

## 2017-05-14 NOTE — ED Notes (Signed)
This RN to bedside, apologized for delay. Pt states understanding. Will continue to monitor for further patient needs. Explained delay in patient discharge. Pt states understanding.

## 2017-05-14 NOTE — ED Notes (Signed)
Pt sitting up on side of bed to use urinal. Will continue to monitor for further patient needs.

## 2017-05-14 NOTE — ED Notes (Addendum)
NAD noted at time of D/C. Pt denies questions or concerns. Pt taken to the lobby via wheelchair at this time. Bryan Lemma signed for receiving D/C instructions.

## 2017-05-14 NOTE — ED Triage Notes (Signed)
Pt arrived via POV with c/o progressive shortness of breath, states he was in the hospital in January and states he has been short of breath since.    Cough present productive at this with phlegm.  Pt states he is short of breath doing regular activities like shaving.  Pt reports shortness of breath at rest.  Pt does not use oxygen and does use inhalers at home.

## 2017-05-14 NOTE — ED Provider Notes (Signed)
[facility] Emergency Department Provider Note       Time seen: ----------------------------------------- 12:22 PM on 05/14/2017 -----------------------------------------   I have reviewed the triage vital signs and the nursing notes.  HISTORY   Chief Complaint Shortness of Breath    HPI Johnathan [name] is a 84 y.o. male with a history of abdominal pain, arthritis, asthma, coronary artery disease, COPD, hypertension and hyperlipidemia who presents to the ED for progressive shortness of breath.  Patient states she was in the hospital in January and states he has been short of breath since that period of time.  He has had a cough and presents with productive sputum here.  Patient states he is short of breath even with benign activity or at rest.  He does not use oxygen but does use inhalers at home.  Past Medical History:  Diagnosis Date  . Abdominal pain    INTERMITTENT  . ARMD (age related macular degeneration)   . Arthritis    hands  . Asthma   . Atherosclerosis of native arteries of the extremities, unspecified   . CAD (coronary artery disease)    3 vessel/ DR FATH CARDIOLOGIST GAVE CLEARANCE  . Colitis    HX OF  . Constipation   . COPD (chronic obstructive pulmonary disease) ([place])   . GERD (gastroesophageal reflux disease)   . Glaucoma    both eyes/ PATIENT CAN NOT SEE  . HOH (hard of hearing)   . Hyperlipidemia   . Hypertension   . Neuromuscular disorder (HCC)    numbness in feet  . Occlusion and stenosis of carotid artery without mention of cerebral infarction   . Prostate cancer ([place])   . Prostate pain   . Shortness of breath dyspnea   . TIA (transient ischemic attack)    hx of/ 10 yrs ago  . Vertigo   . Wears dentures    upper and lower    Patient Active Problem List   Diagnosis Date Noted  . Asthma exacerbation 03/11/2017  . Vertigo 06/01/2016  . Dizziness 05/23/2016  . Renal artery stenosis ([place]) 12/29/2015  . Aneurysm of  thoracic aorta ([place]) 12/29/2015  . 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 Hydrocele, bilateral 09/09/2014  . Hydrocele sac 08/22/2014  . History of prostate cancer 08/22/2014  . ASCVD (arteriosclerotic cardiovascular disease) 08/06/2014  . Cardiomyopathy, ischemic 08/06/2014  . Prostate cancer ([place]) 08/06/2014  . Obesity 08/06/2014  . Insomnia 08/06/2014  . Neuropathy 08/06/2014  . Abdominal aortic aneurysm ([place]) 08/06/2014  . COPD (chronic obstructive pulmonary disease) ([place]) 08/06/2014  . Hyperglycemia 08/06/2014  . Acid reflux 08/05/2014  . Age-related macular degeneration 08/05/2014  . Branch retinal vein occlusion 08/05/2014  . 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 Hypercholesteremia 08/05/2014  . Pseudoaphakia 08/05/2014  . Secondary glaucoma 08/05/2014  . Osteoarthritis 08/05/2014  . Carotid stenosis 04/02/2014  . PAD (peripheral artery disease) ([place]) 04/02/2014  . 3-vessel CAD 11/06/2013  . Colonic stricture ([place]) 02/21/2013  . BP (high blood pressure) 11/22/2012  . Kidney failure 11/21/2012  . BK (bullous keratopathy) 06/28/2012    Past Surgical History:  Procedure Laterality Date  . aortoiliac bypass   1986  . CAROTID ENDARTERECTOMY Right 2003  . CATARACT EXTRACTION    . COLONOSCOPY  02-06-13   Dr Candace Cruise  . COLONOSCOPY WITH PROPOFOL N/A 07/09/2015   Procedure: COLONOSCOPY WITH PROPOFOL;  Surgeon: Hulen Luster, MD;  Location: [place];  Service: Gastroenterology;  Laterality: N/A;  . CORONARY ARTERY  BYPASS GRAFT  2000   x3  . HERNIA REPAIR  1992  . PROSTATE SURGERY  2001    Allergies Sulfa antibiotics and Penicillins  Social History Social History   Tobacco Use  . Smoking status: Former Smoker    Packs/day: 1.00    Years: 30.00    Pack years: 30.00    Types: Cigarettes    Last attempt to quit: 02/16/2003    Years since quitting: 14.2  . Smokeless tobacco: Never Used  . Tobacco comment: is exposed to second hand smoke  Substance  Use Topics  . Alcohol use: No  . Drug use: No   Review of Systems Constitutional: Negative for fever. Cardiovascular: Negative for chest pain. Respiratory: Positive for shortness of breath Gastrointestinal: Negative for abdominal pain, vomiting and diarrhea. Musculoskeletal: Negative for back pain. Skin: Negative for rash. Neurological: Negative for headaches, focal weakness or numbness.  All systems negative/normal/unremarkable except as stated in the HPI  ____________________________________________   PHYSICAL EXAM:  VITAL SIGNS: ED Triage Vitals  Enc Vitals Group     BP 05/14/17 1206 (!) 125/107     Pulse Rate 05/14/17 1206 (!) 59     Resp 05/14/17 1206 20     Temp 05/14/17 1206 97.7 F (36.5 C)     Temp Source 05/14/17 1206 Oral     SpO2 05/14/17 1206 94 %     Weight 05/14/17 1205 227 lb (103 kg)     Height 05/14/17 1205 5\' 8"  (1.727 m)     Head Circumference --      Peak Flow --      Pain Score 05/14/17 1205 0     Pain Loc --      Pain Edu? --      Excl. in [place]? --    Constitutional: Alert and oriented. Well appearing and in no distress. Eyes: Conjunctivae are normal. Normal extraocular movements. ENT   Head: Normocephalic and atraumatic.   Nose: No congestion/rhinnorhea.   Mouth/Throat: Mucous membranes are moist.   Neck: No stridor. Cardiovascular: Normal rate, regular rhythm. No murmurs, rubs, or gallops. Respiratory: Normal respiratory effort without tachypnea nor retractions. Breath sounds are clear and equal bilaterally. No wheezes/rales/rhonchi. Gastrointestinal: Soft and nontender. Normal bowel sounds Musculoskeletal: Nontender with normal range of motion in extremities. No lower extremity tenderness nor edema. Neurologic:  Normal speech and language. No gross focal neurologic deficits are appreciated.  Skin:  Skin is warm, dry and intact. No rash noted. Psychiatric: Mood and affect are normal. Speech and behavior are normal.   ____________________________________________  EKG: Interpreted by me.  Atrial flutter with a 4-1 conduction, wide QRS, normal QT, normal axis.  ____________________________________________  ED COURSE:  As part of my medical decision making, I reviewed the following data within the [place] History obtained from family if available, nursing notes, old chart and ekg, as well as notes from prior ED visits. Patient presented for shortness of breath, we will assess with labs and imaging as indicated at this time.   Procedures ____________________________________________   LABS (pertinent positives/negatives)  Labs Reviewed  BASIC METABOLIC PANEL - Abnormal; Notable for the following components:      Result Value   Glucose, Bld 102 (*)    Creatinine, Ser 1.43 (*)    Calcium 8.7 (*)    GFR calc non Af Amer 42 (*)    GFR calc Af Amer 49 (*)    All other components within normal limits  CBC - Abnormal;  Notable for the following components:   RDW 15.5 (*)    All other components within normal limits  BRAIN NATRIURETIC PEPTIDE - Abnormal; Notable for the following components:   B Natriuretic Peptide 490.0 (*)    All other components within normal limits  TROPONIN I    RADIOLOGY Images were viewed by me  Chest x-ray IMPRESSION: Slightly increased interstitial opacities suspicious for mild interstitial edema.  New trace bilateral pleural effusions and minimal bibasilar atelectasis.  Cardiomegaly. ____________________________________________  DIFFERENTIAL DIAGNOSIS   COPD, CHF, pneumonia, URI, influenza  FINAL ASSESSMENT AND PLAN  Dyspnea, CHF   Plan: The patient had presented for dyspnea. Patient's labs did indicate some mild CHF.  Does have a history of ischemic cardiomyopathy.  I did start him on low-dose Lasix as I think this will help his breathing. Patient's imaging revealed some trace effusions and very mild edema.  I did offer admission but  he has declined, he will be discharged again as dictated above on low-dose Lasix.   Laurence Aly, MD   Note: This note was generated in part or whole with voice recognition software. Voice recognition is usually quite accurate but there are transcription errors that can and very often do occur. I apologize for any typographical errors that were not detected and corrected.     Earleen Newport, MD 05/14/17 [phone]

## 2017-05-16 ENCOUNTER — Encounter: Payer: Medicare Other | Attending: Internal Medicine

## 2017-05-16 DIAGNOSIS — J449 Chronic obstructive pulmonary disease, unspecified: Secondary | ICD-10-CM | POA: Insufficient documentation

## 2017-05-17 NOTE — Telephone Encounter (Signed)
Advised  ED 

## 2017-05-23 DIAGNOSIS — J449 Chronic obstructive pulmonary disease, unspecified: Secondary | ICD-10-CM

## 2017-05-23 NOTE — Progress Notes (Signed)
Pulmonary Individual Treatment Plan  Patient Details  Name: Johnathan [name] MRN: [id number] Date of Birth: March 23, 1925 Referring Provider:     Pulmonary Rehab from 04/05/2017 in [facility] Cardiac and Pulmonary Rehab  Referring Provider  Ramachandran      Initial Encounter Date:    Pulmonary Rehab from 04/05/2017 in [facility] Cardiac and Pulmonary Rehab  Date  04/05/17  Referring Provider  Ashby Dawes      Visit Diagnosis: COPD with chronic bronchitis and emphysema ([place])  Patient's Home Medications on Admission:  Current Outpatient Medications:  .  albuterol (PROVENTIL HFA;VENTOLIN HFA) 108 (90 Base) MCG/ACT inhaler, Inhale 2 puffs into the lungs every 6 (six) hours as needed for wheezing or shortness of breath., Disp: 1 Inhaler, Rfl: 1 .  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  amLODipine (NORVASC) 5 MG tablet, Take 1 tablet (5 mg total) by mouth daily., Disp: 90 tablet, Rfl: 3 .  amLODipine (NORVASC) 5 MG tablet, TAKE 1 TABLET DAILY, Disp: 90 tablet, Rfl: 3 .  aspirin 325 MG EC tablet, Take 325 mg by mouth daily., Disp: , Rfl:  .  brimonidine (ALPHAGAN) 0.2 % ophthalmic solution, Place 1 drop into both eyes 2 (two) times daily. , Disp: , Rfl:  .  esomeprazole (NEXIUM) 20 MG capsule, TAKE 1 CAPSULE TWICE A DAY, Disp: 180 capsule, Rfl: 2 .  Fluticasone-Salmeterol (ADVAIR DISKUS) 100-50 MCG/DOSE AEPB, Inhale 1 puff into the lungs 2 (two) times daily., Disp: 1 each, Rfl: 1 .  furosemide (LASIX) 20 MG tablet, Take 1 tablet (20 mg total) by mouth daily., Disp: 5 tablet, Rfl: 11 .  latanoprost (XALATAN) 0.005 % ophthalmic solution, Place 1 drop into both eyes at bedtime. , Disp: , Rfl:  .  Polyethylene Glycol 3350 (MIRALAX PO), Take by mouth as needed., Disp: , Rfl:  .  prednisoLONE acetate (PRED FORTE) 1 % ophthalmic suspension, Place 1 drop into the left eye daily. , Disp: , Rfl:  .  simvastatin (ZOCOR) 20 MG tablet, TAKE 1 TABLET DAILY,  Disp: 90 tablet, Rfl: 3 .  Tiotropium Bromide Monohydrate (SPIRIVA RESPIMAT) 2.5 MCG/ACT AERS, Inhale 2 puffs into the lungs daily., Disp: 4 g, Rfl: 5 .  traZODone (DESYREL) 50 MG tablet, Take 0.5-1 tablets (25-50 mg total) by mouth at bedtime as needed for sleep., Disp: 90 tablet, Rfl: 1  Current Facility-Administered Medications:  .  ipratropium-albuterol (DUONEB) 0.5-2.5 (3) MG/3ML nebulizer solution 3 mL, 3 mL, Nebulization, Once, Jerrol Banana., MD  Past Medical History: Past Medical History:  Diagnosis Date  . Abdominal pain    INTERMITTENT  . ARMD (age related macular degeneration)   . Arthritis    hands  . Asthma   . Atherosclerosis of native arteries of the extremities, unspecified   . CAD (coronary artery disease)    3 vessel/ DR FATH CARDIOLOGIST GAVE CLEARANCE  . Colitis    HX OF  . Constipation   . COPD (chronic obstructive pulmonary disease) ([place])   . GERD (gastroesophageal reflux disease)   . Glaucoma    both eyes/ PATIENT CAN NOT SEE  . HOH (hard of hearing)   . Hyperlipidemia   . Hypertension   . Neuromuscular disorder (HCC)    numbness in feet  . Occlusion and stenosis of carotid artery without mention of cerebral infarction   . Prostate cancer ([place])   . Prostate pain   . Shortness of breath dyspnea   . TIA (transient ischemic  attack)    hx of/ 10 yrs ago  . Vertigo   . Wears dentures    upper and lower    Tobacco Use: Social History   Tobacco Use  Smoking Status Former Smoker  . Packs/day: 1.00  . Years: 30.00  . Pack years: 30.00  . Types: Cigarettes  . Last attempt to quit: 02/16/2003  . Years since quitting: 14.2  Smokeless Tobacco Never Used  Tobacco Comment   is exposed to second hand smoke    Labs: Recent Review Flowsheet Data    Labs for ITP Cardiac and Pulmonary Rehab Latest Ref Rng & Units 07/24/2015 05/24/2016 11/09/2016 03/11/2017 05/09/2017   Cholestrol 100 - 199 mg/dL 141 128 - - 134   LDLCALC 0 - 99 mg/dL 82 75 - - 80    HDL >39 mg/dL 38(L) 29(L) - - 36(L)   Trlycerides 0 - 149 mg/dL 107 118 - - 88   Hemoglobin A1c <5.7 % of total Hgb 5.4 5.5 5.5 - -   HCO3 20.0 - 28.0 mmol/L - - - 28.0 -       Pulmonary Assessment Scores: Pulmonary Assessment Scores    Row Name 04/05/17 1146         ADL UCSD   ADL Phase  Entry     SOB Score total  91     Rest  0     Walk  5     Stairs  5     Bath  4     Dress  5     Shop  5       CAT Score   CAT Score  27       mMRC Score   mMRC Score  2        Pulmonary Function Assessment: Pulmonary Function Assessment - 04/05/17 1049      Pulmonary Function Tests   FVC%  68 % date performed 03/22/17    FEV1%  48 %    FEV1/FVC Ratio  54      Breath   Bilateral Breath Sounds  Clear    Shortness of Breath  Yes;Limiting activity       Exercise Target Goals:    Exercise Program Goal: Individual exercise prescription set using results from initial 6 min walk test and THRR while considering  patient's activity barriers and safety.    Exercise Prescription Goal: Initial exercise prescription builds to 30-45 minutes a day of aerobic activity, 2-3 days per week.  Home exercise guidelines will be given to patient during program as part of exercise prescription that the participant will acknowledge.  Activity Barriers & Risk Stratification:   6 Minute Walk: 6 Minute Walk    Row Name 04/05/17 1253         6 Minute Walk   Distance  630 feet     Walk Time  4 minutes     # of Rest Breaks  2     MPH  1.78     METS  2.3     RPE  13     Perceived Dyspnea   3     VO2 Peak  1.99     Symptoms  Yes (comment)     Comments  Short of breath      Resting HR  59 bpm     Resting BP  122/66     Resting Oxygen Saturation   95 %     Exercise Oxygen Saturation  during 6  min walk  88 %     Max Ex. HR  115 bpm     Max Ex. BP  136/68     2 Minute Post BP  126/66       Interval HR   1 Minute HR  90     2 Minute HR  95     4 Minute HR  84     5 Minute HR  115      6 Minute HR  113     2 Minute Post HR  85     Interval Heart Rate?  Yes       Interval Oxygen   Interval Oxygen?  Yes     Baseline Oxygen Saturation %  95 %     1 Minute Oxygen Saturation %  90 %     1 Minute Liters of Oxygen  0 L     2 Minute Oxygen Saturation %  95 %     2 Minute Liters of Oxygen  0 L     3 Minute Oxygen Saturation %  88 %     3 Minute Liters of Oxygen  0 L     4 Minute Oxygen Saturation %  96 %     4 Minute Liters of Oxygen  0 L     5 Minute Oxygen Saturation %  90 %     5 Minute Liters of Oxygen  0 L     6 Minute Oxygen Saturation %  91 %     6 Minute Liters of Oxygen  0 L     2 Minute Post Oxygen Saturation %  97 %     2 Minute Post Liters of Oxygen  0 L       Oxygen Initial Assessment: Oxygen Initial Assessment - 04/05/17 1126      Home Oxygen   Home Oxygen Device  None    Sleep Oxygen Prescription  None    Home Exercise Oxygen Prescription  None    Home at Rest Exercise Oxygen Prescription  None      Initial 6 min Walk   Oxygen Used  None      Program Oxygen Prescription   Program Oxygen Prescription  None      Intervention   Short Term Goals  To learn and demonstrate proper pursed lip breathing techniques or other breathing techniques.;To learn and understand importance of monitoring SPO2 with pulse oximeter and demonstrate accurate use of the pulse oximeter.;To learn and demonstrate proper use of respiratory medications;To learn and understand importance of maintaining oxygen saturations>88%    Long  Term Goals  Verbalizes importance of monitoring SPO2 with pulse oximeter and return demonstration;Maintenance of O2 saturations>88%;Exhibits proper breathing techniques, such as pursed lip breathing or other method taught during program session;Compliance with respiratory medication;Demonstrates proper use of MDI's       Oxygen Re-Evaluation: Oxygen Re-Evaluation    Row Name 05/11/17 1032             Program Oxygen Prescription   Program  Oxygen Prescription  None         Home Oxygen   Home Oxygen Device  None       Sleep Oxygen Prescription  None       Home Exercise Oxygen Prescription  None       Home at Rest Exercise Oxygen Prescription  None         Goals/Expected Outcomes   Short Term Goals  To learn and demonstrate proper pursed lip breathing techniques or other breathing techniques.;To learn and understand importance of monitoring SPO2 with pulse oximeter and demonstrate accurate use of the pulse oximeter.;To learn and demonstrate proper use of respiratory medications;To learn and understand importance of maintaining oxygen saturations>88%       Long  Term Goals  Verbalizes importance of monitoring SPO2 with pulse oximeter and return demonstration;Maintenance of O2 saturations>88%;Exhibits proper breathing techniques, such as pursed lip breathing or other method taught during program session;Compliance with respiratory medication;Demonstrates proper use of MDI's       Comments  Catalino does not check his blood pressure at home or his oxygen. His breathing was hard today and was given an albuterol treatment. He had slight wheezes throughout. He felt better after the breathing treatment and exercise. Informed him to obtain a pulse oximeter and a nebulizer. Infomred him to talk to his doctor about getting a nebulizer.       Goals/Expected Outcomes  Short: get medication for his nebulizer. Long: take medication independently as needed for wheezes if his doctor approves.          Oxygen Discharge (Final Oxygen Re-Evaluation): Oxygen Re-Evaluation - 05/11/17 1032      Program Oxygen Prescription   Program Oxygen Prescription  None      Home Oxygen   Home Oxygen Device  None    Sleep Oxygen Prescription  None    Home Exercise Oxygen Prescription  None    Home at Rest Exercise Oxygen Prescription  None      Goals/Expected Outcomes   Short Term Goals  To learn and demonstrate proper pursed lip breathing techniques or other  breathing techniques.;To learn and understand importance of monitoring SPO2 with pulse oximeter and demonstrate accurate use of the pulse oximeter.;To learn and demonstrate proper use of respiratory medications;To learn and understand importance of maintaining oxygen saturations>88%    Long  Term Goals  Verbalizes importance of monitoring SPO2 with pulse oximeter and return demonstration;Maintenance of O2 saturations>88%;Exhibits proper breathing techniques, such as pursed lip breathing or other method taught during program session;Compliance with respiratory medication;Demonstrates proper use of MDI's    Comments  Jalen does not check his blood pressure at home or his oxygen. His breathing was hard today and was given an albuterol treatment. He had slight wheezes throughout. He felt better after the breathing treatment and exercise. Informed him to obtain a pulse oximeter and a nebulizer. Infomred him to talk to his doctor about getting a nebulizer.    Goals/Expected Outcomes  Short: get medication for his nebulizer. Long: take medication independently as needed for wheezes if his doctor approves.       Initial Exercise Prescription: Initial Exercise Prescription - 04/05/17 1200      Date of Initial Exercise RX and Referring Provider   Date  04/05/17    Referring Provider  Ramachandran      Treadmill   MPH  1    Grade  0    Minutes  15 3/3/3    METs  1.77      NuStep   Level  1    SPM  80    Minutes  15    METs  1.7      Biostep-RELP   Level  1    SPM  50    Minutes  15    METs  2      Prescription Details   Frequency (times per week)  3  Duration  Progress to 45 minutes of aerobic exercise without signs/symptoms of physical distress      Intensity   THRR 40-80% of Max Heartrate  87-116    Ratings of Perceived Exertion  11-13    Perceived Dyspnea  0-4      Progression   Progression  Continue to progress workloads to maintain intensity without signs/symptoms of physical  distress.      Resistance Training   Training Prescription  Yes    Weight  3 lb    Reps  10-15       Perform Capillary Blood Glucose checks as needed.  Exercise Prescription Changes: Exercise Prescription Changes    Row Name 04/27/17 1200 05/11/17 1100           Response to Exercise   Blood Pressure (Admit)  130/56  148/78      Blood Pressure (Exit)  122/64  124/70      Heart Rate (Admit)  58 bpm  61 bpm      Heart Rate (Exercise)  93 bpm  110 bpm      Heart Rate (Exit)  71 bpm  65 bpm      Oxygen Saturation (Admit)  95 %  93 %      Oxygen Saturation (Exercise)  93 %  95 %      Oxygen Saturation (Exit)  96 %  95 %      Rating of Perceived Exertion (Exercise)  17  15      Perceived Dyspnea (Exercise)  3  3      Comments  Davone had to stop and rest on TM  -      Duration  Continue with 45 min of aerobic exercise without signs/symptoms of physical distress.  Continue with 45 min of aerobic exercise without signs/symptoms of physical distress.      Intensity  THRR unchanged  THRR unchanged        Progression   Progression  Continue to progress workloads to maintain intensity without signs/symptoms of physical distress.  Continue to progress workloads to maintain intensity without signs/symptoms of physical distress.      Average METs  1.8  2.4        Resistance Training   Training Prescription  -  Yes      Weight  3 lb  3 lb      Reps  10-15  10-15        Interval Training   Interval Training  -  No        Treadmill   MPH  1  -      Grade  0  -      Minutes  15 7/7  -      METs  1.77  -        NuStep   Level  -  4      SPM  -  68      Minutes  -  15      METs  -  2.7        Biostep-RELP   Level  2  2      SPM  50  45      Minutes  15  15      METs  2  2        Home Exercise Plan   Plans to continue exercise at  -  [place]  1 additional day to program exercise sessions.      Initial Home Exercises Provided  -  05/11/17          Exercise Comments:   Exercise Goals and Review: Exercise Goals    Row Name 04/05/17 1252             Exercise Goals   Increase Physical Activity  Yes       Intervention  Provide advice, education, support and counseling about physical activity/exercise needs.;Develop an individualized exercise prescription for aerobic and resistive training based on initial evaluation findings, risk stratification, comorbidities and participant's personal goals.       Expected Outcomes  Short Term: Attend rehab on a regular basis to increase amount of physical activity.;Long Term: Add in home exercise to make exercise part of routine and to increase amount of physical activity.;Long Term: Exercising regularly at least 3-5 days a week.       Increase Strength and Stamina  Yes       Intervention  Provide advice, education, support and counseling about physical activity/exercise needs.;Develop an individualized exercise prescription for aerobic and resistive training based on initial evaluation findings, risk stratification, comorbidities and participant's personal goals.       Expected Outcomes  Short Term: Increase workloads from initial exercise prescription for resistance, speed, and METs.;Short Term: Perform resistance training exercises routinely during rehab and add in resistance training at home;Long Term: Improve cardiorespiratory fitness, muscular endurance and strength as measured by increased METs and functional capacity (6MWT)       Able to understand and use rate of perceived exertion (RPE) scale  Yes       Intervention  Provide education and explanation on how to use RPE scale       Expected Outcomes  Short Term: Able to use RPE daily in rehab to express subjective intensity level;Long Term:  Able to use RPE to guide intensity level when exercising independently       Able to understand and use Dyspnea scale  Yes       Intervention  Provide education and explanation on how to use Dyspnea scale        Expected Outcomes  Short Term: Able to use Dyspnea scale daily in rehab to express subjective sense of shortness of breath during exertion;Long Term: Able to use Dyspnea scale to guide intensity level when exercising independently       Knowledge and understanding of Target Heart Rate Range (THRR)  Yes       Intervention  Provide education and explanation of THRR including how the numbers were predicted and where they are located for reference       Expected Outcomes  Short Term: Able to state/look up THRR;Long Term: Able to use THRR to govern intensity when exercising independently;Short Term: Able to use daily as guideline for intensity in rehab       Able to check pulse independently  Yes       Intervention  Provide education and demonstration on how to check pulse in carotid and radial arteries.;Review the importance of being able to check your own pulse for safety during independent exercise       Expected Outcomes  Short Term: Able to explain why pulse checking is important during independent exercise;Long Term: Able to check pulse independently and accurately       Understanding of Exercise Prescription  Yes       Intervention  Provide education, explanation, and written materials on patient's  individual exercise prescription       Expected Outcomes  Short Term: Able to explain program exercise prescription;Long Term: Able to explain home exercise prescription to exercise independently          Exercise Goals Re-Evaluation : Exercise Goals Re-Evaluation    Row Name 04/27/17 1208 05/11/17 1153           Exercise Goal Re-Evaluation   Exercise Goals Review  Increase Physical Activity;Increase Strength and Stamina;Able to understand and use rate of perceived exertion (RPE) scale;Able to understand and use Dyspnea scale  Increase Physical Activity;Able to understand and use rate of perceived exertion (RPE) scale;Increase Strength and Stamina;Able to understand and use Dyspnea scale       Comments  Pt is tolerating exercise well - reaching HR and RPE goals.  He continues to build endurance on TM.  Desiderio has a hard time seeing and hearing but his son lives with him and can help read the home exercise packet.  He verbalized understanding.  Staff recommends Financial controller for him due to his limitations with sight.      Expected Outcomes  Short - Pt will continue to attend Long - Pt will walk 15 min without rest on TM  Short - [place] will be more active on days he is not at [place] will continue to be active after finishing LW         Discharge Exercise Prescription (Final Exercise Prescription Changes): Exercise Prescription Changes - 05/11/17 1100      Response to Exercise   Blood Pressure (Admit)  148/78    Blood Pressure (Exit)  124/70    Heart Rate (Admit)  61 bpm    Heart Rate (Exercise)  110 bpm    Heart Rate (Exit)  65 bpm    Oxygen Saturation (Admit)  93 %    Oxygen Saturation (Exercise)  95 %    Oxygen Saturation (Exit)  95 %    Rating of Perceived Exertion (Exercise)  15    Perceived Dyspnea (Exercise)  3    Duration  Continue with 45 min of aerobic exercise without signs/symptoms of physical distress.    Intensity  THRR unchanged      Progression   Progression  Continue to progress workloads to maintain intensity without signs/symptoms of physical distress.    Average METs  2.4      Resistance Training   Training Prescription  Yes    Weight  3 lb    Reps  10-15      Interval Training   Interval Training  No      NuStep   Level  4    SPM  68    Minutes  15    METs  2.7      Biostep-RELP   Level  2    SPM  45    Minutes  15    METs  2      Home Exercise Plan   Plans to continue exercise at  [employer]    Frequency  Add 1 additional day to program exercise sessions.    Initial Home Exercises Provided  05/11/17       Nutrition:  Target Goals: Understanding of nutrition guidelines, daily intake of sodium '1500mg'$ , cholesterol '200mg'$ ,  calories 30% from fat and 7% or less from saturated fats, daily to have 5 or more servings of fruits and vegetables.  Biometrics: Pre Biometrics - 04/05/17 1252  Pre Biometrics   Height  5' 8.25" (1.734 m)    Weight  218 lb 12.8 oz (99.2 kg)    Waist Circumference  44.75 inches    Hip Circumference  46 inches    Waist to Hip Ratio  0.97 %    BMI (Calculated)  33.01        Nutrition Therapy Plan and Nutrition Goals: Nutrition Therapy & Goals - 05/02/17 1213      Nutrition Therapy   Diet  TLC    Protein (specify units)  12oz    Fiber  30 grams    Whole Grain Foods  2 servings    Saturated Fats  15 max. grams    Fruits and Vegetables  4 servings/day 8 ideal    Sodium  1500 grams      Personal Nutrition Goals   Nutrition Goal  Reduce the amount of snacks you consume between meals, particularly in the evenings    Personal Goal #2  Try to include a source of protein at each meal    Personal Goal #3  Look for lower calorie snacks and/or reduce portion sizes of the snacks you currently consume    Comments  Patient eats small meals but snacks heavily througout the day and evening. He is not very active.      Intervention Plan   Intervention  Prescribe, educate and counsel regarding individualized specific dietary modifications aiming towards targeted core components such as weight, hypertension, lipid management, diabetes, heart failure and other comorbidities.;Nutrition handout(s) given to patient. Guidelines for losing weight handout reviewed    Expected Outcomes  Short Term Goal: Understand basic principles of dietary content, such as calories, fat, sodium, cholesterol and nutrients.;Short Term Goal: A plan has been developed with personal nutrition goals set during dietitian appointment.;Long Term Goal: Adherence to prescribed nutrition plan.       Nutrition Assessments: Nutrition Assessments - 04/05/17 1123      MEDFICTS Scores   Pre Score  35       Nutrition Goals  Re-Evaluation: Nutrition Goals Re-Evaluation    [place] Name 05/02/17 1215             Goals   Nutrition Goal  patient will reduce the number of daily calories coming from processed snack foods resulting in weight loss.        Expected Outcome  short term: reduce portion sizes of snacks consumed daily; long term: limit snacking between meals and/or swap snacks for more nutrient-dense options like fruit or yogurt          Nutrition Goals Discharge (Final Nutrition Goals Re-Evaluation): Nutrition Goals Re-Evaluation - 05/02/17 1215      Goals   Nutrition Goal  patient will reduce the number of daily calories coming from processed snack foods resulting in weight loss.     Expected Outcome  short term: reduce portion sizes of snacks consumed daily; long term: limit snacking between meals and/or swap snacks for more nutrient-dense options like fruit or yogurt       Psychosocial: Target Goals: Acknowledge presence or absence of significant depression and/or stress, maximize coping skills, provide positive support system. Participant is able to verbalize types and ability to use techniques and skills needed for reducing stress and depression.   Initial Review & Psychosocial Screening: Initial Psych Review & Screening - 04/05/17 1122      Initial Review   Current issues with  Current Sleep Concerns;Current Stress Concerns    Source of Stress Concerns  Chronic Illness    Comments  it is his breathing that makes him stressed and not able to sleep      Family Dynamics   Good Support System?  Yes    Comments  His daughter and her brother lives with him.      Barriers   Psychosocial barriers to participate in program  The patient should benefit from training in stress management and relaxation.      Screening Interventions   Interventions  Program counselor consult;Provide feedback about the scores to participant;Encouraged to exercise;To provide support and resources with identified  psychosocial needs    Expected Outcomes  Short Term goal: Utilizing psychosocial counselor, staff and physician to assist with identification of specific Stressors or current issues interfering with healing process. Setting desired goal for each stressor or current issue identified.;Long Term Goal: Stressors or current issues are controlled or eliminated.;Short Term goal: Identification and review with participant of any Quality of Life or Depression concerns found by scoring the questionnaire.;Long Term goal: The participant improves quality of Life and PHQ9 Scores as seen by post scores and/or verbalization of changes       Quality of Life Scores:  Scores of 19 and below usually indicate a poorer quality of life in these areas.  A difference of  2-3 points is a clinically meaningful difference.  A difference of 2-3 points in the total score of the Quality of Life Index has been associated with significant improvement in overall quality of life, self-image, physical symptoms, and general health in studies assessing change in quality of life.  PHQ-9: Recent Review Flowsheet Data    Depression screen [facility] 2/9 04/05/2017 04/15/2016 01/08/2015 08/07/2014   Decreased Interest 3 0 0 0   Down, Depressed, Hopeless 0 0 0 0   PHQ - 2 Score 3 0 0 0   Altered sleeping 3 - - -   Tired, decreased energy 3 - - -   Change in appetite 0 - - -   Feeling bad or failure about yourself  0 - - -   Trouble concentrating 0 - - -   Moving slowly or fidgety/restless 0 - - -   Suicidal thoughts 0 - - -   PHQ-9 Score 9 - - -   Difficult doing work/chores Somewhat difficult - - -     Interpretation of Total Score  Total Score Depression Severity:  1-4 = Minimal depression, 5-9 = Mild depression, 10-14 = Moderate depression, 15-19 = Moderately severe depression, 20-27 = Severe depression   Psychosocial Evaluation and Intervention: Psychosocial Evaluation - 04/25/17 1210      Psychosocial Evaluation & Interventions    Interventions  Encouraged to exercise with the program and follow exercise prescription;Relaxation education    Comments  Counselor met with Mr. Griep Delsol) and his daughter [name] today for initial psychosocial evaluation.  He is an 84 year old who was recently diagnosed with COPD.  He has a strong support system with (4) adult children who live close by - including a son who lives in the home with Herbie Baltimore.  He has several other health issues including glaucoma; hearing problems, and a recent issue with his right leg that he plans to call the Dr. about soon.  Timothey reports not sleeping well at all - for a long time - only 2-3 hours and then he is awake for the rest of the night.  He reports having a sleep study years ago and has tried various medications that  do not work.  Veldon has a good appetite and denies a history of depression or anxiety.  He is generally in a positive mood and other than his health, he has minimal stress in his life.  Chesky has goals to have "stronger legs" and Breathe better while in this program.  Counselor encouraged Lucio to check with his Dr. or Pharmacist re: an OTC sleep aid to see if that might be helpful as he has never tried that according to his daughter.  Staff will follow with Herbie Baltimore.    Expected Outcomes  Remington will benefit from consistent exercise to achieve his stated goals.  He will contact the Dr. about his right leg issue and whether he can try an OTC sleep aid to help with his chronic sleep problems.  Staff will follow.    Continue Psychosocial Services   Follow up required by staff       Psychosocial Re-Evaluation: Psychosocial Re-Evaluation    [place] Name 05/11/17 1038             Psychosocial Re-Evaluation   Current issues with  Current Sleep Concerns;Current Stress Concerns       Comments  Langston states he has not slept good in years. He thinks that is it possible his breathing is waking him up at night. Informed him to talk to his doctor about his  sleeping habits. He has tried medications to help him sleep but nothing as worked.        Expected Outcomes  Short: talk to his doctor about his sleep. Long: get better sleep       Interventions  Encouraged to attend Pulmonary Rehabilitation for the exercise       Continue Psychosocial Services   Follow up required by staff          Psychosocial Discharge (Final Psychosocial Re-Evaluation): Psychosocial Re-Evaluation - 05/11/17 1038      Psychosocial Re-Evaluation   Current issues with  Current Sleep Concerns;Current Stress Concerns    Comments  Savion states he has not slept good in years. He thinks that is it possible his breathing is waking him up at night. Informed him to talk to his doctor about his sleeping habits. He has tried medications to help him sleep but nothing as worked.     Expected Outcomes  Short: talk to his doctor about his sleep. Long: get better sleep    Interventions  Encouraged to attend Pulmonary Rehabilitation for the exercise    Continue Psychosocial Services   Follow up required by staff       Education: Education Goals: Education classes will be provided on a weekly basis, covering required topics. Participant will state understanding/return demonstration of topics presented.  Learning Barriers/Preferences: Learning Barriers/Preferences - 04/05/17 1125      Learning Barriers/Preferences   Learning Barriers  Sight    Learning Preferences  None       Education Topics:  Initial Evaluation Education: - Verbal, written and demonstration of respiratory meds, oximetry and breathing techniques. Instruction on use of nebulizers and MDIs and importance of monitoring MDI activations.   Pulmonary Rehab from 05/11/2017 in [facility] Cardiac and Pulmonary Rehab  Date  04/05/17  Educator  [facility]  Instruction Review Code  1- Verbalizes Understanding      General Nutrition Guidelines/Fats and Fiber: -Group instruction provided by verbal, written material, models and  posters to present the general guidelines for heart healthy nutrition. Gives an explanation and review of dietary fats and fiber.  Controlling Sodium/Reading Food Labels: -Group verbal and written material supporting the discussion of sodium use in heart healthy nutrition. Review and explanation with models, verbal and written materials for utilization of the food label.   Exercise Physiology & General Exercise Guidelines: - Group verbal and written instruction with models to review the exercise physiology of the cardiovascular system and associated critical values. Provides general exercise guidelines with specific guidelines to those with heart or lung disease.    Aerobic Exercise & Resistance Training: - Gives group verbal and written instruction on the various components of exercise. Focuses on aerobic and resistive training programs and the benefits of this training and how to safely progress through these programs.   Flexibility, Balance, Mind/Body Relaxation: Provides group verbal/written instruction on the benefits of flexibility and balance training, including mind/body exercise modes such as yoga, pilates and tai chi.  Demonstration and skill practice provided.   Stress and Anxiety: - Provides group verbal and written instruction about the health risks of elevated stress and causes of high stress.  Discuss the correlation between heart/lung disease and anxiety and treatment options. Review healthy ways to manage with stress and anxiety.   Pulmonary Rehab from 05/11/2017 in [facility] Cardiac and Pulmonary Rehab  Date  04/20/17  Educator  [facility]  Instruction Review Code  1- Verbalizes Understanding      Depression: - Provides group verbal and written instruction on the correlation between heart/lung disease and depressed mood, treatment options, and the stigmas associated with seeking treatment.   Exercise & Equipment Safety: - Individual verbal instruction and demonstration of  equipment use and safety with use of the equipment.   Pulmonary Rehab from 05/11/2017 in [facility] Cardiac and Pulmonary Rehab  Date  04/05/17  Educator  [facility]  Instruction Review Code  1- Verbalizes Understanding      Infection Prevention: - Provides verbal and written material to individual with discussion of infection control including proper hand washing and proper equipment cleaning during exercise session.   Pulmonary Rehab from 05/11/2017 in [facility] Cardiac and Pulmonary Rehab  Date  04/05/17  Educator  [facility]  Instruction Review Code  1- Verbalizes Understanding      Falls Prevention: - Provides verbal and written material to individual with discussion of falls prevention and safety.   Pulmonary Rehab from 05/11/2017 in [facility] Cardiac and Pulmonary Rehab  Date  04/05/17  Educator  [facility]  Instruction Review Code  1- Verbalizes Understanding      Diabetes: - Individual verbal and written instruction to review signs/symptoms of diabetes, desired ranges of glucose level fasting, after meals and with exercise. Advice that pre and post exercise glucose checks will be done for 3 sessions at entry of program.   Chronic Lung Diseases: - Group verbal and written instruction to review updates, respiratory medications, advancements in procedures and treatments. Discuss use of supplemental oxygen including available portable oxygen systems, continuous and intermittent flow rates, concentrators, personal use and safety guidelines. Review proper use of inhaler and spacers. Provide informative websites for self-education.    Pulmonary Rehab from 05/11/2017 in [facility] Cardiac and Pulmonary Rehab  Date  05/11/17  Educator  [facility]  Instruction Review Code  1- Verbalizes Understanding      Energy Conservation: - Provide group verbal and written instruction for methods to conserve energy, plan and organize activities. Instruct on pacing techniques, use of adaptive equipment and posture/positioning to relieve shortness  of breath.   Triggers and Exacerbations: - Group verbal and written instruction to review types  of environmental triggers and ways to prevent exacerbations. Discuss weather changes, air quality and the benefits of nasal washing. Review warning signs and symptoms to help prevent infections. Discuss techniques for effective airway clearance, coughing, and vibrations.   AED/CPR: - Group verbal and written instruction with the use of models to demonstrate the basic use of the AED with the basic ABC's of resuscitation.   Pulmonary Rehab from 05/11/2017 in [facility] Cardiac and Pulmonary Rehab  Date  04/15/17  Educator  [facility]  Instruction Review Code  1- Actuary and Physiology of the Lungs: - Group verbal and written instruction with the use of models to provide basic lung anatomy and physiology related to function, structure and complications of lung disease.   Pulmonary Rehab from 05/11/2017 in [facility] Cardiac and Pulmonary Rehab  Date  04/27/17  Educator  [facility]  Instruction Review Code  1- Verbalizes Understanding      Anatomy & Physiology of the Heart: - Group verbal and written instruction and models provide basic cardiac anatomy and physiology, with the coronary electrical and arterial systems. Review of Valvular disease and Heart Failure   Cardiac Medications: - Group verbal and written instruction to review commonly prescribed medications for heart disease. Reviews the medication, class of the drug, and side effects.   Know Your Numbers and Risk Factors: -Group verbal and written instruction about important numbers in your health.  Discussion of what are risk factors and how they play a role in the disease process.  Review of Cholesterol, Blood Pressure, Diabetes, and BMI and the role they play in your overall health.   Sleep Hygiene: -Provides group verbal and written instruction about how sleep can affect your health.  Define sleep hygiene, discuss sleep cycles  and impact of sleep habits. Review good sleep hygiene tips.    Other: -Provides group and verbal instruction on various topics (see comments)    Knowledge Questionnaire Score: Knowledge Questionnaire Score - 04/05/17 1125      Knowledge Questionnaire Score   Pre Score  13/18 reviewed with patient        Core Components/Risk Factors/Patient Goals at Admission: Personal Goals and Risk Factors at Admission - 04/05/17 1127      Core Components/Risk Factors/Patient Goals on Admission    Weight Management  Yes;Weight Loss;Obesity    Intervention  Weight Management: Develop a combined nutrition and exercise program designed to reach desired caloric intake, while maintaining appropriate intake of nutrient and fiber, sodium and fats, and appropriate energy expenditure required for the weight goal.;Weight Management: Provide education and appropriate resources to help participant work on and attain dietary goals.;Weight Management/Obesity: Establish reasonable short term and long term weight goals.;Obesity: Provide education and appropriate resources to help participant work on and attain dietary goals.    Admit Weight  218 lb 12.8 oz (99.2 kg)    Goal Weight: Short Term  210 lb (95.3 kg)    Goal Weight: Long Term  200 lb (90.7 kg)    Expected Outcomes  Short Term: Continue to assess and modify interventions until short term weight is achieved;Long Term: Adherence to nutrition and physical activity/exercise program aimed toward attainment of established weight goal;Weight Maintenance: Understanding of the daily nutrition guidelines, which includes 25-35% calories from fat, 7% or less cal from saturated fats, less than '200mg'$  cholesterol, less than 1.5gm of sodium, & 5 or more servings of fruits and vegetables daily;Weight Loss: Understanding of general recommendations for a balanced deficit meal plan,  which promotes 1-2 lb weight loss per week and includes a negative energy balance of [phone]  kcal/d;Understanding recommendations for meals to include 15-35% energy as protein, 25-35% energy from fat, 35-60% energy from carbohydrates, less than '200mg'$  of dietary cholesterol, 20-35 gm of total fiber daily;Understanding of distribution of calorie intake throughout the day with the consumption of 4-5 meals/snacks    Improve shortness of breath with ADL's  Yes    Intervention  Provide education, individualized exercise plan and daily activity instruction to help decrease symptoms of SOB with activities of daily living.    Expected Outcomes  Short Term: Improve cardiorespiratory fitness to achieve a reduction of symptoms when performing ADLs;Long Term: Be able to perform more ADLs without symptoms or delay the onset of symptoms    Heart Failure  Yes    Intervention  Provide a combined exercise and nutrition program that is supplemented with education, support and counseling about heart failure. Directed toward relieving symptoms such as shortness of breath, decreased exercise tolerance, and extremity edema.    Expected Outcomes  Improve functional capacity of life;Short term: Daily weights obtained and reported for increase. Utilizing diuretic protocols set by physician.;Short term: Attendance in program 2-3 days a week with increased exercise capacity. Reported lower sodium intake. Reported increased fruit and vegetable intake. Reports medication compliance.;Long term: Adoption of self-care skills and reduction of barriers for early signs and symptoms recognition and intervention leading to self-care maintenance.    Hypertension  Yes    Intervention  Provide education on lifestyle modifcations including regular physical activity/exercise, weight management, moderate sodium restriction and increased consumption of fresh fruit, vegetables, and low fat dairy, alcohol moderation, and smoking cessation.;Monitor prescription use compliance.    Expected Outcomes  Short Term: Continued assessment and  intervention until BP is < 140/59m HG in hypertensive participants. < 130/855mHG in hypertensive participants with diabetes, heart failure or chronic kidney disease.;Long Term: Maintenance of blood pressure at goal levels.    Lipids  Yes takes medication    Intervention  Provide education and support for participant on nutrition & aerobic/resistive exercise along with prescribed medications to achieve LDL '70mg'$ , HDL >'40mg'$ .    Expected Outcomes  Short Term: Participant states understanding of desired cholesterol values and is compliant with medications prescribed. Participant is following exercise prescription and nutrition guidelines.;Long Term: Cholesterol controlled with medications as prescribed, with individualized exercise RX and with personalized nutrition plan. Value goals: LDL < '70mg'$ , HDL > 40 mg.       Core Components/Risk Factors/Patient Goals Review:  Goals and Risk Factor Review    Row Name 05/11/17 1027             Core Components/Risk Factors/Patient Goals Review   Personal Goals Review  Weight Management/Obesity;Improve shortness of breath with ADL's;Lipids;Hypertension;Heart Failure       Review  RoLawtated that he has had some hip pain recently. RoAvenas missed a few days and feels like his breathing has not improved. His blood pressure has been a little elevated. He states that his cholestorol from his doctor was ok. He takes his cholesterol medication everyday.       Expected Outcomes  Short: continue to excerice to reduce Blood pressure. Long: Decrease blood pressure within normal ranges.          Core Components/Risk Factors/Patient Goals at Discharge (Final Review):  Goals and Risk Factor Review - 05/11/17 1027      Core Components/Risk Factors/Patient Goals Review   Personal Goals Review  Weight Management/Obesity;Improve shortness of breath with ADL's;Lipids;Hypertension;Heart Failure    Review  Deavon stated that he has had some hip pain recently. Savalas has  missed a few days and feels like his breathing has not improved. His blood pressure has been a little elevated. He states that his cholestorol from his doctor was ok. He takes his cholesterol medication everyday.    Expected Outcomes  Short: continue to excerice to reduce Blood pressure. Long: Decrease blood pressure within normal ranges.       ITP Comments: ITP Comments    Row Name 04/05/17 1048 04/25/17 0821 05/23/17 0825       ITP Comments  Medical Evaluation completed. Chart sent for review and changes to Dr. Emily Filbert Director of [place]. Diagnosis can be found in [facility] encounter 03/16/17  30 day review completed. ITP sent to Dr. Emily Filbert Director of [place]. Continue with ITP unless changes are made by physician.   30 day review completed. ITP sent to Dr. Emily Filbert Director of [place]. Continue with ITP unless changes are made by physician        Comments: 30 day review

## 2017-05-25 ENCOUNTER — Telehealth: Payer: Self-pay

## 2017-05-25 NOTE — Telephone Encounter (Signed)
No answer

## 2017-05-27 ENCOUNTER — Telehealth: Payer: Self-pay | Admitting: *Deleted

## 2017-05-27 ENCOUNTER — Encounter: Payer: Self-pay | Admitting: *Deleted

## 2017-05-27 NOTE — Telephone Encounter (Signed)
Johnathan [name], [name]'s daughter, called staff today to inform staff that he is still experiencing pain in his leg. He is working with his doctors to help resolve this pain and hopes to be back in class soon.

## 2017-06-06 ENCOUNTER — Telehealth: Payer: Self-pay

## 2017-06-06 DIAGNOSIS — J449 Chronic obstructive pulmonary disease, unspecified: Secondary | ICD-10-CM

## 2017-06-06 NOTE — Progress Notes (Signed)
Pulmonary Individual Treatment Plan  Patient Details  Name: Johnathan [name] MRN: [id number] Date of Birth: 09-Oct-1925 Referring Provider:     Pulmonary Rehab from 04/05/2017 in [facility] Cardiac and Pulmonary Rehab  Referring Provider  Ramachandran      Initial Encounter Date:    Pulmonary Rehab from 04/05/2017 in [facility] Cardiac and Pulmonary Rehab  Date  04/05/17  Referring Provider  Ashby Dawes      Visit Diagnosis: COPD with chronic bronchitis and emphysema ([place])  Patient's Home Medications on Admission:  Current Outpatient Medications:  .  albuterol (PROVENTIL HFA;VENTOLIN HFA) 108 (90 Base) MCG/ACT inhaler, Inhale 2 puffs into the lungs every 6 (six) hours as needed for wheezing or shortness of breath., Disp: 1 Inhaler, Rfl: 1 .  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  amLODipine (NORVASC) 5 MG tablet, Take 1 tablet (5 mg total) by mouth daily., Disp: 90 tablet, Rfl: 3 .  amLODipine (NORVASC) 5 MG tablet, TAKE 1 TABLET DAILY, Disp: 90 tablet, Rfl: 3 .  aspirin 325 MG EC tablet, Take 325 mg by mouth daily., Disp: , Rfl:  .  brimonidine (ALPHAGAN) 0.2 % ophthalmic solution, Place 1 drop into both eyes 2 (two) times daily. , Disp: , Rfl:  .  esomeprazole (NEXIUM) 20 MG capsule, TAKE 1 CAPSULE TWICE A DAY, Disp: 180 capsule, Rfl: 2 .  Fluticasone-Salmeterol (ADVAIR DISKUS) 100-50 MCG/DOSE AEPB, Inhale 1 puff into the lungs 2 (two) times daily., Disp: 1 each, Rfl: 1 .  furosemide (LASIX) 20 MG tablet, Take 1 tablet (20 mg total) by mouth daily., Disp: 5 tablet, Rfl: 11 .  latanoprost (XALATAN) 0.005 % ophthalmic solution, Place 1 drop into both eyes at bedtime. , Disp: , Rfl:  .  Polyethylene Glycol 3350 (MIRALAX PO), Take by mouth as needed., Disp: , Rfl:  .  prednisoLONE acetate (PRED FORTE) 1 % ophthalmic suspension, Place 1 drop into the left eye daily. , Disp: , Rfl:  .  simvastatin (ZOCOR) 20 MG tablet, TAKE 1 TABLET DAILY,  Disp: 90 tablet, Rfl: 3 .  Tiotropium Bromide Monohydrate (SPIRIVA RESPIMAT) 2.5 MCG/ACT AERS, Inhale 2 puffs into the lungs daily., Disp: 4 g, Rfl: 5 .  traZODone (DESYREL) 50 MG tablet, Take 0.5-1 tablets (25-50 mg total) by mouth at bedtime as needed for sleep., Disp: 90 tablet, Rfl: 1  Current Facility-Administered Medications:  .  ipratropium-albuterol (DUONEB) 0.5-2.5 (3) MG/3ML nebulizer solution 3 mL, 3 mL, Nebulization, Once, Jerrol Banana., MD  Past Medical History: Past Medical History:  Diagnosis Date  . Abdominal pain    INTERMITTENT  . ARMD (age related macular degeneration)   . Arthritis    hands  . Asthma   . Atherosclerosis of native arteries of the extremities, unspecified   . CAD (coronary artery disease)    3 vessel/ DR FATH CARDIOLOGIST GAVE CLEARANCE  . Colitis    HX OF  . Constipation   . COPD (chronic obstructive pulmonary disease) ([place])   . GERD (gastroesophageal reflux disease)   . Glaucoma    both eyes/ PATIENT CAN NOT SEE  . HOH (hard of hearing)   . Hyperlipidemia   . Hypertension   . Neuromuscular disorder (HCC)    numbness in feet  . Occlusion and stenosis of carotid artery without mention of cerebral infarction   . Prostate cancer ([place])   . Prostate pain   . Shortness of breath dyspnea   . TIA (transient ischemic  attack)    hx of/ 10 yrs ago  . Vertigo   . Wears dentures    upper and lower    Tobacco Use: Social History   Tobacco Use  Smoking Status Former Smoker  . Packs/day: 1.00  . Years: 30.00  . Pack years: 30.00  . Types: Cigarettes  . Last attempt to quit: 02/16/2003  . Years since quitting: 14.3  Smokeless Tobacco Never Used  Tobacco Comment   is exposed to second hand smoke    Labs: Recent Review Flowsheet Data    Labs for ITP Cardiac and Pulmonary Rehab Latest Ref Rng & Units 07/24/2015 05/24/2016 11/09/2016 03/11/2017 05/09/2017   Cholestrol 100 - 199 mg/dL 141 128 - - 134   LDLCALC 0 - 99 mg/dL 82 75 - - 80    HDL >39 mg/dL 38(L) 29(L) - - 36(L)   Trlycerides 0 - 149 mg/dL 107 118 - - 88   Hemoglobin A1c <5.7 % of total Hgb 5.4 5.5 5.5 - -   HCO3 20.0 - 28.0 mmol/L - - - 28.0 -       Pulmonary Assessment Scores: Pulmonary Assessment Scores    Row Name 04/05/17 1146         ADL UCSD   ADL Phase  Entry     SOB Score total  91     Rest  0     Walk  5     Stairs  5     Bath  4     Dress  5     Shop  5       CAT Score   CAT Score  27       mMRC Score   mMRC Score  2        Pulmonary Function Assessment: Pulmonary Function Assessment - 04/05/17 1049      Pulmonary Function Tests   FVC%  68 % date performed 03/22/17    FEV1%  48 %    FEV1/FVC Ratio  54      Breath   Bilateral Breath Sounds  Clear    Shortness of Breath  Yes;Limiting activity       Exercise Target Goals:    Exercise Program Goal: Individual exercise prescription set using results from initial 6 min walk test and THRR while considering  patient's activity barriers and safety.    Exercise Prescription Goal: Initial exercise prescription builds to 30-45 minutes a day of aerobic activity, 2-3 days per week.  Home exercise guidelines will be given to patient during program as part of exercise prescription that the participant will acknowledge.  Activity Barriers & Risk Stratification:   6 Minute Walk: 6 Minute Walk    Row Name 04/05/17 1253         6 Minute Walk   Distance  630 feet     Walk Time  4 minutes     # of Rest Breaks  2     MPH  1.78     METS  2.3     RPE  13     Perceived Dyspnea   3     VO2 Peak  1.99     Symptoms  Yes (comment)     Comments  Short of breath      Resting HR  59 bpm     Resting BP  122/66     Resting Oxygen Saturation   95 %     Exercise Oxygen Saturation  during 6  min walk  88 %     Max Ex. HR  115 bpm     Max Ex. BP  136/68     2 Minute Post BP  126/66       Interval HR   1 Minute HR  90     2 Minute HR  95     4 Minute HR  84     5 Minute HR  115      6 Minute HR  113     2 Minute Post HR  85     Interval Heart Rate?  Yes       Interval Oxygen   Interval Oxygen?  Yes     Baseline Oxygen Saturation %  95 %     1 Minute Oxygen Saturation %  90 %     1 Minute Liters of Oxygen  0 L     2 Minute Oxygen Saturation %  95 %     2 Minute Liters of Oxygen  0 L     3 Minute Oxygen Saturation %  88 %     3 Minute Liters of Oxygen  0 L     4 Minute Oxygen Saturation %  96 %     4 Minute Liters of Oxygen  0 L     5 Minute Oxygen Saturation %  90 %     5 Minute Liters of Oxygen  0 L     6 Minute Oxygen Saturation %  91 %     6 Minute Liters of Oxygen  0 L     2 Minute Post Oxygen Saturation %  97 %     2 Minute Post Liters of Oxygen  0 L       Oxygen Initial Assessment: Oxygen Initial Assessment - 04/05/17 1126      Home Oxygen   Home Oxygen Device  None    Sleep Oxygen Prescription  None    Home Exercise Oxygen Prescription  None    Home at Rest Exercise Oxygen Prescription  None      Initial 6 min Walk   Oxygen Used  None      Program Oxygen Prescription   Program Oxygen Prescription  None      Intervention   Short Term Goals  To learn and demonstrate proper pursed lip breathing techniques or other breathing techniques.;To learn and understand importance of monitoring SPO2 with pulse oximeter and demonstrate accurate use of the pulse oximeter.;To learn and demonstrate proper use of respiratory medications;To learn and understand importance of maintaining oxygen saturations>88%    Long  Term Goals  Verbalizes importance of monitoring SPO2 with pulse oximeter and return demonstration;Maintenance of O2 saturations>88%;Exhibits proper breathing techniques, such as pursed lip breathing or other method taught during program session;Compliance with respiratory medication;Demonstrates proper use of MDI's       Oxygen Re-Evaluation: Oxygen Re-Evaluation    Row Name 05/11/17 1032             Program Oxygen Prescription   Program  Oxygen Prescription  None         Home Oxygen   Home Oxygen Device  None       Sleep Oxygen Prescription  None       Home Exercise Oxygen Prescription  None       Home at Rest Exercise Oxygen Prescription  None         Goals/Expected Outcomes   Short Term Goals  To learn and demonstrate proper pursed lip breathing techniques or other breathing techniques.;To learn and understand importance of monitoring SPO2 with pulse oximeter and demonstrate accurate use of the pulse oximeter.;To learn and demonstrate proper use of respiratory medications;To learn and understand importance of maintaining oxygen saturations>88%       Long  Term Goals  Verbalizes importance of monitoring SPO2 with pulse oximeter and return demonstration;Maintenance of O2 saturations>88%;Exhibits proper breathing techniques, such as pursed lip breathing or other method taught during program session;Compliance with respiratory medication;Demonstrates proper use of MDI's       Comments  Karel does not check his blood pressure at home or his oxygen. His breathing was hard today and was given an albuterol treatment. He had slight wheezes throughout. He felt better after the breathing treatment and exercise. Informed him to obtain a pulse oximeter and a nebulizer. Infomred him to talk to his doctor about getting a nebulizer.       Goals/Expected Outcomes  Short: get medication for his nebulizer. Long: take medication independently as needed for wheezes if his doctor approves.          Oxygen Discharge (Final Oxygen Re-Evaluation): Oxygen Re-Evaluation - 05/11/17 1032      Program Oxygen Prescription   Program Oxygen Prescription  None      Home Oxygen   Home Oxygen Device  None    Sleep Oxygen Prescription  None    Home Exercise Oxygen Prescription  None    Home at Rest Exercise Oxygen Prescription  None      Goals/Expected Outcomes   Short Term Goals  To learn and demonstrate proper pursed lip breathing techniques or other  breathing techniques.;To learn and understand importance of monitoring SPO2 with pulse oximeter and demonstrate accurate use of the pulse oximeter.;To learn and demonstrate proper use of respiratory medications;To learn and understand importance of maintaining oxygen saturations>88%    Long  Term Goals  Verbalizes importance of monitoring SPO2 with pulse oximeter and return demonstration;Maintenance of O2 saturations>88%;Exhibits proper breathing techniques, such as pursed lip breathing or other method taught during program session;Compliance with respiratory medication;Demonstrates proper use of MDI's    Comments  Elford does not check his blood pressure at home or his oxygen. His breathing was hard today and was given an albuterol treatment. He had slight wheezes throughout. He felt better after the breathing treatment and exercise. Informed him to obtain a pulse oximeter and a nebulizer. Infomred him to talk to his doctor about getting a nebulizer.    Goals/Expected Outcomes  Short: get medication for his nebulizer. Long: take medication independently as needed for wheezes if his doctor approves.       Initial Exercise Prescription: Initial Exercise Prescription - 04/05/17 1200      Date of Initial Exercise RX and Referring Provider   Date  04/05/17    Referring Provider  Ramachandran      Treadmill   MPH  1    Grade  0    Minutes  15 3/3/3    METs  1.77      NuStep   Level  1    SPM  80    Minutes  15    METs  1.7      Biostep-RELP   Level  1    SPM  50    Minutes  15    METs  2      Prescription Details   Frequency (times per week)  3  Duration  Progress to 45 minutes of aerobic exercise without signs/symptoms of physical distress      Intensity   THRR 40-80% of Max Heartrate  87-116    Ratings of Perceived Exertion  11-13    Perceived Dyspnea  0-4      Progression   Progression  Continue to progress workloads to maintain intensity without signs/symptoms of physical  distress.      Resistance Training   Training Prescription  Yes    Weight  3 lb    Reps  10-15       Perform Capillary Blood Glucose checks as needed.  Exercise Prescription Changes: Exercise Prescription Changes    Row Name 04/27/17 1200 05/11/17 1100 05/25/17 1300         Response to Exercise   Blood Pressure (Admit)  130/56  148/78  130/72     Blood Pressure (Exit)  122/64  124/70  148/78     Heart Rate (Admit)  58 bpm  61 bpm  69 bpm     Heart Rate (Exercise)  93 bpm  110 bpm  100 bpm     Heart Rate (Exit)  71 bpm  65 bpm  60 bpm     Oxygen Saturation (Admit)  95 %  93 %  94 %     Oxygen Saturation (Exercise)  93 %  95 %  93 %     Oxygen Saturation (Exit)  96 %  95 %  95 %     Rating of Perceived Exertion (Exercise)  '17  15  14     '$ Perceived Dyspnea (Exercise)  '3  3  3     '$ Comments  Dominik had to stop and rest on TM  -  -     Duration  Continue with 45 min of aerobic exercise without signs/symptoms of physical distress.  Continue with 45 min of aerobic exercise without signs/symptoms of physical distress.  Continue with 45 min of aerobic exercise without signs/symptoms of physical distress.     Intensity  THRR unchanged  THRR unchanged  THRR unchanged       Progression   Progression  Continue to progress workloads to maintain intensity without signs/symptoms of physical distress.  Continue to progress workloads to maintain intensity without signs/symptoms of physical distress.  Continue to progress workloads to maintain intensity without signs/symptoms of physical distress.     Average METs  1.8  2.4  2       Resistance Training   Training Prescription  -  Yes  Yes     Weight  3 lb  3 lb  3 lb     Reps  10-15  10-15  10-15       Interval Training   Interval Training  -  No  No       Treadmill   MPH  1  -  -     Grade  0  -  -     Minutes  15 7/7  -  -     METs  1.77  -  -       NuStep   Level  -  4  4     SPM  -  68  82     Minutes  -  15  15     METs  -  2.7   2.2       Biostep-RELP   Level  2  2  3  SPM  50  45  -     Minutes  '15  15  15     '$ METs  '2  2  2       '$ Home Exercise Plan   Plans to continue exercise at  -  [place] 1 additional day to program exercise sessions.  Add 1 additional day to program exercise sessions.     Initial Home Exercises Provided  -  05/11/17  05/11/17        Exercise Comments:   Exercise Goals and Review: Exercise Goals    Row Name 04/05/17 1252             Exercise Goals   Increase Physical Activity  Yes       Intervention  Provide advice, education, support and counseling about physical activity/exercise needs.;Develop an individualized exercise prescription for aerobic and resistive training based on initial evaluation findings, risk stratification, comorbidities and participant's personal goals.       Expected Outcomes  Short Term: Attend rehab on a regular basis to increase amount of physical activity.;Long Term: Add in home exercise to make exercise part of routine and to increase amount of physical activity.;Long Term: Exercising regularly at least 3-5 days a week.       Increase Strength and Stamina  Yes       Intervention  Provide advice, education, support and counseling about physical activity/exercise needs.;Develop an individualized exercise prescription for aerobic and resistive training based on initial evaluation findings, risk stratification, comorbidities and participant's personal goals.       Expected Outcomes  Short Term: Increase workloads from initial exercise prescription for resistance, speed, and METs.;Short Term: Perform resistance training exercises routinely during rehab and add in resistance training at home;Long Term: Improve cardiorespiratory fitness, muscular endurance and strength as measured by increased METs and functional capacity (6MWT)       Able to understand and use rate of perceived exertion (RPE) scale  Yes       Intervention   Provide education and explanation on how to use RPE scale       Expected Outcomes  Short Term: Able to use RPE daily in rehab to express subjective intensity level;Long Term:  Able to use RPE to guide intensity level when exercising independently       Able to understand and use Dyspnea scale  Yes       Intervention  Provide education and explanation on how to use Dyspnea scale       Expected Outcomes  Short Term: Able to use Dyspnea scale daily in rehab to express subjective sense of shortness of breath during exertion;Long Term: Able to use Dyspnea scale to guide intensity level when exercising independently       Knowledge and understanding of Target Heart Rate Range (THRR)  Yes       Intervention  Provide education and explanation of THRR including how the numbers were predicted and where they are located for reference       Expected Outcomes  Short Term: Able to state/look up THRR;Long Term: Able to use THRR to govern intensity when exercising independently;Short Term: Able to use daily as guideline for intensity in rehab       Able to check pulse independently  Yes       Intervention  Provide education and demonstration on how to check pulse in carotid and radial arteries.;Review the importance of  being able to check your own pulse for safety during independent exercise       Expected Outcomes  Short Term: Able to explain why pulse checking is important during independent exercise;Long Term: Able to check pulse independently and accurately       Understanding of Exercise Prescription  Yes       Intervention  Provide education, explanation, and written materials on patient's individual exercise prescription       Expected Outcomes  Short Term: Able to explain program exercise prescription;Long Term: Able to explain home exercise prescription to exercise independently          Exercise Goals Re-Evaluation : Exercise Goals Re-Evaluation    Row Name 04/27/17 1208 05/11/17 1153            Exercise Goal Re-Evaluation   Exercise Goals Review  Increase Physical Activity;Increase Strength and Stamina;Able to understand and use rate of perceived exertion (RPE) scale;Able to understand and use Dyspnea scale  Increase Physical Activity;Able to understand and use rate of perceived exertion (RPE) scale;Increase Strength and Stamina;Able to understand and use Dyspnea scale      Comments  Pt is tolerating exercise well - reaching HR and RPE goals.  He continues to build endurance on TM.  Haaris has a hard time seeing and hearing but his son lives with him and can help read the home exercise packet.  He verbalized understanding.  Staff recommends Financial controller for him due to his limitations with sight.      Expected Outcomes  Short - Pt will continue to attend Long - Pt will walk 15 min without rest on TM  Short - Akari will be more active on days he is not at [place] will continue to be active after finishing LW         Discharge Exercise Prescription (Final Exercise Prescription Changes): Exercise Prescription Changes - 05/25/17 1300      Response to Exercise   Blood Pressure (Admit)  130/72    Blood Pressure (Exit)  148/78    Heart Rate (Admit)  69 bpm    Heart Rate (Exercise)  100 bpm    Heart Rate (Exit)  60 bpm    Oxygen Saturation (Admit)  94 %    Oxygen Saturation (Exercise)  93 %    Oxygen Saturation (Exit)  95 %    Rating of Perceived Exertion (Exercise)  14    Perceived Dyspnea (Exercise)  3    Duration  Continue with 45 min of aerobic exercise without signs/symptoms of physical distress.    Intensity  THRR unchanged      Progression   Progression  Continue to progress workloads to maintain intensity without signs/symptoms of physical distress.    Average METs  2      Resistance Training   Training Prescription  Yes    Weight  3 lb    Reps  10-15      Interval Training   Interval Training  No      NuStep   Level  4    SPM  82    Minutes  15    METs  2.2       Biostep-RELP   Level  3    Minutes  15    METs  2      Home Exercise Plan   Plans to continue exercise at  [employer]    Frequency  Add 1 additional day to program exercise sessions.  Initial Home Exercises Provided  05/11/17       Nutrition:  Target Goals: Understanding of nutrition guidelines, daily intake of sodium '1500mg'$ , cholesterol '200mg'$ , calories 30% from fat and 7% or less from saturated fats, daily to have 5 or more servings of fruits and vegetables.  Biometrics: Pre Biometrics - 04/05/17 1252      Pre Biometrics   Height  5' 8.25" (1.734 m)    Weight  218 lb 12.8 oz (99.2 kg)    Waist Circumference  44.75 inches    Hip Circumference  46 inches    Waist to Hip Ratio  0.97 %    BMI (Calculated)  33.01        Nutrition Therapy Plan and Nutrition Goals: Nutrition Therapy & Goals - 05/02/17 1213      Nutrition Therapy   Diet  TLC    Protein (specify units)  12oz    Fiber  30 grams    Whole Grain Foods  2 servings    Saturated Fats  15 max. grams    Fruits and Vegetables  4 servings/day 8 ideal    Sodium  1500 grams      Personal Nutrition Goals   Nutrition Goal  Reduce the amount of snacks you consume between meals, particularly in the evenings    Personal Goal #2  Try to include a source of protein at each meal    Personal Goal #3  Look for lower calorie snacks and/or reduce portion sizes of the snacks you currently consume    Comments  Patient eats small meals but snacks heavily througout the day and evening. He is not very active.      Intervention Plan   Intervention  Prescribe, educate and counsel regarding individualized specific dietary modifications aiming towards targeted core components such as weight, hypertension, lipid management, diabetes, heart failure and other comorbidities.;Nutrition handout(s) given to patient. Guidelines for losing weight handout reviewed    Expected Outcomes  Short Term Goal: Understand basic principles of dietary  content, such as calories, fat, sodium, cholesterol and nutrients.;Short Term Goal: A plan has been developed with personal nutrition goals set during dietitian appointment.;Long Term Goal: Adherence to prescribed nutrition plan.       Nutrition Assessments: Nutrition Assessments - 04/05/17 1123      MEDFICTS Scores   Pre Score  35       Nutrition Goals Re-Evaluation: Nutrition Goals Re-Evaluation    [place] Name 05/02/17 1215             Goals   Nutrition Goal  patient will reduce the number of daily calories coming from processed snack foods resulting in weight loss.        Expected Outcome  short term: reduce portion sizes of snacks consumed daily; long term: limit snacking between meals and/or swap snacks for more nutrient-dense options like fruit or yogurt          Nutrition Goals Discharge (Final Nutrition Goals Re-Evaluation): Nutrition Goals Re-Evaluation - 05/02/17 1215      Goals   Nutrition Goal  patient will reduce the number of daily calories coming from processed snack foods resulting in weight loss.     Expected Outcome  short term: reduce portion sizes of snacks consumed daily; long term: limit snacking between meals and/or swap snacks for more nutrient-dense options like fruit or yogurt       Psychosocial: Target Goals: Acknowledge presence or absence of significant depression and/or stress, maximize coping skills, provide positive  support system. Participant is able to verbalize types and ability to use techniques and skills needed for reducing stress and depression.   Initial Review & Psychosocial Screening: Initial Psych Review & Screening - 04/05/17 1122      Initial Review   Current issues with  Current Sleep Concerns;Current Stress Concerns    Source of Stress Concerns  Chronic Illness    Comments  it is his breathing that makes him stressed and not able to sleep      Family Dynamics   Good Support System?  Yes    Comments  His daughter and her  brother lives with him.      Barriers   Psychosocial barriers to participate in program  The patient should benefit from training in stress management and relaxation.      Screening Interventions   Interventions  Program counselor consult;Provide feedback about the scores to participant;Encouraged to exercise;To provide support and resources with identified psychosocial needs    Expected Outcomes  Short Term goal: Utilizing psychosocial counselor, staff and physician to assist with identification of specific Stressors or current issues interfering with healing process. Setting desired goal for each stressor or current issue identified.;Long Term Goal: Stressors or current issues are controlled or eliminated.;Short Term goal: Identification and review with participant of any Quality of Life or Depression concerns found by scoring the questionnaire.;Long Term goal: The participant improves quality of Life and PHQ9 Scores as seen by post scores and/or verbalization of changes       Quality of Life Scores:  Scores of 19 and below usually indicate a poorer quality of life in these areas.  A difference of  2-3 points is a clinically meaningful difference.  A difference of 2-3 points in the total score of the Quality of Life Index has been associated with significant improvement in overall quality of life, self-image, physical symptoms, and general health in studies assessing change in quality of life.  PHQ-9: Recent Review Flowsheet Data    Depression screen [facility] 2/9 04/05/2017 04/15/2016 01/08/2015 08/07/2014   Decreased Interest 3 0 0 0   Down, Depressed, Hopeless 0 0 0 0   PHQ - 2 Score 3 0 0 0   Altered sleeping 3 - - -   Tired, decreased energy 3 - - -   Change in appetite 0 - - -   Feeling bad or failure about yourself  0 - - -   Trouble concentrating 0 - - -   Moving slowly or fidgety/restless 0 - - -   Suicidal thoughts 0 - - -   PHQ-9 Score 9 - - -   Difficult doing work/chores Somewhat  difficult - - -     Interpretation of Total Score  Total Score Depression Severity:  1-4 = Minimal depression, 5-9 = Mild depression, 10-14 = Moderate depression, 15-19 = Moderately severe depression, 20-27 = Severe depression   Psychosocial Evaluation and Intervention: Psychosocial Evaluation - 04/25/17 1210      Psychosocial Evaluation & Interventions   Interventions  Encouraged to exercise with the program and follow exercise prescription;Relaxation education    Comments  Counselor met with Mr. Spease Bussey) and his daughter [name] today for initial psychosocial evaluation.  He is an 84 year old who was recently diagnosed with COPD.  He has a strong support system with (4) adult children who live close by - including a son who lives in the home with Herbie Baltimore.  He has several other health issues including glaucoma;  hearing problems, and a recent issue with his right leg that he plans to call the Dr. about soon.  Cervando reports not sleeping well at all - for a long time - only 2-3 hours and then he is awake for the rest of the night.  He reports having a sleep study years ago and has tried various medications that do not work.  Sailor has a good appetite and denies a history of depression or anxiety.  He is generally in a positive mood and other than his health, he has minimal stress in his life.  Lynden has goals to have "stronger legs" and Breathe better while in this program.  Counselor encouraged Albin to check with his Dr. or Pharmacist re: an OTC sleep aid to see if that might be helpful as he has never tried that according to his daughter.  Staff will follow with Herbie Baltimore.    Expected Outcomes  Mcadoo will benefit from consistent exercise to achieve his stated goals.  He will contact the Dr. about his right leg issue and whether he can try an OTC sleep aid to help with his chronic sleep problems.  Staff will follow.    Continue Psychosocial Services   Follow up required by staff        Psychosocial Re-Evaluation: Psychosocial Re-Evaluation    [place] Name 05/11/17 1038             Psychosocial Re-Evaluation   Current issues with  Current Sleep Concerns;Current Stress Concerns       Comments  Dontell states he has not slept good in years. He thinks that is it possible his breathing is waking him up at night. Informed him to talk to his doctor about his sleeping habits. He has tried medications to help him sleep but nothing as worked.        Expected Outcomes  Short: talk to his doctor about his sleep. Long: get better sleep       Interventions  Encouraged to attend Pulmonary Rehabilitation for the exercise       Continue Psychosocial Services   Follow up required by staff          Psychosocial Discharge (Final Psychosocial Re-Evaluation): Psychosocial Re-Evaluation - 05/11/17 1038      Psychosocial Re-Evaluation   Current issues with  Current Sleep Concerns;Current Stress Concerns    Comments  Mohd states he has not slept good in years. He thinks that is it possible his breathing is waking him up at night. Informed him to talk to his doctor about his sleeping habits. He has tried medications to help him sleep but nothing as worked.     Expected Outcomes  Short: talk to his doctor about his sleep. Long: get better sleep    Interventions  Encouraged to attend Pulmonary Rehabilitation for the exercise    Continue Psychosocial Services   Follow up required by staff       Education: Education Goals: Education classes will be provided on a weekly basis, covering required topics. Participant will state understanding/return demonstration of topics presented.  Learning Barriers/Preferences: Learning Barriers/Preferences - 04/05/17 1125      Learning Barriers/Preferences   Learning Barriers  Sight    Learning Preferences  None       Education Topics:  Initial Evaluation Education: - Verbal, written and demonstration of respiratory meds, oximetry and breathing  techniques. Instruction on use of nebulizers and MDIs and importance of monitoring MDI activations.   Pulmonary Rehab from 05/11/2017 in  [place] [facility]  Date  04/05/17  Educator  [facility]  Instruction Review Code  1- Verbalizes Understanding      General Nutrition Guidelines/Fats and Fiber: -Group instruction provided by verbal, written material, models and posters to present the general guidelines for heart healthy nutrition. Gives an explanation and review of dietary fats and fiber.   Controlling Sodium/Reading Food Labels: -Group verbal and written material supporting the discussion of sodium use in heart healthy nutrition. Review and explanation with models, verbal and written materials for utilization of the food label.   Exercise Physiology & General Exercise Guidelines: - Group verbal and written instruction with models to review the exercise physiology of the cardiovascular system and associated critical values. Provides general exercise guidelines with specific guidelines to those with heart or lung disease.    Aerobic Exercise & Resistance Training: - Gives group verbal and written instruction on the various components of exercise. Focuses on aerobic and resistive training programs and the benefits of this training and how to safely progress through these programs.   Flexibility, Balance, Mind/Body Relaxation: Provides group verbal/written instruction on the benefits of flexibility and balance training, including mind/body exercise modes such as yoga, pilates and tai chi.  Demonstration and skill practice provided.   Stress and Anxiety: - Provides group verbal and written instruction about the health risks of elevated stress and causes of high stress.  Discuss the correlation between heart/lung disease and anxiety and treatment options. Review healthy ways to manage with stress and anxiety.   Pulmonary Rehab from 05/11/2017 in [facility] Cardiac and Pulmonary Rehab  Date   04/20/17  Educator  [facility]  Instruction Review Code  1- Verbalizes Understanding      Depression: - Provides group verbal and written instruction on the correlation between heart/lung disease and depressed mood, treatment options, and the stigmas associated with seeking treatment.   Exercise & Equipment Safety: - Individual verbal instruction and demonstration of equipment use and safety with use of the equipment.   Pulmonary Rehab from 05/11/2017 in [facility] Cardiac and Pulmonary Rehab  Date  04/05/17  Educator  [facility]  Instruction Review Code  1- Verbalizes Understanding      Infection Prevention: - Provides verbal and written material to individual with discussion of infection control including proper hand washing and proper equipment cleaning during exercise session.   Pulmonary Rehab from 05/11/2017 in [facility] Cardiac and Pulmonary Rehab  Date  04/05/17  Educator  [facility]  Instruction Review Code  1- Verbalizes Understanding      Falls Prevention: - Provides verbal and written material to individual with discussion of falls prevention and safety.   Pulmonary Rehab from 05/11/2017 in [facility] Cardiac and Pulmonary Rehab  Date  04/05/17  Educator  [facility]  Instruction Review Code  1- Verbalizes Understanding      Diabetes: - Individual verbal and written instruction to review signs/symptoms of diabetes, desired ranges of glucose level fasting, after meals and with exercise. Advice that pre and post exercise glucose checks will be done for 3 sessions at entry of program.   Chronic Lung Diseases: - Group verbal and written instruction to review updates, respiratory medications, advancements in procedures and treatments. Discuss use of supplemental oxygen including available portable oxygen systems, continuous and intermittent flow rates, concentrators, personal use and safety guidelines. Review proper use of inhaler and spacers. Provide informative websites for self-education.    Pulmonary Rehab from  05/11/2017 in [facility] Cardiac and Pulmonary Rehab  Date  05/11/17  Educator  [facility]  Instruction Review Code  1- Teaching laboratory technician: - Provide group verbal and written instruction for methods to conserve energy, plan and organize activities. Instruct on pacing techniques, use of adaptive equipment and posture/positioning to relieve shortness of breath.   Triggers and Exacerbations: - Group verbal and written instruction to review types of environmental triggers and ways to prevent exacerbations. Discuss weather changes, air quality and the benefits of nasal washing. Review warning signs and symptoms to help prevent infections. Discuss techniques for effective airway clearance, coughing, and vibrations.   AED/CPR: - Group verbal and written instruction with the use of models to demonstrate the basic use of the AED with the basic ABC's of resuscitation.   Pulmonary Rehab from 05/11/2017 in [facility] Cardiac and Pulmonary Rehab  Date  04/15/17  Educator  [facility]  Instruction Review Code  1- Actuary and Physiology of the Lungs: - Group verbal and written instruction with the use of models to provide basic lung anatomy and physiology related to function, structure and complications of lung disease.   Pulmonary Rehab from 05/11/2017 in [facility] Cardiac and Pulmonary Rehab  Date  04/27/17  Educator  [facility]  Instruction Review Code  1- Verbalizes Understanding      Anatomy & Physiology of the Heart: - Group verbal and written instruction and models provide basic cardiac anatomy and physiology, with the coronary electrical and arterial systems. Review of Valvular disease and Heart Failure   Cardiac Medications: - Group verbal and written instruction to review commonly prescribed medications for heart disease. Reviews the medication, class of the drug, and side effects.   Know Your Numbers and Risk Factors: -Group verbal and written instruction about  important numbers in your health.  Discussion of what are risk factors and how they play a role in the disease process.  Review of Cholesterol, Blood Pressure, Diabetes, and BMI and the role they play in your overall health.   Sleep Hygiene: -Provides group verbal and written instruction about how sleep can affect your health.  Define sleep hygiene, discuss sleep cycles and impact of sleep habits. Review good sleep hygiene tips.    Other: -Provides group and verbal instruction on various topics (see comments)    Knowledge Questionnaire Score: Knowledge Questionnaire Score - 04/05/17 1125      Knowledge Questionnaire Score   Pre Score  13/18 reviewed with patient        Core Components/Risk Factors/Patient Goals at Admission: Personal Goals and Risk Factors at Admission - 04/05/17 1127      Core Components/Risk Factors/Patient Goals on Admission    Weight Management  Yes;Weight Loss;Obesity    Intervention  Weight Management: Develop a combined nutrition and exercise program designed to reach desired caloric intake, while maintaining appropriate intake of nutrient and fiber, sodium and fats, and appropriate energy expenditure required for the weight goal.;Weight Management: Provide education and appropriate resources to help participant work on and attain dietary goals.;Weight Management/Obesity: Establish reasonable short term and long term weight goals.;Obesity: Provide education and appropriate resources to help participant work on and attain dietary goals.    Admit Weight  218 lb 12.8 oz (99.2 kg)    Goal Weight: Short Term  210 lb (95.3 kg)    Goal Weight: Long Term  200 lb (90.7 kg)    Expected Outcomes  Short Term: Continue to assess and modify interventions until short term weight is achieved;Long Term: Adherence to nutrition  and physical activity/exercise program aimed toward attainment of established weight goal;Weight Maintenance: Understanding of the daily nutrition  guidelines, which includes 25-35% calories from fat, 7% or less cal from saturated fats, less than '200mg'$  cholesterol, less than 1.5gm of sodium, & 5 or more servings of fruits and vegetables daily;Weight Loss: Understanding of general recommendations for a balanced deficit meal plan, which promotes 1-2 lb weight loss per week and includes a negative energy balance of [id number] kcal/d;Understanding recommendations for meals to include 15-35% energy as protein, 25-35% energy from fat, 35-60% energy from carbohydrates, less than '200mg'$  of dietary cholesterol, 20-35 gm of total fiber daily;Understanding of distribution of calorie intake throughout the day with the consumption of 4-5 meals/snacks    Improve shortness of breath with ADL's  Yes    Intervention  Provide education, individualized exercise plan and daily activity instruction to help decrease symptoms of SOB with activities of daily living.    Expected Outcomes  Short Term: Improve cardiorespiratory fitness to achieve a reduction of symptoms when performing ADLs;Long Term: Be able to perform more ADLs without symptoms or delay the onset of symptoms    Heart Failure  Yes    Intervention  Provide a combined exercise and nutrition program that is supplemented with education, support and counseling about heart failure. Directed toward relieving symptoms such as shortness of breath, decreased exercise tolerance, and extremity edema.    Expected Outcomes  Improve functional capacity of life;Short term: Daily weights obtained and reported for increase. Utilizing diuretic protocols set by physician.;Short term: Attendance in program 2-3 days a week with increased exercise capacity. Reported lower sodium intake. Reported increased fruit and vegetable intake. Reports medication compliance.;Long term: Adoption of self-care skills and reduction of barriers for early signs and symptoms recognition and intervention leading to self-care maintenance.    Hypertension  Yes     Intervention  Provide education on lifestyle modifcations including regular physical activity/exercise, weight management, moderate sodium restriction and increased consumption of fresh fruit, vegetables, and low fat dairy, alcohol moderation, and smoking cessation.;Monitor prescription use compliance.    Expected Outcomes  Short Term: Continued assessment and intervention until BP is < 140/14m HG in hypertensive participants. < 130/854mHG in hypertensive participants with diabetes, heart failure or chronic kidney disease.;Long Term: Maintenance of blood pressure at goal levels.    Lipids  Yes takes medication    Intervention  Provide education and support for participant on nutrition & aerobic/resistive exercise along with prescribed medications to achieve LDL '70mg'$ , HDL >'40mg'$ .    Expected Outcomes  Short Term: Participant states understanding of desired cholesterol values and is compliant with medications prescribed. Participant is following exercise prescription and nutrition guidelines.;Long Term: Cholesterol controlled with medications as prescribed, with individualized exercise RX and with personalized nutrition plan. Value goals: LDL < '70mg'$ , HDL > 40 mg.       Core Components/Risk Factors/Patient Goals Review:  Goals and Risk Factor Review    Row Name 05/11/17 1027             Core Components/Risk Factors/Patient Goals Review   Personal Goals Review  Weight Management/Obesity;Improve shortness of breath with ADL's;Lipids;Hypertension;Heart Failure       Review  RoJeancarlotated that he has had some hip pain recently. RoKaymanas missed a few days and feels like his breathing has not improved. His blood pressure has been a little elevated. He states that his cholestorol from his doctor was ok. He takes his cholesterol medication everyday.  Expected Outcomes  Short: continue to excerice to reduce Blood pressure. Long: Decrease blood pressure within normal ranges.          Core  Components/Risk Factors/Patient Goals at Discharge (Final Review):  Goals and Risk Factor Review - 05/11/17 1027      Core Components/Risk Factors/Patient Goals Review   Personal Goals Review  Weight Management/Obesity;Improve shortness of breath with ADL's;Lipids;Hypertension;Heart Failure    Review  Deitrick stated that he has had some hip pain recently. Compton has missed a few days and feels like his breathing has not improved. His blood pressure has been a little elevated. He states that his cholestorol from his doctor was ok. He takes his cholesterol medication everyday.    Expected Outcomes  Short: continue to excerice to reduce Blood pressure. Long: Decrease blood pressure within normal ranges.       ITP Comments: ITP Comments    Row Name 04/05/17 1048 04/25/17 0821 05/23/17 0825 05/27/17 1405 06/06/17 0840   ITP Comments  Medical Evaluation completed. Chart sent for review and changes to Dr. Emily Filbert Director of [place]. Diagnosis can be found in [facility] encounter 03/16/17  30 day review completed. ITP sent to Dr. Emily Filbert Director of [place]. Continue with ITP unless changes are made by physician.   30 day review completed. ITP sent to Dr. Emily Filbert Director of [place]. Continue with ITP unless changes are made by physician  [name], Gerad's daughter, called staff today to inform staff that he is still experiencing pain in his leg. He is working with his doctors to help resolve this pain and hopes to be back in class soon.   Called Muaaz to inform him that he has not been in [place] since 05/13/17. He states his leg is still hurting and he does not know when he is going to be back. Informed him that we can discharge him from the program for now and he can call back when his leg has healed. Informed him that he would need a doctors order that states he can return to [place]. Patient verbalizes understanding.   [place] Name 06/06/17 0841           ITP Comments  Discharge ITP sent and  signed by Dr. Sabra Heck.  Discharge Summary routed to PCP and Pulmonologist.          Comments: Discharge ITP

## 2017-06-06 NOTE — Telephone Encounter (Signed)
Called Johnathan [name] to inform him that he has not been in [place] since 05/13/17. He states his leg is still hurting and he does not know when he is going to be back. Informed him that we can discharge him from the program for now and he can call back when his leg has healed. Informed him that he would need a doctors order that states he can return to [place]. Patient verbalizes understanding.

## 2017-06-06 NOTE — Progress Notes (Signed)
Discharge Progress Report  Patient Details  Name: Johnathan [name] MRN: [id number] Date of Birth: 07-Dec-1925 Referring Provider:     Pulmonary Rehab from 04/05/2017 in [facility]  Referring Provider  Ramachandran       Number of Visits: 12/36  Reason for Discharge:  Early Exit:  Personal and Lack of attendance  Smoking History:  Social History   Tobacco Use  Smoking Status Former Smoker  . Packs/day: 1.00  . Years: 30.00  . Pack years: 30.00  . Types: Cigarettes  . Last attempt to quit: 02/16/2003  . Years since quitting: 14.3  Smokeless Tobacco Never Used  Tobacco Comment   is exposed to second hand smoke    Diagnosis:  COPD with chronic bronchitis and emphysema ([place])  ADL UCSD: Pulmonary Assessment Scores    Row Name 04/05/17 1146         ADL UCSD   ADL Phase  Entry     SOB Score total  91     Rest  0     Walk  5     Stairs  5     Bath  4     Dress  5     Shop  5       CAT Score   CAT Score  27       mMRC Score   mMRC Score  2        Initial Exercise Prescription: Initial Exercise Prescription - 04/05/17 1200      Date of Initial Exercise RX and Referring Provider   Date  04/05/17    Referring Provider  Ashby Dawes      Treadmill   MPH  1    Grade  0    Minutes  15 3/3/3    METs  1.77      NuStep   Level  1    SPM  80    Minutes  15    METs  1.7      Biostep-RELP   Level  1    SPM  50    Minutes  15    METs  2      Prescription Details   Frequency (times per week)  3    Duration  Progress to 45 minutes of aerobic exercise without signs/symptoms of physical distress      Intensity   THRR 40-80% of Max Heartrate  87-116    Ratings of Perceived Exertion  11-13    Perceived Dyspnea  0-4      Progression   Progression  Continue to progress workloads to maintain intensity without signs/symptoms of physical distress.      Resistance Training   Training Prescription  Yes    Weight  3 lb    Reps  10-15        Discharge Exercise Prescription (Final Exercise Prescription Changes): Exercise Prescription Changes - 05/25/17 1300      Response to Exercise   Blood Pressure (Admit)  130/72    Blood Pressure (Exit)  148/78    Heart Rate (Admit)  69 bpm    Heart Rate (Exercise)  100 bpm    Heart Rate (Exit)  60 bpm    Oxygen Saturation (Admit)  94 %    Oxygen Saturation (Exercise)  93 %    Oxygen Saturation (Exit)  95 %    Rating of Perceived Exertion (Exercise)  14    Perceived Dyspnea (Exercise)  3  Duration  Continue with 45 min of aerobic exercise without signs/symptoms of physical distress.    Intensity  THRR unchanged      Progression   Progression  Continue to progress workloads to maintain intensity without signs/symptoms of physical distress.    Average METs  2      Resistance Training   Training Prescription  Yes    Weight  3 lb    Reps  10-15      Interval Training   Interval Training  No      NuStep   Level  4    SPM  82    Minutes  15    METs  2.2      Biostep-RELP   Level  3    Minutes  15    METs  2      Home Exercise Plan   Plans to continue exercise at  [employer]    Frequency  Add 1 additional day to program exercise sessions.    Initial Home Exercises Provided  05/11/17       Functional Capacity: 6 Minute Walk    Row Name 04/05/17 1253         6 Minute Walk   Distance  630 feet     Walk Time  4 minutes     # of Rest Breaks  2     MPH  1.78     METS  2.3     RPE  13     Perceived Dyspnea   3     VO2 Peak  1.99     Symptoms  Yes (comment)     Comments  Short of breath      Resting HR  59 bpm     Resting BP  122/66     Resting Oxygen Saturation   95 %     Exercise Oxygen Saturation  during 6 min walk  88 %     Max Ex. HR  115 bpm     Max Ex. BP  136/68     2 Minute Post BP  126/66       Interval HR   1 Minute HR  90     2 Minute HR  95     4 Minute HR  84     5 Minute HR  115     6 Minute HR  113     2 Minute Post HR  85      Interval Heart Rate?  Yes       Interval Oxygen   Interval Oxygen?  Yes     Baseline Oxygen Saturation %  95 %     1 Minute Oxygen Saturation %  90 %     1 Minute Liters of Oxygen  0 L     2 Minute Oxygen Saturation %  95 %     2 Minute Liters of Oxygen  0 L     3 Minute Oxygen Saturation %  88 %     3 Minute Liters of Oxygen  0 L     4 Minute Oxygen Saturation %  96 %     4 Minute Liters of Oxygen  0 L     5 Minute Oxygen Saturation %  90 %     5 Minute Liters of Oxygen  0 L     6 Minute Oxygen Saturation %  91 %     6 Minute Liters of Oxygen  0 L     2 Minute Post Oxygen Saturation %  97 %     2 Minute Post Liters of Oxygen  0 L        Psychological, QOL, Others - Outcomes: PHQ 2/9: Depression screen [facility] 2/9 04/05/2017 04/15/2016 01/08/2015 08/07/2014  Decreased Interest 3 0 0 0  Down, Depressed, Hopeless 0 0 0 0  PHQ - 2 Score 3 0 0 0  Altered sleeping 3 - - -  Tired, decreased energy 3 - - -  Change in appetite 0 - - -  Feeling bad or failure about yourself  0 - - -  Trouble concentrating 0 - - -  Moving slowly or fidgety/restless 0 - - -  Suicidal thoughts 0 - - -  PHQ-9 Score 9 - - -  Difficult doing work/chores Somewhat difficult - - -    Quality of Life:   Personal Goals: Goals established at orientation with interventions provided to work toward goal. Personal Goals and Risk Factors at Admission - 04/05/17 1127      Core Components/Risk Factors/Patient Goals on Admission    Weight Management  Yes;Weight Loss;Obesity    Intervention  Weight Management: Develop a combined nutrition and exercise program designed to reach desired caloric intake, while maintaining appropriate intake of nutrient and fiber, sodium and fats, and appropriate energy expenditure required for the weight goal.;Weight Management: Provide education and appropriate resources to help participant work on and attain dietary goals.;Weight Management/Obesity: Establish reasonable short term and long  term weight goals.;Obesity: Provide education and appropriate resources to help participant work on and attain dietary goals.    Admit Weight  218 lb 12.8 oz (99.2 kg)    Goal Weight: Short Term  210 lb (95.3 kg)    Goal Weight: Long Term  200 lb (90.7 kg)    Expected Outcomes  Short Term: Continue to assess and modify interventions until short term weight is achieved;Long Term: Adherence to nutrition and physical activity/exercise program aimed toward attainment of established weight goal;Weight Maintenance: Understanding of the daily nutrition guidelines, which includes 25-35% calories from fat, 7% or less cal from saturated fats, less than 200mg  cholesterol, less than 1.5gm of sodium, & 5 or more servings of fruits and vegetables daily;Weight Loss: Understanding of general recommendations for a balanced deficit meal plan, which promotes 1-2 lb weight loss per week and includes a negative energy balance of [phone] kcal/d;Understanding recommendations for meals to include 15-35% energy as protein, 25-35% energy from fat, 35-60% energy from carbohydrates, less than 200mg  of dietary cholesterol, 20-35 gm of total fiber daily;Understanding of distribution of calorie intake throughout the day with the consumption of 4-5 meals/snacks    Improve shortness of breath with ADL's  Yes    Intervention  Provide education, individualized exercise plan and daily activity instruction to help decrease symptoms of SOB with activities of daily living.    Expected Outcomes  Short Term: Improve cardiorespiratory fitness to achieve a reduction of symptoms when performing ADLs;Long Term: Be able to perform more ADLs without symptoms or delay the onset of symptoms    Heart Failure  Yes    Intervention  Provide a combined exercise and nutrition program that is supplemented with education, support and counseling about heart failure. Directed toward relieving symptoms such as shortness of breath, decreased exercise tolerance, and  extremity edema.    Expected Outcomes  Improve functional capacity of life;Short term: Daily weights obtained and reported for increase. Utilizing diuretic protocols set by  physician.;Short term: Attendance in program 2-3 days a week with increased exercise capacity. Reported lower sodium intake. Reported increased fruit and vegetable intake. Reports medication compliance.;Long term: Adoption of self-care skills and reduction of barriers for early signs and symptoms recognition and intervention leading to self-care maintenance.    Hypertension  Yes    Intervention  Provide education on lifestyle modifcations including regular physical activity/exercise, weight management, moderate sodium restriction and increased consumption of fresh fruit, vegetables, and low fat dairy, alcohol moderation, and smoking cessation.;Monitor prescription use compliance.    Expected Outcomes  Short Term: Continued assessment and intervention until BP is < 140/68mm HG in hypertensive participants. < 130/2mm HG in hypertensive participants with diabetes, heart failure or chronic kidney disease.;Long Term: Maintenance of blood pressure at goal levels.    Lipids  Yes takes medication    Intervention  Provide education and support for participant on nutrition & aerobic/resistive exercise along with prescribed medications to achieve LDL 70mg , HDL >40mg .    Expected Outcomes  Short Term: Participant states understanding of desired cholesterol values and is compliant with medications prescribed. Participant is following exercise prescription and nutrition guidelines.;Long Term: Cholesterol controlled with medications as prescribed, with individualized exercise RX and with personalized nutrition plan. Value goals: LDL < 70mg , HDL > 40 mg.        Personal Goals Discharge: Goals and Risk Factor Review    Row Name 05/11/17 1027             Core Components/Risk Factors/Patient Goals Review   Personal Goals Review  Weight  Management/Obesity;Improve shortness of breath with ADL's;Lipids;Hypertension;Heart Failure       Review  Johnathan [name] stated that he has had some hip pain recently. Johnathan [name] has missed a few days and feels like his breathing has not improved. His blood pressure has been a little elevated. He states that his cholestorol from his doctor was ok. He takes his cholesterol medication everyday.       Expected Outcomes  Short: continue to excerice to reduce Blood pressure. Long: Decrease blood pressure within normal ranges.          Exercise Goals and Review: Exercise Goals    Row Name 04/05/17 1252             Exercise Goals   Increase Physical Activity  Yes       Intervention  Provide advice, education, support and counseling about physical activity/exercise needs.;Develop an individualized exercise prescription for aerobic and resistive training based on initial evaluation findings, risk stratification, comorbidities and participant's personal goals.       Expected Outcomes  Short Term: Attend rehab on a regular basis to increase amount of physical activity.;Long Term: Add in home exercise to make exercise part of routine and to increase amount of physical activity.;Long Term: Exercising regularly at least 3-5 days a week.       Increase Strength and Stamina  Yes       Intervention  Provide advice, education, support and counseling about physical activity/exercise needs.;Develop an individualized exercise prescription for aerobic and resistive training based on initial evaluation findings, risk stratification, comorbidities and participant's personal goals.       Expected Outcomes  Short Term: Increase workloads from initial exercise prescription for resistance, speed, and METs.;Short Term: Perform resistance training exercises routinely during rehab and add in resistance training at home;Long Term: Improve cardiorespiratory fitness, muscular endurance and strength as measured by increased METs and  functional capacity (6MWT)  Able to understand and use rate of perceived exertion (RPE) scale  Yes       Intervention  Provide education and explanation on how to use RPE scale       Expected Outcomes  Short Term: Able to use RPE daily in rehab to express subjective intensity level;Long Term:  Able to use RPE to guide intensity level when exercising independently       Able to understand and use Dyspnea scale  Yes       Intervention  Provide education and explanation on how to use Dyspnea scale       Expected Outcomes  Short Term: Able to use Dyspnea scale daily in rehab to express subjective sense of shortness of breath during exertion;Long Term: Able to use Dyspnea scale to guide intensity level when exercising independently       Knowledge and understanding of Target Heart Rate Range (THRR)  Yes       Intervention  Provide education and explanation of THRR including how the numbers were predicted and where they are located for reference       Expected Outcomes  Short Term: Able to state/look up THRR;Long Term: Able to use THRR to govern intensity when exercising independently;Short Term: Able to use daily as guideline for intensity in rehab       Able to check pulse independently  Yes       Intervention  Provide education and demonstration on how to check pulse in carotid and radial arteries.;Review the importance of being able to check your own pulse for safety during independent exercise       Expected Outcomes  Short Term: Able to explain why pulse checking is important during independent exercise;Long Term: Able to check pulse independently and accurately       Understanding of Exercise Prescription  Yes       Intervention  Provide education, explanation, and written materials on patient's individual exercise prescription       Expected Outcomes  Short Term: Able to explain program exercise prescription;Long Term: Able to explain home exercise prescription to exercise independently           Nutrition & Weight - Outcomes: Pre Biometrics - 04/05/17 1252      Pre Biometrics   Height  5' 8.25" (1.734 m)    Weight  218 lb 12.8 oz (99.2 kg)    Waist Circumference  44.75 inches    Hip Circumference  46 inches    Waist to Hip Ratio  0.97 %    BMI (Calculated)  33.01        Nutrition: Nutrition Therapy & Goals - 05/02/17 1213      Nutrition Therapy   Diet  TLC    Protein (specify units)  12oz    Fiber  30 grams    Whole Grain Foods  2 servings    Saturated Fats  15 max. grams    Fruits and Vegetables  4 servings/day 8 ideal    Sodium  1500 grams      Personal Nutrition Goals   Nutrition Goal  Reduce the amount of snacks you consume between meals, particularly in the evenings    Personal Goal #2  Try to include a source of protein at each meal    Personal Goal #3  Look for lower calorie snacks and/or reduce portion sizes of the snacks you currently consume    Comments  Patient eats small meals but snacks heavily througout the day and  evening. He is not very active.      Intervention Plan   Intervention  Prescribe, educate and counsel regarding individualized specific dietary modifications aiming towards targeted core components such as weight, hypertension, lipid management, diabetes, heart failure and other comorbidities.;Nutrition handout(s) given to patient. Guidelines for losing weight handout reviewed    Expected Outcomes  Short Term Goal: Understand basic principles of dietary content, such as calories, fat, sodium, cholesterol and nutrients.;Short Term Goal: A plan has been developed with personal nutrition goals set during dietitian appointment.;Long Term Goal: Adherence to prescribed nutrition plan.       Nutrition Discharge: Nutrition Assessments - 04/05/17 1123      MEDFICTS Scores   Pre Score  35       Education Questionnaire Score: Knowledge Questionnaire Score - 04/05/17 1125      Knowledge Questionnaire Score   Pre Score  13/18 reviewed with  patient       Goals reviewed with patient; copy given to patient.

## 2017-06-17 ENCOUNTER — Ambulatory Visit (INDEPENDENT_AMBULATORY_CARE_PROVIDER_SITE_OTHER): Payer: Medicare Other | Admitting: Internal Medicine

## 2017-06-17 ENCOUNTER — Encounter: Payer: Self-pay | Admitting: Internal Medicine

## 2017-06-17 VITALS — BP 142/82 | HR 61 | Ht 68.0 in | Wt 220.0 lb

## 2017-06-17 DIAGNOSIS — J209 Acute bronchitis, unspecified: Secondary | ICD-10-CM

## 2017-06-17 DIAGNOSIS — J440 Chronic obstructive pulmonary disease with acute lower respiratory infection: Secondary | ICD-10-CM

## 2017-06-17 DIAGNOSIS — J441 Chronic obstructive pulmonary disease with (acute) exacerbation: Secondary | ICD-10-CM | POA: Diagnosis not present

## 2017-06-17 DIAGNOSIS — J449 Chronic obstructive pulmonary disease, unspecified: Secondary | ICD-10-CM

## 2017-06-17 MED ORDER — AZITHROMYCIN 250 MG PO TABS: 250.0000 mg | ORAL_TABLET | Freq: Every day | ORAL | 0 refills | Status: DC

## 2017-06-17 MED ORDER — PREDNISONE 10 MG (21) PO TBPK: ORAL_TABLET | ORAL | 0 refills | Status: DC

## 2017-06-17 MED ORDER — FLUTICASONE-SALMETEROL 250-50 MCG/DOSE IN AEPB: 1.0000 | INHALATION_SPRAY | Freq: Two times a day (BID) | RESPIRATORY_TRACT | 5 refills | Status: DC

## 2017-06-17 NOTE — Patient Instructions (Addendum)
Start nebulizer medication three times daily until you feel better, then do it as needed.  Continue advair and spiriva.  Call back on Monday if not feeling better.  Will start antibiotic and prednisone.

## 2017-06-17 NOTE — Progress Notes (Signed)
[facility] Consultation      Assessment and Plan:  84 year old male with progressive dyspnea on exertion, now severe, with severe emphysema and recent hospitalization for COPD exacerbation.  COPD, group D.  Now with acute bronchitis with acute exacerbation of COPD. - Change Advair to 250, started azithromycin and prednisone.  Patient is asked to call us back in 4 to 5 days if not improved, at that time we will consider hospital admission or further work-up with chest x-ray. [facility] admission for COPD January 2019, continued reduced functional status, this, complicated by severe deconditioning with reduced muscle strength which is likely contributing to his dyspnea. - Continue Advair, nebulizers, albuterol, spiriva respimat.   --Keep pets out of bedroom. Rinse mouth after advair, call for refills when medications run out.   Deconditioning with severe exertional dyspnea. -Decline in functional status over the last 6 months, will refer to pulmonary rehab.  Per insurance requirements he requires a PFT beforehand.  Meds ordered this encounter  Medications  . Fluticasone-Salmeterol (ADVAIR DISKUS) 250-50 MCG/DOSE AEPB    Sig: Inhale 1 puff into the lungs 2 (two) times daily.    Dispense:  1 each    Refill:  5  . azithromycin (ZITHROMAX) 250 MG tablet    Sig: Take 1 tablet (250 mg total) by mouth daily. Take 2 tablets the first day, then once daily until gone.    Dispense:  6 tablet    Refill:  0  . predniSONE (STERAPRED UNI-PAK 21 TAB) 10 MG (21) TBPK tablet    Sig: Take as directed.    Dispense:  21 tablet    Refill:  0   Return in about 3 months (around 09/17/2017).    Date: 06/17/2017  MRN# [id number] Johnathan [name] [birth year]   Johnathan [name] is a 84 y.o. old male seen in consultation for chief complaint of:    Chief Complaint  Patient presents with  . Acute Visit    SOB at all times: wheezing: prod cough: neb tx not helping    HPI:   The patient is an  84 year old male with a history of COPD with exacerbation.  At last visit he was asked to continue Advair, nebulizers, flutter valve, Spiriva, and asked to use his flutter valve after breathing treatments, rinse mouth after using advair, keep pets out of bedroom.    He comes in today as an urgent appt he is brought in by his daughter who gives some of the history. His breathing has been worse since yesterday, and progressed through the night. He has been having a lot of coughing and rattling in his lungs, with tightness in his chest. He can no longer walk as far as he used to without getting winded.   He is using advair twice daily, does not rinse mouth. He is is doing spiriva respimat 1 puff bid. He is not doing nebulizer. He is doing a flutter valve occasionally.    He has a cat at home, sleeps in bed with him.  He has some reflux, he takes nexium which helps.  He does have some sinus drainage, he does not take anything for it.    Imaging personally reviewed, chest x-ray 03/16/17, hyperinflation consistent with emphysema, increased interstitial markings in both bases likely some degree of interstitial scarring. CT chest 07/01/15; mild emphysema.  **Full PFT 03/22/17; FVC 68%, FEV1 48% predicted, there is no improvement with bronchodilator, flow volume loop appears obstructed. TLC 79% predicted, RV to TLC ratio  is normal.  Diffusion capacity is 51%. -Overall this test is consistent with severe obstructive lung disease.  **Desat walk on rest on RA sat is 92% and HR 51. Walked 150 feet, sat dropped to 89%, HR 75, severe dyspnea, poor gait.  Social Hx:   Social History   Tobacco Use  . Smoking status: Former Smoker    Packs/day: 1.00    Years: 30.00    Pack years: 30.00    Types: Cigarettes    Last attempt to quit: 02/16/2003    Years since quitting: 14.3  . Smokeless tobacco: Never Used  . Tobacco comment: is exposed to second hand smoke  Substance Use Topics  . Alcohol use: No  . Drug  use: No   Medication:    Current Outpatient Medications:  .  albuterol (ACCUNEB) 0.63 MG/3ML nebulizer solution, Take 1 ampule by nebulization every 4 (four) hours as needed for wheezing., Disp: , Rfl:  .  albuterol (PROVENTIL HFA;VENTOLIN HFA) 108 (90 Base) MCG/ACT inhaler, Inhale 2 puffs into the lungs every 6 (six) hours as needed for wheezing or shortness of breath., Disp: 1 Inhaler, Rfl: 1 .  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  amLODipine (NORVASC) 5 MG tablet, Take 1 tablet (5 mg total) by mouth daily., Disp: 90 tablet, Rfl: 3 .  amLODipine (NORVASC) 5 MG tablet, TAKE 1 TABLET DAILY, Disp: 90 tablet, Rfl: 3 .  aspirin 325 MG EC tablet, Take 325 mg by mouth daily., Disp: , Rfl:  .  brimonidine (ALPHAGAN) 0.2 % ophthalmic solution, Place 1 drop into both eyes 2 (two) times daily. , Disp: , Rfl:  .  esomeprazole (NEXIUM) 20 MG capsule, TAKE 1 CAPSULE TWICE A DAY, Disp: 180 capsule, Rfl: 2 .  Fluticasone-Salmeterol (ADVAIR DISKUS) 100-50 MCG/DOSE AEPB, Inhale 1 puff into the lungs 2 (two) times daily., Disp: 1 each, Rfl: 1 .  furosemide (LASIX) 20 MG tablet, Take 1 tablet (20 mg total) by mouth daily., Disp: 5 tablet, Rfl: 11 .  latanoprost (XALATAN) 0.005 % ophthalmic solution, Place 1 drop into both eyes at bedtime. , Disp: , Rfl:  .  Polyethylene Glycol 3350 (MIRALAX PO), Take by mouth as needed., Disp: , Rfl:  .  prednisoLONE acetate (PRED FORTE) 1 % ophthalmic suspension, Place 1 drop into the left eye daily. , Disp: , Rfl:  .  simvastatin (ZOCOR) 20 MG tablet, TAKE 1 TABLET DAILY, Disp: 90 tablet, Rfl: 3 .  Tiotropium Bromide Monohydrate (SPIRIVA RESPIMAT) 2.5 MCG/ACT AERS, Inhale 2 puffs into the lungs daily., Disp: 4 g, Rfl: 5 .  traZODone (DESYREL) 50 MG tablet, Take 0.5-1 tablets (25-50 mg total) by mouth at bedtime as needed for sleep., Disp: 90 tablet, Rfl: 1  Current Facility-Administered Medications:  .   ipratropium-albuterol (DUONEB) 0.5-2.5 (3) MG/3ML nebulizer solution 3 mL, 3 mL, Nebulization, Once, Jerrol Banana., MD   Allergies:  Sulfa antibiotics and Penicillins  Review of Systems: Gen:  Denies  fever, sweats, chills HEENT: Denies blurred vision, double vision. bleeds, sore throat Cvc:  No dizziness, chest pain. Resp:   Denies cough or sputum production, shortness of breath Gi: Denies swallowing difficulty, stomach pain. Gu:  Denies bladder incontinence, burning urine Ext:   No Joint pain, stiffness. Skin: No skin rash,  hives  Endoc:  No polyuria, polydipsia. Psych: No depression, insomnia. Other:  All other systems were reviewed with the patient and were negative other that what is mentioned  in the HPI.   Physical Examination:   VS: BP (!) 142/82 (BP Location: Left Arm, Cuff Size: Normal)   Pulse 61   Ht 5\' 8"  (1.727 m)   Wt 220 lb (99.8 kg)   SpO2 96%   BMI 33.45 kg/m   General Appearance: No distress  Neuro:without focal findings,  speech normal,  HEENT: PERRLA, EOM intact.   Pulmonary: normal breath sounds, No wheezing.  CardiovascularNormal S1,S2.  No m/r/g.   Abdomen: Benign, Soft, non-tender. Renal:  No costovertebral tenderness  GU:  No performed at this time. Endoc: No evident thyromegaly, no signs of acromegaly. Skin:   warm, no rashes, no ecchymosis  Extremities: normal, no cyanosis, clubbing.  Other findings:    LABORATORY PANEL:   CBC No results for input(s): WBC, HGB, HCT, PLT in the last 168 hours. ------------------------------------------------------------------------------------------------------------------  Chemistries  No results for input(s): NA, K, CL, CO2, GLUCOSE, BUN, CREATININE, CALCIUM, MG, AST, ALT, ALKPHOS, BILITOT in the last 168 hours.  Invalid input(s): GFRCGP ------------------------------------------------------------------------------------------------------------------  Cardiac Enzymes No results for  input(s): TROPONINI in the last 168 hours. ------------------------------------------------------------  RADIOLOGY:  No results found.     Thank  you for the consultation and for allowing [facility] to assist in the care of your patient. Our recommendations are noted above.  Please contact us if we can be of further service.   Marda Stalker, MD.  Board Certified in Internal Medicine, Pulmonary Medicine, [place], and Sleep Medicine.  [facility] Office Number: [phone]  Patricia Pesa, M.D.  Merton Border, M.D  06/17/2017

## 2017-06-28 ENCOUNTER — Telehealth: Payer: Self-pay | Admitting: Internal Medicine

## 2017-06-28 MED ORDER — ALBUTEROL SULFATE 0.63 MG/3ML IN NEBU: 1.0000 | INHALATION_SOLUTION | RESPIRATORY_TRACT | 11 refills | Status: DC | PRN

## 2017-06-28 NOTE — Telephone Encounter (Signed)
°*  STAT* If patient is at the pharmacy, call can be transferred to refill team.   1. Which medications need to be refilled? (please list name of each medication and dose if known) albuterol 0.63 mg/ 3 ml Neb q 4 hr prn   2. Which pharmacy/location (including street and city if local pharmacy) is medication to be sent to? [facility]   3. Do they need a 30 day or 90 day supply? [place]

## 2017-06-28 NOTE — Telephone Encounter (Signed)
rx sent

## 2017-07-23 ENCOUNTER — Other Ambulatory Visit: Payer: Self-pay

## 2017-07-23 ENCOUNTER — Encounter: Payer: Self-pay | Admitting: Emergency Medicine

## 2017-07-23 ENCOUNTER — Inpatient Hospital Stay
Admission: EM | Admit: 2017-07-23 | Discharge: 2017-07-25 | DRG: 193 | Disposition: A | Discharge status: Home / Self Care | Payer: Medicare Other | Attending: Internal Medicine | Admitting: Internal Medicine

## 2017-07-23 ENCOUNTER — Emergency Department: Payer: Medicare Other

## 2017-07-23 DIAGNOSIS — Z7722 Contact with and (suspected) exposure to environmental tobacco smoke (acute) (chronic): Secondary | ICD-10-CM | POA: Diagnosis present

## 2017-07-23 DIAGNOSIS — I2510 Atherosclerotic heart disease of native coronary artery without angina pectoris: Secondary | ICD-10-CM | POA: Diagnosis present

## 2017-07-23 DIAGNOSIS — H409 Unspecified glaucoma: Secondary | ICD-10-CM | POA: Diagnosis present

## 2017-07-23 DIAGNOSIS — Z8249 Family history of ischemic heart disease and other diseases of the circulatory system: Secondary | ICD-10-CM | POA: Diagnosis not present

## 2017-07-23 DIAGNOSIS — R739 Hyperglycemia, unspecified: Secondary | ICD-10-CM | POA: Diagnosis present

## 2017-07-23 DIAGNOSIS — Z7982 Long term (current) use of aspirin: Secondary | ICD-10-CM

## 2017-07-23 DIAGNOSIS — J441 Chronic obstructive pulmonary disease with (acute) exacerbation: Secondary | ICD-10-CM | POA: Diagnosis present

## 2017-07-23 DIAGNOSIS — J9600 Acute respiratory failure, unspecified whether with hypoxia or hypercapnia: Secondary | ICD-10-CM | POA: Diagnosis present

## 2017-07-23 DIAGNOSIS — K219 Gastro-esophageal reflux disease without esophagitis: Secondary | ICD-10-CM | POA: Diagnosis present

## 2017-07-23 DIAGNOSIS — K5900 Constipation, unspecified: Secondary | ICD-10-CM | POA: Diagnosis present

## 2017-07-23 DIAGNOSIS — Z951 Presence of aortocoronary bypass graft: Secondary | ICD-10-CM | POA: Diagnosis not present

## 2017-07-23 DIAGNOSIS — Z880 Allergy status to penicillin: Secondary | ICD-10-CM

## 2017-07-23 DIAGNOSIS — J181 Lobar pneumonia, unspecified organism: Secondary | ICD-10-CM | POA: Diagnosis present

## 2017-07-23 DIAGNOSIS — Z87891 Personal history of nicotine dependence: Secondary | ICD-10-CM | POA: Diagnosis not present

## 2017-07-23 DIAGNOSIS — H353190 Nonexudative age-related macular degeneration, unspecified eye, stage unspecified: Secondary | ICD-10-CM | POA: Diagnosis present

## 2017-07-23 DIAGNOSIS — Z7951 Long term (current) use of inhaled steroids: Secondary | ICD-10-CM

## 2017-07-23 DIAGNOSIS — Z8673 Personal history of transient ischemic attack (TIA), and cerebral infarction without residual deficits: Secondary | ICD-10-CM

## 2017-07-23 DIAGNOSIS — I4892 Unspecified atrial flutter: Secondary | ICD-10-CM | POA: Diagnosis present

## 2017-07-23 DIAGNOSIS — J440 Chronic obstructive pulmonary disease with acute lower respiratory infection: Secondary | ICD-10-CM | POA: Diagnosis present

## 2017-07-23 DIAGNOSIS — G4730 Sleep apnea, unspecified: Secondary | ICD-10-CM | POA: Diagnosis present

## 2017-07-23 DIAGNOSIS — N183 Chronic kidney disease, stage 3 (moderate): Secondary | ICD-10-CM | POA: Diagnosis present

## 2017-07-23 DIAGNOSIS — Z882 Allergy status to sulfonamides status: Secondary | ICD-10-CM | POA: Diagnosis not present

## 2017-07-23 DIAGNOSIS — I129 Hypertensive chronic kidney disease with stage 1 through stage 4 chronic kidney disease, or unspecified chronic kidney disease: Secondary | ICD-10-CM | POA: Diagnosis present

## 2017-07-23 DIAGNOSIS — E785 Hyperlipidemia, unspecified: Secondary | ICD-10-CM | POA: Diagnosis present

## 2017-07-23 DIAGNOSIS — J9601 Acute respiratory failure with hypoxia: Secondary | ICD-10-CM | POA: Diagnosis present

## 2017-07-23 DIAGNOSIS — J189 Pneumonia, unspecified organism: Secondary | ICD-10-CM

## 2017-07-23 DIAGNOSIS — H9190 Unspecified hearing loss, unspecified ear: Secondary | ICD-10-CM | POA: Diagnosis present

## 2017-07-23 LAB — CBC
HCT: 44.4 % (ref 40.0–52.0)
Hemoglobin: 14.9 g/dL (ref 13.0–18.0)
MCH: 31.3 pg (ref 26.0–34.0)
MCHC: 33.6 g/dL (ref 32.0–36.0)
MCV: 93 fL (ref 80.0–100.0)
Platelets: 214 10*3/uL (ref 150–440)
RBC: 4.77 MIL/uL (ref 4.40–5.90)
RDW: 15.4 % — ABNORMAL HIGH (ref 11.5–14.5)
WBC: 10 10*3/uL (ref 3.8–10.6)

## 2017-07-23 LAB — COMPREHENSIVE METABOLIC PANEL
ALT: 15 U/L — ABNORMAL LOW (ref 17–63)
AST: 24 U/L (ref 15–41)
Albumin: 4.3 g/dL (ref 3.5–5.0)
Alkaline Phosphatase: 80 U/L (ref 38–126)
Anion gap: 11 (ref 5–15)
BUN: 20 mg/dL (ref 6–20)
CO2: 24 mmol/L (ref 22–32)
Calcium: 9 mg/dL (ref 8.9–10.3)
Chloride: 103 mmol/L (ref 101–111)
Creatinine, Ser: 1.31 mg/dL — ABNORMAL HIGH (ref 0.61–1.24)
GFR calc Af Amer: 54 mL/min — ABNORMAL LOW (ref >60)
GFR calc non Af Amer: 47 mL/min — ABNORMAL LOW (ref >60)
Glucose, Bld: 105 mg/dL — ABNORMAL HIGH (ref 65–99)
Potassium: 4.6 mmol/L (ref 3.5–5.1)
Sodium: 138 mmol/L (ref 135–145)
Total Bilirubin: 0.7 mg/dL (ref 0.3–1.2)
Total Protein: 7.6 g/dL (ref 6.5–8.1)

## 2017-07-23 LAB — BRAIN NATRIURETIC PEPTIDE: B Natriuretic Peptide: 450 pg/mL — ABNORMAL HIGH (ref 0.0–100.0)

## 2017-07-23 LAB — TROPONIN I: Troponin I: <0.03 ng/mL (ref <0.03)

## 2017-07-23 MED ORDER — TRAZODONE HCL 50 MG PO TABS
25.0000 mg | ORAL_TABLET | Freq: Every evening | ORAL | Status: DC | PRN
  Administered 2017-07-24: 50 mg via ORAL
  Filled 2017-07-23: qty 1

## 2017-07-23 MED ORDER — BISACODYL 5 MG PO TBEC: 5.0000 mg | DELAYED_RELEASE_TABLET | Freq: Every day | ORAL | Status: DC | PRN

## 2017-07-23 MED ORDER — ALBUTEROL SULFATE (2.5 MG/3ML) 0.083% IN NEBU
5.0000 mg | INHALATION_SOLUTION | Freq: Once | RESPIRATORY_TRACT | Status: AC
  Administered 2017-07-23: 5 mg via RESPIRATORY_TRACT
  Filled 2017-07-23: qty 6

## 2017-07-23 MED ORDER — HYDROCODONE-ACETAMINOPHEN 5-325 MG PO TABS: 1.0000 | ORAL_TABLET | ORAL | Status: DC | PRN

## 2017-07-23 MED ORDER — ACETAMINOPHEN 650 MG RE SUPP: 650.0000 mg | Freq: Four times a day (QID) | RECTAL | Status: DC | PRN

## 2017-07-23 MED ORDER — IPRATROPIUM-ALBUTEROL 0.5-2.5 (3) MG/3ML IN SOLN
3.0000 mL | Freq: Once | RESPIRATORY_TRACT | Status: AC
  Administered 2017-07-23: 3 mL via RESPIRATORY_TRACT
  Filled 2017-07-23: qty 3

## 2017-07-23 MED ORDER — AMLODIPINE BESYLATE 5 MG PO TABS
5.0000 mg | ORAL_TABLET | Freq: Every day | ORAL | Status: DC
  Administered 2017-07-24 – 2017-07-25 (×2): 5 mg via ORAL
  Filled 2017-07-23 (×2): qty 1

## 2017-07-23 MED ORDER — METHYLPREDNISOLONE SODIUM SUCC 125 MG IJ SOLR
125.0000 mg | Freq: Once | INTRAMUSCULAR | Status: AC
  Administered 2017-07-23: 125 mg via INTRAVENOUS
  Filled 2017-07-23: qty 2

## 2017-07-23 MED ORDER — ALBUTEROL SULFATE (2.5 MG/3ML) 0.083% IN NEBU
INHALATION_SOLUTION | RESPIRATORY_TRACT | Status: AC
  Filled 2017-07-23: qty 3

## 2017-07-23 MED ORDER — TIOTROPIUM BROMIDE MONOHYDRATE 18 MCG IN CAPS
18.0000 ug | ORAL_CAPSULE | Freq: Every day | RESPIRATORY_TRACT | Status: DC
  Administered 2017-07-24 – 2017-07-25 (×2): 18 ug via RESPIRATORY_TRACT
  Filled 2017-07-23: qty 5

## 2017-07-23 MED ORDER — DOCUSATE SODIUM 100 MG PO CAPS
100.0000 mg | ORAL_CAPSULE | Freq: Two times a day (BID) | ORAL | Status: DC
  Administered 2017-07-24 – 2017-07-25 (×4): 100 mg via ORAL
  Filled 2017-07-23 (×4): qty 1

## 2017-07-23 MED ORDER — VANCOMYCIN HCL IN DEXTROSE 1-5 GM/200ML-% IV SOLN
1000.0000 mg | Freq: Once | INTRAVENOUS | Status: AC
  Administered 2017-07-24: 1000 mg via INTRAVENOUS
  Filled 2017-07-23 (×2): qty 200

## 2017-07-23 MED ORDER — PANTOPRAZOLE SODIUM 40 MG PO TBEC
40.0000 mg | DELAYED_RELEASE_TABLET | Freq: Every day | ORAL | Status: DC
  Administered 2017-07-24 – 2017-07-25 (×2): 40 mg via ORAL
  Filled 2017-07-23 (×2): qty 1

## 2017-07-23 MED ORDER — FLUTICASONE FUROATE-VILANTEROL 200-25 MCG/INH IN AEPB
1.0000 | INHALATION_SPRAY | Freq: Every day | RESPIRATORY_TRACT | Status: DC
  Administered 2017-07-24 – 2017-07-25 (×2): 1 via RESPIRATORY_TRACT
  Filled 2017-07-23: qty 28

## 2017-07-23 MED ORDER — ONDANSETRON HCL 4 MG PO TABS: 4.0000 mg | ORAL_TABLET | Freq: Four times a day (QID) | ORAL | Status: DC | PRN

## 2017-07-23 MED ORDER — BRIMONIDINE TARTRATE 0.2 % OP SOLN
1.0000 [drp] | Freq: Two times a day (BID) | OPHTHALMIC | Status: DC
  Administered 2017-07-24 – 2017-07-25 (×3): 1 [drp] via OPHTHALMIC
  Filled 2017-07-23: qty 5

## 2017-07-23 MED ORDER — SODIUM CHLORIDE 0.9 % IV SOLN
2.0000 g | Freq: Once | INTRAVENOUS | Status: AC
  Administered 2017-07-23: 2 g via INTRAVENOUS
  Filled 2017-07-23: qty 2

## 2017-07-23 MED ORDER — HEPARIN SODIUM (PORCINE) 5000 UNIT/ML IJ SOLN
5000.0000 [IU] | Freq: Three times a day (TID) | INTRAMUSCULAR | Status: DC
  Administered 2017-07-24 – 2017-07-25 (×5): 5000 [IU] via SUBCUTANEOUS
  Filled 2017-07-23 (×5): qty 1

## 2017-07-23 MED ORDER — IPRATROPIUM-ALBUTEROL 0.5-2.5 (3) MG/3ML IN SOLN
3.0000 mL | Freq: Four times a day (QID) | RESPIRATORY_TRACT | Status: DC
  Administered 2017-07-24 – 2017-07-25 (×5): 3 mL via RESPIRATORY_TRACT
  Filled 2017-07-23 (×7): qty 3

## 2017-07-23 MED ORDER — LATANOPROST 0.005 % OP SOLN
1.0000 [drp] | Freq: Every day | OPHTHALMIC | Status: DC
  Administered 2017-07-24: 1 [drp] via OPHTHALMIC
  Filled 2017-07-23: qty 2.5

## 2017-07-23 MED ORDER — ONDANSETRON HCL 4 MG/2ML IJ SOLN: 4.0000 mg | Freq: Four times a day (QID) | INTRAMUSCULAR | Status: DC | PRN

## 2017-07-23 MED ORDER — OXYCODONE HCL 5 MG PO TABS: 10.0000 mg | ORAL_TABLET | Freq: Every day | ORAL | Status: DC | PRN

## 2017-07-23 MED ORDER — PREDNISOLONE ACETATE 1 % OP SUSP
1.0000 [drp] | Freq: Every day | OPHTHALMIC | Status: DC
  Administered 2017-07-24 – 2017-07-25 (×2): 1 [drp] via OPHTHALMIC
  Filled 2017-07-23 (×3): qty 1

## 2017-07-23 MED ORDER — SIMVASTATIN 20 MG PO TABS
20.0000 mg | ORAL_TABLET | Freq: Every day | ORAL | Status: DC
  Administered 2017-07-24: 16:00:00 20 mg via ORAL
  Filled 2017-07-23: qty 1

## 2017-07-23 MED ORDER — SODIUM CHLORIDE 0.9 % IV SOLN
INTRAVENOUS | Status: DC
  Administered 2017-07-24 – 2017-07-25 (×3): via INTRAVENOUS

## 2017-07-23 MED ORDER — ACETAMINOPHEN 325 MG PO TABS: 650.0000 mg | ORAL_TABLET | Freq: Four times a day (QID) | ORAL | Status: DC | PRN

## 2017-07-23 MED ORDER — TRAZODONE HCL 50 MG PO TABS: 25.0000 mg | ORAL_TABLET | Freq: Every evening | ORAL | Status: DC | PRN

## 2017-07-23 MED ORDER — ASPIRIN EC 325 MG PO TBEC
325.0000 mg | DELAYED_RELEASE_TABLET | Freq: Every day | ORAL | Status: DC
  Administered 2017-07-24 – 2017-07-25 (×2): 325 mg via ORAL
  Filled 2017-07-23 (×3): qty 1

## 2017-07-23 NOTE — ED Triage Notes (Signed)
SOB since yesterday. Frequent cough.

## 2017-07-23 NOTE — H&P (Signed)
[place] [facility] [place] NAME: Johnathan [name]    MR#:  [id number]  DATE OF BIRTH:  Jun 06, 1925  DATE OF ADMISSION:  07/23/2017  PRIMARY CARE PHYSICIAN: Jerrol Banana., MD   REQUESTING/REFERRING PHYSICIAN:   CHIEF COMPLAINT:   Chief Complaint  Patient presents with  . Shortness of Breath    HISTORY OF PRESENT ILLNESS: Johnathan [name]  is a 84 y.o. male with a known history of CAD, COPD, CKD, hypertension. Patient presented to emergency room for productive cough and shortness of breath going on for the past 24 to 48 hours, gradually getting worse.  His symptoms are getting worse with exertion.  He denies any fever or chills, no chest pain, no N/V/D.  At the arrival to emergency room, his oxygen saturation was 84% on room air. Patient had a similar episode approximately 3 to 4 weeks ago and he was treated as outpatient with prednisone and azithromycin, which did help at that time. Per patient's family, he is snoring at night and he has periods where he stops breathing during the night. Blood test done emergency room are remarkable for creatinine level of 1.31, which is the baseline for this patient.  WBC is 10,000.   BNP is 450.  Troponin level is less than 0.03. EKG shows atrial flutter with a heart rate at 68 bpm.  Normal axis and intervals.  No acute ST-T changes. Chest x-ray, reviewed by myself, reveals left upper lobe infiltrate. Patient is admitted for further evaluation and treatment.    PAST MEDICAL HISTORY:   Past Medical History:  Diagnosis Date  . Abdominal pain    INTERMITTENT  . ARMD (age related macular degeneration)   . Arthritis    hands  . Asthma   . Atherosclerosis of native arteries of the extremities, unspecified   . CAD (coronary artery disease)    3 vessel/ DR FATH CARDIOLOGIST GAVE CLEARANCE  . Colitis    HX OF  . Constipation   . COPD (chronic obstructive pulmonary disease) ([place])   . GERD (gastroesophageal  reflux disease)   . Glaucoma    both eyes/ PATIENT CAN NOT SEE  . HOH (hard of hearing)   . Hyperlipidemia   . Hypertension   . Neuromuscular disorder (H[facility])    numbness in feet  . Occlusion and stenosis of carotid artery without mention of cerebral infarction   . Prostate cancer ([place])   . Prostate pain   . Shortness of breath dyspnea   . TIA (transient ischemic attack)    hx of/ 10 yrs ago  . Vertigo   . Wears dentures    upper and lower    PAST SURGICAL HISTORY:  Past Surgical History:  Procedure Laterality Date  . aortoiliac bypass   1986  . CAROTID ENDARTERECTOMY Right 2003  . CATARACT EXTRACTION    . COLONOSCOPY  02-06-13   Dr Candace Cruise  . COLONOSCOPY WITH PROPOFOL N/A 07/09/2015   Procedure: COLONOSCOPY WITH PROPOFOL;  Surgeon: Hulen Luster, MD;  Location: [facility];  Service: Gastroenterology;  Laterality: N/A;  . CORONARY ARTERY BYPASS GRAFT  2000   x3  . HERNIA REPAIR  1992  . PROSTATE SURGERY  2001    SOCIAL HISTORY:  Social History   Tobacco Use  . Smoking status: Former Smoker    Packs/day: 1.00    Years: 30.00    Pack years: 30.00    Types: Cigarettes    Last attempt  to quit: 02/16/2003    Years since quitting: 14.4  . Smokeless tobacco: Never Used  . Tobacco comment: is exposed to second hand smoke  Substance Use Topics  . Alcohol use: No    FAMILY HISTORY:  Family History  Problem Relation Age of Onset  . Cirrhosis Mother        liver  . Heart attack Father   . Prostate cancer Paternal Uncle   . Kidney disease Neg Hx   . Kidney cancer Neg Hx   . Bladder Cancer Neg Hx     DRUG ALLERGIES:  Allergies  Allergen Reactions  . Sulfa Antibiotics Other (See Comments)    rash  . Penicillins Rash    Has patient had a PCN reaction causing immediate rash, facial/tongue/throat swelling, SOB or lightheadedness with hypotension: No Has patient had a PCN reaction causing severe rash involving mucus membranes or skin necrosis: No Has patient had a PCN  reaction that required hospitalization: No Has patient had a PCN reaction occurring within the last 10 years: No If all of the above answers are "NO", then may proceed with Cephalosporin use.     REVIEW OF SYSTEMS:   CONSTITUTIONAL: No fever, the patient complains of fatigue and generalized weakness.  EYES: No vision changes.  EARS, NOSE, AND THROAT: No tinnitus or ear pain.  RESPIRATORY: Positive for productive cough, shortness of breath and wheezing. No hemoptysis.  CARDIOVASCULAR: No chest pain, orthopnea, edema.  GASTROINTESTINAL: No nausea, vomiting, diarrhea or abdominal pain.  GENITOURINARY: No dysuria, hematuria.  ENDOCRINE: No polyuria, nocturia,  HEMATOLOGY: No bleeding SKIN: No rash or lesion. MUSCULOSKELETAL: No joint pain at this time.   NEUROLOGIC: No focal weakness.  PSYCHIATRY: No anxiety or depression.   MEDICATIONS AT HOME:  Prior to Admission medications   Medication Sig Start Date End Date Taking? Authorizing Provider  albuterol (ACCUNEB) 0.63 MG/3ML nebulizer solution Take 3 mLs (0.63 mg total) by nebulization every 4 (four) hours as needed for wheezing. J44.9 06/28/17   Laverle Hobby, MD  albuterol (PROVENTIL HFA;VENTOLIN HFA) 108 (90 Base) MCG/ACT inhaler Inhale 2 puffs into the lungs every 6 (six) hours as needed for wheezing or shortness of breath. 03/13/17   Nicholes Mango, MD  AMBULATORY NON FORMULARY MEDICATION Medication Name: Flutter valve DX:J44.9 03/17/17   Laverle Hobby, MD  amLODipine (NORVASC) 5 MG tablet TAKE 1 TABLET DAILY 04/21/17   Jerrol Banana., MD  aspirin 325 MG EC tablet Take 325 mg by mouth daily.    [provider]  brimonidine (ALPHAGAN) 0.2 % ophthalmic solution Place 1 drop into both eyes 2 (two) times daily.     [provider]  esomeprazole (NEXIUM) 20 MG capsule TAKE 1 CAPSULE TWICE A DAY 04/27/17   Jerrol Banana., MD  Fluticasone-Salmeterol (ADVAIR DISKUS) 250-50 MCG/DOSE AEPB Inhale 1 puff  into the lungs 2 (two) times daily. 06/17/17 06/17/18  Laverle Hobby, MD  latanoprost (XALATAN) 0.005 % ophthalmic solution Place 1 drop into both eyes at bedtime.  01/25/13   [provider]  Polyethylene Glycol 3350 (MIRALAX PO) Take by mouth as needed.    [provider]  prednisoLONE acetate (PRED FORTE) 1 % ophthalmic suspension Place 1 drop into the left eye daily.     [provider]  simvastatin (ZOCOR) 20 MG tablet TAKE 1 TABLET DAILY 07/26/16   Jerrol Banana., MD  Tiotropium Bromide Monohydrate (SPIRIVA RESPIMAT) 2.5 MCG/ACT AERS Inhale 2 puffs into the lungs daily. 04/28/17  Laverle Hobby, MD  traZODone (DESYREL) 50 MG tablet Take 0.5-1 tablets (25-50 mg total) by mouth at bedtime as needed for sleep. 06/01/16   Jerrol Banana., MD      PHYSICAL EXAMINATION:   VITAL SIGNS: Blood pressure (!) 150/75, pulse 74, temperature 97.6 F (36.4 C), temperature source Oral, resp. rate 20, height 5\' 8"  (1.727 m), weight 99.8 kg (220 lb), SpO2 94 %.  GENERAL:  84 y.o.-year-old patient lying in the bed with mild respiratory distress, status post nebulizer treatment.[name]  EYES: Pupils equal, round, reactive to light and accommodation. No scleral icterus.  HEENT: Head atraumatic, normocephalic. Oropharynx and nasopharynx clear.  NECK:  Supple, no jugular venous distention. No thyroid enlargement, no tenderness.  LUNGS: Reduced breath sounds and scattered wheezing, bilaterally. No use of accessory muscles of respiration.  CARDIOVASCULAR: S1, S2 normal. No S3/S4.  ABDOMEN: Soft, nontender, nondistended. Bowel sounds present. No organomegaly or mass.  EXTREMITIES: No pedal edema, cyanosis, or clubbing.  NEUROLOGIC: No focal weakness. PSYCHIATRIC: The patient is alert and oriented x 3.  SKIN: No obvious rash, lesion, or ulcer.   LABORATORY PANEL:   CBC Recent Labs  Lab 07/23/17 1823  WBC 10.0  HGB 14.9  HCT 44.4  PLT 214  MCV 93.0  MCH  31.3  MCHC 33.6  RDW 15.4*   ------------------------------------------------------------------------------------------------------------------  Chemistries  Recent Labs  Lab 07/23/17 1823  NA 138  K 4.6  CL 103  CO2 24  GLUCOSE 105*  BUN 20  CREATININE 1.31*  CALCIUM 9.0  AST 24  ALT 15*  ALKPHOS 80  BILITOT 0.7   ------------------------------------------------------------------------------------------------------------------ estimated creatinine clearance is 43.8 mL/min (A) (by C-G formula based on SCr of 1.31 mg/dL (H)). ------------------------------------------------------------------------------------------------------------------ No results for input(s): TSH, T4TOTAL, T3FREE, THYROIDAB in the last 72 hours.  Invalid input(s): FREET3   Coagulation profile No results for input(s): INR, PROTIME in the last 168 hours. ------------------------------------------------------------------------------------------------------------------- No results for input(s): DDIMER in the last 72 hours. -------------------------------------------------------------------------------------------------------------------  Cardiac Enzymes Recent Labs  Lab 07/23/17 1823  TROPONINI <0.03   ------------------------------------------------------------------------------------------------------------------ Invalid input(s): POCBNP  ---------------------------------------------------------------------------------------------------------------  Urinalysis    Component Value Date/Time   COLORURINE YELLOW (A) 08/24/2016 1429   APPEARANCEUR Clear 04/26/2017 1522   LABSPEC 1.014 08/24/2016 1429   LABSPEC 1.051 02/05/2013 0038   PHURINE 6.0 08/24/2016 1429   GLUCOSEU Negative 04/26/2017 1522   GLUCOSEU Negative 02/05/2013 0038   HGBUR NEGATIVE 08/24/2016 1429   BILIRUBINUR Negative 04/26/2017 1522   BILIRUBINUR Negative 02/05/2013 0038   KETONESUR NEGATIVE 08/24/2016 1429   PROTEINUR  Negative 04/26/2017 1522   PROTEINUR NEGATIVE 08/24/2016 1429   NITRITE Negative 04/26/2017 1522   NITRITE NEGATIVE 08/24/2016 1429   LEUKOCYTESUR Negative 04/26/2017 1522   LEUKOCYTESUR Negative 02/05/2013 0038     RADIOLOGY: Dg Chest 2 View  Result Date: 07/23/2017 CLINICAL DATA:  Shortness of breath and cough for 2 days EXAM: CHEST - 2 VIEW COMPARISON:  05/14/2017 FINDINGS: Enlargement of cardiac silhouette post CABG. Mediastinal contours and pulmonary vascularity normal. Bronchitic changes with new LEFT upper lobe infiltrate consistent with pneumonia. Chronic accentuation of bibasilar markings. Remaining lungs clear. No pleural effusion or pneumothorax. Bones demineralized. IMPRESSION: Enlargement of cardiac silhouette post CABG. Bronchitic changes with new LEFT upper lobe infiltrate consistent with pneumonia. Electronically Signed   By: Lavonia Dana M.D.   On: 07/23/2017 19:59    EKG: Orders placed or performed during the hospital encounter of 07/23/17  . EKG 12-Lead  . EKG 12-Lead  .  ED EKG within 10 minutes  . ED EKG within 10 minutes    IMPRESSION AND PLAN:  1.  Acute respiratory failure with hypoxia, secondary to pneumonia and COPD exacerbation. Will treat with steroids, nebulizer treatments, oxygen therapy and antibiotics. 2.  Acute COPD exacerbation.  Will treat with steroids, nebulizer treatments, oxygen therapy and antibiotics. 3.  Left upper lobe pneumonia.  Patient was recently treated with prednisone and a azithromycin.  We will start stronger antibiotics this time, cefepime and vancomycin IV. 4.  CKD 3.  Creatinine is stable at 1.31.  The need to monitor kidney function closely and avoid nephrotoxic medications. 5.  Sleep apnea.  We will use CPAP nightly while in the hospital.  Patient advised to undergo formal sleep study as outpatient.  Pulmonary is consulted for further evaluation and treatment. 6.  Hypertension, stable we will restart home medications.  All the  records are reviewed and case discussed with ED provider. Management plans discussed with the patient, family and they are in agreement.  CODE STATUS: Code Status History    Date Active Date Inactive Code Status Order ID Comments User Context   03/11/2017 1418 03/13/2017 1707 Full Code [id number]  Bettey Costa, MD ED   05/23/2016 2319 05/24/2016 2056 Full Code [id number]  Lance Coon, MD Inpatient       TOTAL TIME TAKING CARE OF THIS PATIENT: 50 minutes.    Amelia Jo M.D on 07/23/2017 at 9:34 PM  Between 7am to 6pm - Pager - [phone]  After 6pm go to [email or web address] - password [email or web address] [facility]  [facility]  Office  [phone]  CC: Primary care physician; Jerrol Banana., MD

## 2017-07-23 NOTE — ED Provider Notes (Signed)
[facility] Emergency Department Provider Note  ____________________________________________  Time seen: Approximately 8:38 PM  I have reviewed the triage vital signs and the nursing notes.   HISTORY  Chief Complaint Shortness of Breath    HPI Johnathan [name] is a 84 y.o. male with a history of CAD, hypertension, COPD, not on home oxygen, who complains of shortness of breath since yesterday.  Gradual onset, constant, severe.  Worse lying flat, no alleviating factors.  Not exertional, no chest pain.  No fever chills or sweats.  No weight changes.  Denies peripheral edema.  He saw his pulmonologist 4 weeks ago with similar symptoms, was given prednisone and azithromycin.  Symptoms improved in the meantime but have recurred since yesterday.      Past Medical History:  Diagnosis Date  . Abdominal pain    INTERMITTENT  . ARMD (age related macular degeneration)   . Arthritis    hands  . Asthma   . Atherosclerosis of native arteries of the extremities, unspecified   . CAD (coronary artery disease)    3 vessel/ DR FATH CARDIOLOGIST GAVE CLEARANCE  . Colitis    HX OF  . Constipation   . COPD (chronic obstructive pulmonary disease) ([place])   . GERD (gastroesophageal reflux disease)   . Glaucoma    both eyes/ PATIENT CAN NOT SEE  . HOH (hard of hearing)   . Hyperlipidemia   . Hypertension   . Neuromuscular disorder (HCC)    numbness in feet  . Occlusion and stenosis of carotid artery without mention of cerebral infarction   . Prostate cancer ([place])   . Prostate pain   . Shortness of breath dyspnea   . TIA (transient ischemic attack)    hx of/ 10 yrs ago  . Vertigo   . Wears dentures    upper and lower     Patient Active Problem List   Diagnosis Date Noted  . Asthma exacerbation 03/11/2017  . Vertigo 06/01/2016  . Dizziness 05/23/2016  . Renal artery stenosis ([place]) 12/29/2015  . Aneurysm of thoracic aorta ([place]) 12/29/2015  . Superior mesenteric  artery stenosis ([place]) 12/29/2015  . Hydrocele, bilateral 09/09/2014  . Hydrocele sac 08/22/2014  . History of prostate cancer 08/22/2014  . ASCVD (arteriosclerotic cardiovascular disease) 08/06/2014  . Cardiomyopathy, ischemic 08/06/2014  . Prostate cancer ([place]) 08/06/2014  . Obesity 08/06/2014  . Insomnia 08/06/2014  . Neuropathy 08/06/2014  . Abdominal aortic aneurysm ([place]) 08/06/2014  . COPD (chronic obstructive pulmonary disease) ([place]) 08/06/2014  . Hyperglycemia 08/06/2014  . Acid reflux 08/05/2014  . Age-related macular degeneration 08/05/2014  . Branch retinal vein occlusion 08/05/2014  . 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 Hypercholesteremia 08/05/2014  . Pseudoaphakia 08/05/2014  . Secondary glaucoma 08/05/2014  . Osteoarthritis 08/05/2014  . Carotid stenosis 04/02/2014  . PAD (peripheral artery disease) ([place]) 04/02/2014  . 3-vessel CAD 11/06/2013  . Colonic stricture ([place]) 02/21/2013  . BP (high blood pressure) 11/22/2012  . Kidney failure 11/21/2012  . BK (bullous keratopathy) 06/28/2012     Past Surgical History:  Procedure Laterality Date  . aortoiliac bypass   1986  . CAROTID ENDARTERECTOMY Right 2003  . CATARACT EXTRACTION    . COLONOSCOPY  02-06-13   Dr Candace Cruise  . COLONOSCOPY WITH PROPOFOL N/A 07/09/2015   Procedure: COLONOSCOPY WITH PROPOFOL;  Surgeon: Hulen Luster, MD;  Location: [facility];  Service: Gastroenterology;  Laterality: N/A;  . CORONARY ARTERY BYPASS GRAFT  2000   x3  . HERNIA  REPAIR  1992  . PROSTATE SURGERY  2001     Prior to Admission medications   Medication Sig Start Date End Date Taking? Authorizing Provider  albuterol (ACCUNEB) 0.63 MG/3ML nebulizer solution Take 3 mLs (0.63 mg total) by nebulization every 4 (four) hours as needed for wheezing. J44.9 06/28/17   Laverle Hobby, MD  albuterol (PROVENTIL HFA;VENTOLIN HFA) 108 (90 Base) MCG/ACT inhaler Inhale 2 puffs into the lungs every 6 (six) hours as needed for wheezing or  shortness of breath. 03/13/17   Nicholes Mango, MD  AMBULATORY NON FORMULARY MEDICATION Medication Name: Flutter valve DX:J44.9 03/17/17   Laverle Hobby, MD  amLODipine (NORVASC) 5 MG tablet Take 1 tablet (5 mg total) by mouth daily. 10/05/16   Jerrol Banana., MD  amLODipine (NORVASC) 5 MG tablet TAKE 1 TABLET DAILY 04/21/17   Jerrol Banana., MD  aspirin 325 MG EC tablet Take 325 mg by mouth daily.    [provider]  azithromycin (ZITHROMAX) 250 MG tablet Take 1 tablet (250 mg total) by mouth daily. Take 2 tablets the first day, then once daily until gone. 06/17/17   Laverle Hobby, MD  brimonidine (ALPHAGAN) 0.2 % ophthalmic solution Place 1 drop into both eyes 2 (two) times daily.     [provider]  esomeprazole (NEXIUM) 20 MG capsule TAKE 1 CAPSULE TWICE A DAY 04/27/17   Jerrol Banana., MD  Fluticasone-Salmeterol (ADVAIR DISKUS) 100-50 MCG/DOSE AEPB Inhale 1 puff into the lungs 2 (two) times daily. 04/28/17 04/28/18  Laverle Hobby, MD  Fluticasone-Salmeterol (ADVAIR DISKUS) 250-50 MCG/DOSE AEPB Inhale 1 puff into the lungs 2 (two) times daily. 06/17/17 06/17/18  Laverle Hobby, MD  furosemide (LASIX) 20 MG tablet Take 1 tablet (20 mg total) by mouth daily. 05/14/17 05/14/18  Earleen [place], MD  latanoprost (XALATAN) 0.005 % ophthalmic solution Place 1 drop into both eyes at bedtime.  01/25/13   [provider]  Polyethylene Glycol 3350 (MIRALAX PO) Take by mouth as needed.    [provider]  prednisoLONE acetate (PRED FORTE) 1 % ophthalmic suspension Place 1 drop into the left eye daily.     [provider]  predniSONE (STERAPRED UNI-PAK 21 TAB) 10 MG (21) TBPK tablet Take as directed. 06/17/17   Laverle Hobby, MD  simvastatin (ZOCOR) 20 MG tablet TAKE 1 TABLET DAILY 07/26/16   Jerrol Banana., MD  Tiotropium Bromide Monohydrate (SPIRIVA RESPIMAT) 2.5 MCG/ACT AERS Inhale 2 puffs into the lungs  daily. 04/28/17   Laverle Hobby, MD  traZODone (DESYREL) 50 MG tablet Take 0.5-1 tablets (25-50 mg total) by mouth at bedtime as needed for sleep. 06/01/16   Jerrol Banana., MD     Allergies Sulfa antibiotics and Penicillins   Family History  Problem Relation Age of Onset  . Cirrhosis Mother        liver  . Heart attack Father   . Prostate cancer Paternal Uncle   . Kidney disease Neg Hx   . Kidney cancer Neg Hx   . Bladder Cancer Neg Hx     Social History Social History   Tobacco Use  . Smoking status: Former Smoker    Packs/day: 1.00    Years: 30.00    Pack years: 30.00    Types: Cigarettes    Last attempt to quit: 02/16/2003    Years since quitting: 14.4  . Smokeless tobacco: Never Used  . Tobacco comment: is exposed to second hand smoke  Substance  Use Topics  . Alcohol use: No  . Drug use: No    Review of Systems  Constitutional:   No fever or chills.  ENT:   No sore throat. No rhinorrhea. Cardiovascular:   No chest pain or syncope. Respiratory: Positive shortness of breath and productive cough. Gastrointestinal:   Negative for abdominal pain, vomiting and diarrhea.  Musculoskeletal:   Negative for focal pain or swelling All other systems reviewed and are negative except as documented above in ROS and HPI.  ____________________________________________   PHYSICAL EXAM:  VITAL SIGNS: ED Triage Vitals  Enc Vitals Group     BP 07/23/17 1810 (!) 149/88     Pulse Rate 07/23/17 1810 87     Resp 07/23/17 1810 20     Temp 07/23/17 1810 97.6 F (36.4 C)     Temp Source 07/23/17 1810 Oral     SpO2 07/23/17 1810 93 %     Weight 07/23/17 1810 220 lb (99.8 kg)     Height 07/23/17 1810 5\' 8"  (1.727 m)     Head Circumference --      Peak Flow --      Pain Score 07/23/17 1821 0     Pain Loc --      Pain Edu? --      Excl. in [place]? --     Vital signs reviewed, nursing assessments reviewed.   Constitutional:   Alert and oriented.   Ill-appearing Eyes:   Conjunctivae are normal. EOMI. PERRL. ENT      Head:   Normocephalic and atraumatic.      Nose:   No congestion/rhinnorhea.       Mouth/Throat:   MMM, no pharyngeal erythema. No peritonsillar mass.       Neck:   No meningismus. Full ROM. Hematological/Lymphatic/Immunilogical:   No cervical lymphadenopathy. Cardiovascular:   RRR. Symmetric bilateral radial and DP pulses.  No murmurs.  Respiratory:   Tachypnea, accessory muscle use.  Increased work of breathing.  Diffuse expiratory wheezing with prolonged expiratory phase.  No focal crackles. Gastrointestinal:   Soft and nontender. Non distended. There is no CVA tenderness.  No rebound, rigidity, or guarding.  Musculoskeletal:   Normal range of motion in all extremities. No joint effusions.  No lower extremity tenderness.  No edema. Neurologic:   Normal speech and language.  Motor grossly intact. No acute focal neurologic deficits are appreciated.  Skin:    Skin is warm, dry and intact. No rash noted.  No petechiae, purpura, or bullae.  ____________________________________________    LABS (pertinent positives/negatives) (all labs ordered are listed, but only abnormal results are displayed) Labs Reviewed  CBC - Abnormal; Notable for the following components:      Result Value   RDW 15.4 (*)    All other components within normal limits  COMPREHENSIVE METABOLIC PANEL - Abnormal; Notable for the following components:   Glucose, Bld 105 (*)    Creatinine, Ser 1.31 (*)    ALT 15 (*)    GFR calc non Af Amer 47 (*)    GFR calc Af Amer 54 (*)    All other components within normal limits  BRAIN NATRIURETIC PEPTIDE - Abnormal; Notable for the following components:   B Natriuretic Peptide 450.0 (*)    All other components within normal limits  TROPONIN I   ____________________________________________   EKG  Interpreted by me Atrial flutter, rate of 68.  Normal axis and intervals.  Normal ST segments and T  waves.  ____________________________________________  RADIOLOGY  Dg Chest 2 View  Result Date: 07/23/2017 CLINICAL DATA:  Shortness of breath and cough for 2 days EXAM: CHEST - 2 VIEW COMPARISON:  05/14/2017 FINDINGS: Enlargement of cardiac silhouette post CABG. Mediastinal contours and pulmonary vascularity normal. Bronchitic changes with new LEFT upper lobe infiltrate consistent with pneumonia. Chronic accentuation of bibasilar markings. Remaining lungs clear. No pleural effusion or pneumothorax. Bones demineralized. IMPRESSION: Enlargement of cardiac silhouette post CABG. Bronchitic changes with new LEFT upper lobe infiltrate consistent with pneumonia. Electronically Signed   By: Lavonia Dana M.D.   On: 07/23/2017 19:59    ____________________________________________   PROCEDURES Procedures  ____________________________________________  DIFFERENTIAL DIAGNOSIS   COPD exacerbation, pneumonia, pneumothorax  CLINICAL IMPRESSION / ASSESSMENT AND PLAN / ED COURSE  Pertinent labs & imaging results that were available during my care of the patient were reviewed by me and considered in my medical decision making (see chart for details).    Patient presents with recurrent shortness of breath, productive cough since yesterday.  Recently treated for COPD exacerbation with prednisone and azithromycin about 4 weeks ago by his pulmonologist.  I will obtain labs and a chest x-ray.  Give albuterol and DuoNeb and Solu-Medrol IV for symptomatic support.  Clinical Course as of Jul 23 2044  Sat Jul 23, 2017  2037 Chest x-ray shows a new left upper lobe infiltrate.  Given his underlying COPD and recent treatment with azithromycin, I will treat him for healthcare associated pneumonia with vancomycin and cefepime and admitted to the hospital.   [PS]    Clinical Course User Index [PS] Carrie Mew, MD     ____________________________________________   FINAL CLINICAL IMPRESSION(S) / ED  DIAGNOSES    Final diagnoses:  HCAP (healthcare-associated pneumonia)  Pneumonia of left upper lobe due to infectious organism [facility])  COPD exacerbation [facility])     ED Discharge Orders    None      Portions of this note were generated with dragon dictation software. Dictation errors may occur despite best attempts at proofreading.    Carrie Mew, MD 07/23/17 2047

## 2017-07-23 NOTE — ED Notes (Signed)
Report called to [facility]

## 2017-07-24 DIAGNOSIS — J441 Chronic obstructive pulmonary disease with (acute) exacerbation: Secondary | ICD-10-CM

## 2017-07-24 LAB — BASIC METABOLIC PANEL
Anion gap: 8 (ref 5–15)
BUN: 19 mg/dL (ref 6–20)
CO2: 22 mmol/L (ref 22–32)
Calcium: 8.6 mg/dL — ABNORMAL LOW (ref 8.9–10.3)
Chloride: 107 mmol/L (ref 101–111)
Creatinine, Ser: 1.17 mg/dL (ref 0.61–1.24)
GFR calc Af Amer: >60 mL/min (ref >60)
GFR calc non Af Amer: 53 mL/min — ABNORMAL LOW (ref >60)
Glucose, Bld: 161 mg/dL — ABNORMAL HIGH (ref 65–99)
Potassium: 4.3 mmol/L (ref 3.5–5.1)
Sodium: 137 mmol/L (ref 135–145)

## 2017-07-24 LAB — CBC
HCT: 42.1 % (ref 40.0–52.0)
Hemoglobin: 14.6 g/dL (ref 13.0–18.0)
MCH: 32.1 pg (ref 26.0–34.0)
MCHC: 34.6 g/dL (ref 32.0–36.0)
MCV: 92.8 fL (ref 80.0–100.0)
Platelets: 189 10*3/uL (ref 150–440)
RBC: 4.53 MIL/uL (ref 4.40–5.90)
RDW: 16.1 % — ABNORMAL HIGH (ref 11.5–14.5)
WBC: 6.2 10*3/uL (ref 3.8–10.6)

## 2017-07-24 LAB — MRSA PCR SCREENING: MRSA by PCR: NEGATIVE

## 2017-07-24 LAB — STREP PNEUMONIAE URINARY ANTIGEN: Strep Pneumo Urinary Antigen: NEGATIVE

## 2017-07-24 MED ORDER — SODIUM CHLORIDE 0.9 % IV SOLN
1500.0000 mg | INTRAVENOUS | Status: DC
  Administered 2017-07-24: 06:00:00 1500 mg via INTRAVENOUS
  Filled 2017-07-24: qty 1500

## 2017-07-24 MED ORDER — FUROSEMIDE 10 MG/ML IJ SOLN
40.0000 mg | Freq: Once | INTRAMUSCULAR | Status: AC
  Administered 2017-07-24: 09:00:00 40 mg via INTRAVENOUS
  Filled 2017-07-24: qty 4

## 2017-07-24 MED ORDER — SODIUM CHLORIDE 0.9 % IV SOLN
2.0000 g | INTRAVENOUS | Status: DC
  Administered 2017-07-24: 2 g via INTRAVENOUS
  Filled 2017-07-24 (×2): qty 2

## 2017-07-24 MED ORDER — METHYLPREDNISOLONE SODIUM SUCC 125 MG IJ SOLR
60.0000 mg | Freq: Every day | INTRAMUSCULAR | Status: DC
  Filled 2017-07-24: qty 2

## 2017-07-24 MED ORDER — METHYLPREDNISOLONE SODIUM SUCC 125 MG IJ SOLR
60.0000 mg | Freq: Four times a day (QID) | INTRAMUSCULAR | Status: DC
  Administered 2017-07-24: 60 mg via INTRAVENOUS
  Filled 2017-07-24: qty 2

## 2017-07-24 NOTE — Consult Note (Signed)
Reason for Consult:pneumonia Referring Physician: Dr. Patel  Johnathan [name] is an 84 y.o. male.  HPI: Mr. Johnathan [name] is an 84-year-old gentleman with a past medical history remarkable for COPD, hypertension, hyperlipidemia, prostate cancer, coronary artery disease gastroesophageal reflux disease, colitis, macular degeneration, glaucoma, neuropathy, developed worsening shortness of breath on Friday, increased wheezing, cough, congestion with sputum production. His followed by Dr. Ramachandran in the outpatient setting. His home regimen includes Spiriva, Advair with rescue albuterol. The patient is not on home oxygen. His daughter is present and relates that he has had episodes of snoring, sleep maintenance insomnia, frequent nocturnal arousals, daytime somnolence concerning for a diagnosis of obstructive sleep apnea  Past Medical History:  Diagnosis Date  . Abdominal pain    INTERMITTENT  . ARMD (age related macular degeneration)   . Arthritis    hands  . Asthma   . Atherosclerosis of native arteries of the extremities, unspecified   . CAD (coronary artery disease)    3 vessel/ DR FATH CARDIOLOGIST GAVE CLEARANCE  . Colitis    HX OF  . Constipation   . COPD (chronic obstructive pulmonary disease) (HCC)   . GERD (gastroesophageal reflux disease)   . Glaucoma    both eyes/ PATIENT CAN NOT SEE  . HOH (hard of hearing)   . Hyperlipidemia   . Hypertension   . Neuromuscular disorder (HCC)    numbness in feet  . Occlusion and stenosis of carotid artery without mention of cerebral infarction   . Prostate cancer (HCC)   . Prostate pain   . Shortness of breath dyspnea   . TIA (transient ischemic attack)    hx of/ 10 yrs ago  . Vertigo   . Wears dentures    upper and lower    Past Surgical History:  Procedure Laterality Date  . aortoiliac bypass   1986  . CAROTID ENDARTERECTOMY Right 2003  . CATARACT EXTRACTION    . COLONOSCOPY  02-06-13   Dr Oh  . COLONOSCOPY WITH PROPOFOL N/A 07/09/2015    Procedure: COLONOSCOPY WITH PROPOFOL;  Surgeon: Paul Y Oh, MD;  Location: [facility];  Service: Gastroenterology;  Laterality: N/A;  . CORONARY ARTERY BYPASS GRAFT  2000   x3  . HERNIA REPAIR  1992  . PROSTATE SURGERY  2001    Family History  Problem Relation Age of Onset  . Cirrhosis Mother        liver  . Heart attack Father   . Prostate cancer Paternal Uncle   . Kidney disease Neg Hx   . Kidney cancer Neg Hx   . Bladder Cancer Neg Hx     Social History:  reports that he quit smoking about 14 years ago. His smoking use included cigarettes. He has a 30.00 pack-year smoking history. He has never used smokeless tobacco. He reports that he does not drink alcohol or use drugs.  Allergies:  Allergies  Allergen Reactions  . Sulfa Antibiotics Other (See Comments)    rash  . Penicillins Rash    Has patient had a PCN reaction causing immediate rash, facial/tongue/throat swelling, SOB or lightheadedness with hypotension: No Has patient had a PCN reaction causing severe rash involving mucus membranes or skin necrosis: No Has patient had a PCN reaction that required hospitalization: No Has patient had a PCN reaction occurring within the last 10 years: No If all of the above answers are "NO", then may proceed with Cephalosporin use.     Medications: I have reviewed the patient's   current medications.  Results for orders placed or performed during the hospital encounter of 07/23/17 (from the past 48 hour(s))  CBC     Status: Abnormal   Collection Time: 07/23/17  6:23 PM  Result Value Ref Range   WBC 10.0 3.8 - 10.6 K/uL   RBC 4.77 4.40 - 5.90 MIL/uL   Hemoglobin 14.9 13.0 - 18.0 g/dL   HCT 44.4 40.0 - 52.0 %   MCV 93.0 80.0 - 100.0 fL   MCH 31.3 26.0 - 34.0 pg   MCHC 33.6 32.0 - 36.0 g/dL   RDW 15.4 (H) 11.5 - 14.5 %   Platelets 214 150 - 440 K/uL    Comment: Performed at [facility], [address] [place], [place] [address]  Troponin I     Status: None    Collection Time: 07/23/17  6:23 PM  Result Value Ref Range   Troponin I <0.03 <0.03 ng/mL    Comment: Performed at [facility], [address] [place], [place] [address]  Comprehensive metabolic panel     Status: Abnormal   Collection Time: 07/23/17  6:23 PM  Result Value Ref Range   Sodium 138 135 - 145 mmol/L   Potassium 4.6 3.5 - 5.1 mmol/L   Chloride 103 101 - 111 mmol/L   CO2 24 22 - 32 mmol/L   Glucose, Bld 105 (H) 65 - 99 mg/dL   BUN 20 6 - 20 mg/dL   Creatinine, Ser 1.31 (H) 0.61 - 1.24 mg/dL   Calcium 9.0 8.9 - 10.3 mg/dL   Total Protein 7.6 6.5 - 8.1 g/dL   Albumin 4.3 3.5 - 5.0 g/dL   AST 24 15 - 41 U/L   ALT 15 (L) 17 - 63 U/L   Alkaline Phosphatase 80 38 - 126 U/L   Total Bilirubin 0.7 0.3 - 1.2 mg/dL   GFR calc non Af Amer 47 (L) >60 mL/min   GFR calc Af Amer 54 (L) >60 mL/min    Comment: (NOTE) The eGFR has been calculated using the CKD EPI equation. This calculation has not been validated in all clinical situations. eGFR's persistently <60 mL/min signify possible Chronic Kidney Disease.    Anion gap 11 5 - 15    Comment: Performed at [facility], [address] [place], [place] [address]  Brain natriuretic peptide     Status: Abnormal   Collection Time: 07/23/17  6:23 PM  Result Value Ref Range   B Natriuretic Peptide 450.0 (H) 0.0 - 100.0 pg/mL    Comment: Performed at [facility], [address] [place], [place] [address]  Basic metabolic panel     Status: Abnormal   Collection Time: 07/24/17  5:12 AM  Result Value Ref Range   Sodium 137 135 - 145 mmol/L   Potassium 4.3 3.5 - 5.1 mmol/L   Chloride 107 101 - 111 mmol/L   CO2 22 22 - 32 mmol/L   Glucose, Bld 161 (H) 65 - 99 mg/dL   BUN 19 6 - 20 mg/dL   Creatinine, Ser 1.17 0.61 - 1.24 mg/dL   Calcium 8.6 (L) 8.9 - 10.3 mg/dL   GFR calc non Af Amer 53 (L) >60 mL/min   GFR calc Af Amer >60 >60 mL/min    Comment: (NOTE) The eGFR has been calculated using the CKD EPI  equation. This calculation has not been validated in all clinical situations. eGFR's persistently <60 mL/min signify possible Chronic Kidney Disease.    Anion gap  8 5 - 15    Comment: Performed at [facility], [address] [place], [place] [address]  CBC     Status: Abnormal   Collection Time: 07/24/17  5:12 AM  Result Value Ref Range   WBC 6.2 3.8 - 10.6 K/uL   RBC 4.53 4.40 - 5.90 MIL/uL   Hemoglobin 14.6 13.0 - 18.0 g/dL   HCT 42.1 40.0 - 52.0 %   MCV 92.8 80.0 - 100.0 fL   MCH 32.1 26.0 - 34.0 pg   MCHC 34.6 32.0 - 36.0 g/dL   RDW 16.1 (H) 11.5 - 14.5 %   Platelets 189 150 - 440 K/uL    Comment: Performed at [facility], [address] [place], [place] [address]    Dg Chest 2 View  Result Date: 07/23/2017 CLINICAL DATA:  Shortness of breath and cough for 2 days EXAM: CHEST - 2 VIEW COMPARISON:  05/14/2017 FINDINGS: Enlargement of cardiac silhouette post CABG. Mediastinal contours and pulmonary vascularity normal. Bronchitic changes with new LEFT upper lobe infiltrate consistent with pneumonia. Chronic accentuation of bibasilar markings. Remaining lungs clear. No pleural effusion or pneumothorax. Bones demineralized. IMPRESSION: Enlargement of cardiac silhouette post CABG. Bronchitic changes with new LEFT upper lobe infiltrate consistent with pneumonia. Electronically Signed   By: Lavonia Dana M.D.   On: 07/23/2017 19:59    ROS Blood pressure (!) 151/87, pulse 87, temperature 98.1 F (36.7 C), temperature source Oral, resp. rate 18, height 5' 8" (1.727 m), weight 218 lb 12.8 oz (99.2 kg), SpO2 97 %. Physical Exam Patient is awake, alert, no acute distress Vital signs: Please see the above listed vital signs HEENT: Trachea is midline, no thyromegaly appreciated, no accessory muscle utilization, breathing comfortably Cardiovascular: Irregularly irregular Pulmonary: Prolonged expiratory phase, diffuse wheezing right greater than left Abdominal: Positive  bowel sounds, soft non tender exam Extremity: No clubbing cyanosis or edema noted Neurologic: Moves all extremities cranial nerves grossly intact  Assessment/Plan:  COPD exacerbation patient is on Spiriva, Breo, albuterol and Atrovent nebulizers as needed. Will add Solu-Medrol, patient is presently on vancomycin and cefepime, for left upper lobe pneumonia,would check sputum respiratory culture along with urine for Legionella and pneumococcal antigen.  Presumptive obstructive sleep apnea. Will need outpatient polysomnogram, in the interim you can do overnight oximetry to assess for nocturnal home oxygen until he gets his sleep study  Atrial flutter with varying AV nodal conduction. Would check echocardiography, cardiology consultation presently ventricular response is under control  Hyperglycemia. Sliding scale glycemic control  Macaila Tahir 07/24/2017, 10:07 AM

## 2017-07-24 NOTE — Progress Notes (Signed)
Chaplain provided AD education to the patient and daughter. As patient is visually impaired, his daughter offered to assist in AD completion, with the caveat that the patient would sign and initial the AD as required. Family will complete the document and have the AD notarized on Monday.

## 2017-07-24 NOTE — Progress Notes (Signed)
Pharmacy Antibiotic Note  Johnathan [name] is a 84 y.o. male admitted on 07/23/2017 with pneumonia.  Pharmacy has been consulted for vanc/cefepime dosing.  Plan: Patient received vanc 1g and cefepime 2g IV x 1  Will continue vanc 1.5g IV q24h w/ 6 hour stack Will draw vanc trough on 06/12 @ 0500 prior to 4th dose. Will continue cefepime 2g IV daily  Ke 0.0406 T1/2 17 ~ 24 hrs Goal trough 15 - 20 mcg/mL   Height: 5\' 8"  (172.7 cm) Weight: 218 lb 12.8 oz (99.2 kg) IBW/kg (Calculated) : 68.4  Temp (24hrs), Avg:97.9 F (36.6 C), Min:97.6 F (36.4 C), Max:98.1 F (36.7 C)  Recent Labs  Lab 07/23/17 1823  WBC 10.0  CREATININE 1.31*    Estimated Creatinine Clearance: 43.6 mL/min (A) (by C-G formula based on SCr of 1.31 mg/dL (H)).    Allergies  Allergen Reactions  . Sulfa Antibiotics Other (See Comments)    rash  . Penicillins Rash    Has patient had a PCN reaction causing immediate rash, facial/tongue/throat swelling, SOB or lightheadedness with hypotension: No Has patient had a PCN reaction causing severe rash involving mucus membranes or skin necrosis: No Has patient had a PCN reaction that required hospitalization: No Has patient had a PCN reaction occurring within the last 10 years: No If all of the above answers are "NO", then may proceed with Cephalosporin use.     Thank you for allowing pharmacy to be a part of this patient's care.  Tobie Lords, PharmD, BCPS Clinical Pharmacist 07/24/2017

## 2017-07-24 NOTE — Progress Notes (Signed)
[facility] [place] NAME: Johnathan [name]    MR#:  [id number]  DATE OF BIRTH:  1925/07/30  SUBJECTIVE:  patient came in with increasing shortness of breath and productive cough found to have pneumonia. Feels better. Does not use oxygen at home.  REVIEW OF SYSTEMS:   Review of Systems  Constitutional: Negative for chills, fever and weight loss.  HENT: Negative for ear discharge, ear pain and nosebleeds.   Eyes: Negative for blurred vision, pain and discharge.  Respiratory: Positive for cough and shortness of breath. Negative for sputum production, wheezing and stridor.   Cardiovascular: Negative for chest pain, palpitations, orthopnea and PND.  Gastrointestinal: Negative for abdominal pain, diarrhea, nausea and vomiting.  Genitourinary: Negative for frequency and urgency.  Musculoskeletal: Negative for back pain and joint pain.  Neurological: Positive for weakness. Negative for sensory change, speech change and focal weakness.  Psychiatric/Behavioral: Negative for depression and hallucinations. The patient is not nervous/anxious.    Tolerating Diet:yes Tolerating PT: ambulatory  DRUG ALLERGIES:   Allergies  Allergen Reactions  . Sulfa Antibiotics Other (See Comments)    rash  . Penicillins Rash    Has patient had a PCN reaction causing immediate rash, facial/tongue/throat swelling, SOB or lightheadedness with hypotension: No Has patient had a PCN reaction causing severe rash involving mucus membranes or skin necrosis: No Has patient had a PCN reaction that required hospitalization: No Has patient had a PCN reaction occurring within the last 10 years: No If all of the above answers are "NO", then may proceed with Cephalosporin use.     VITALS:  Blood pressure (!) 151/87, pulse 87, temperature 98.1 F (36.7 C), temperature source Oral, resp. rate 18, height 5\' 8"  (1.727 m), weight 99.2 kg (218 lb 12.8 oz), SpO2 97 %.  PHYSICAL  EXAMINATION:   Physical Exam  GENERAL:  84 y.o.-year-old patient lying in the bed with no acute distress.  EYES: Pupils equal, round, reactive to light and accommodation. No scleral icterus. Extraocular muscles intact.  HEENT: Head atraumatic, normocephalic. Oropharynx and nasopharynx clear.  NECK:  Supple, no jugular venous distention. No thyroid enlargement, no tenderness.  LUNGS: Normal breath sounds bilaterally, no wheezing, rales, rhonchi. No use of accessory muscles of respiration.  CARDIOVASCULAR: S1, S2 normal. No murmurs, rubs, or gallops.  ABDOMEN: Soft, nontender, nondistended. Bowel sounds present. No organomegaly or mass.  EXTREMITIES: No cyanosis, clubbing or edema b/l.    NEUROLOGIC: Cranial nerves II through XII are intact. No focal Motor or sensory deficits b/l.   PSYCHIATRIC:  patient is alert and oriented x 3.  SKIN: No obvious rash, lesion, or ulcer.   LABORATORY PANEL:  CBC Recent Labs  Lab 07/24/17 0512  WBC 6.2  HGB 14.6  HCT 42.1  PLT 189    Chemistries  Recent Labs  Lab 07/23/17 1823 07/24/17 0512  NA 138 137  K 4.6 4.3  CL 103 107  CO2 24 22  GLUCOSE 105* 161*  BUN 20 19  CREATININE 1.31* 1.17  CALCIUM 9.0 8.6*  AST 24  --   ALT 15*  --   ALKPHOS 80  --   BILITOT 0.7  --    Cardiac Enzymes Recent Labs  Lab 07/23/17 1823  TROPONINI <0.03   RADIOLOGY:  Dg Chest 2 View  Result Date: 07/23/2017 CLINICAL DATA:  Shortness of breath and cough for 2 days EXAM: CHEST - 2 VIEW COMPARISON:  05/14/2017 FINDINGS: Enlargement of cardiac silhouette post  CABG. Mediastinal contours and pulmonary vascularity normal. Bronchitic changes with new LEFT upper lobe infiltrate consistent with pneumonia. Chronic accentuation of bibasilar markings. Remaining lungs clear. No pleural effusion or pneumothorax. Bones demineralized. IMPRESSION: Enlargement of cardiac silhouette post CABG. Bronchitic changes with new LEFT upper lobe infiltrate consistent with pneumonia.  Electronically Signed   By: Lavonia Dana M.D.   On: 07/23/2017 19:59   ASSESSMENT AND PLAN:  Johnathan [name]  is a 84 y.o. male with a known history of CAD, COPD, CKD, hypertension. Patient presented to emergency room for productive cough and shortness of breath going on for the past 24 to 48 hours, gradually getting worse.  His symptoms are getting worse with exertion  1.  Acute respiratory failure with hypoxia, secondary to pneumonia and COPD exacerbation. Will treat with steroids, nebulizer treatments, oxygen therapy and antibiotics. -try to wean oxygen off  2.  Acute COPD exacerbation.  Will treat with steroids, nebulizer treatments, oxygen therapy and antibiotics.  3.  Left upper lobe pneumonia.  Patient was recently treated with prednisone and a azithromycin.  We will start stronger antibiotics this time, cefepime  4.  CKD 3.  Creatinine is stable at 1.31.  The need to monitor kidney function closely and avoid nephrotoxic medications.  5.  Sleep apnea.  We will use CPAP nightly while in the hospital.  Patient advised to undergo formal sleep study as outpatient.  Pulmonary is consulted for further evaluation and treatment.  6.  Hypertension, stable we will restart home medications.   Case discussed with Care Management/Social Worker. Management plans discussed with the patient, family and they are in agreement.  CODE STATUS: full  DVT Prophylaxis: lovenox  TOTAL TIME TAKING CARE OF THIS PATIENT: *30* minutes.  >50% time spent on counselling and coordination of care  POSSIBLE D/C IN 1-2* DAYS, DEPENDING ON CLINICAL CONDITION.  Note: This dictation was prepared with Dragon dictation along with smaller phrase technology. Any transcriptional errors that result from this process are unintentional.  Fritzi Mandes M.D on 07/24/2017 at 1:03 PM  Between 7am to 6pm - Pager - [phone]  After 6pm go to [email or web address] - password [email or web address]  [facility]  Office   [phone]  CC: Primary care physician; Jerrol Banana., MDPatient ID: Johnathan [name], male   DOB: 1925-11-06, 84 y.o.   MRN: [id number]

## 2017-07-24 NOTE — Progress Notes (Signed)
Pt declined cpap. States he prefers to have his daughter bring his home unit in.

## 2017-07-25 LAB — LEGIONELLA PNEUMOPHILA SEROGP 1 UR AG: L. pneumophila Serogp 1 Ur Ag: NEGATIVE

## 2017-07-25 LAB — GLUCOSE, CAPILLARY: Glucose-Capillary: 97 mg/dL (ref 65–99)

## 2017-07-25 MED ORDER — PREDNISONE 50 MG PO TABS
50.0000 mg | ORAL_TABLET | Freq: Every day | ORAL | Status: DC
  Administered 2017-07-25: 50 mg via ORAL
  Filled 2017-07-25: qty 1

## 2017-07-25 MED ORDER — PREDNISONE 10 MG PO TABS: ORAL_TABLET | ORAL | 0 refills | Status: DC

## 2017-07-25 MED ORDER — CEFDINIR 300 MG PO CAPS: 300.0000 mg | ORAL_CAPSULE | Freq: Two times a day (BID) | ORAL | 0 refills | Status: DC

## 2017-07-25 MED ORDER — CEFDINIR 300 MG PO CAPS
300.0000 mg | ORAL_CAPSULE | Freq: Two times a day (BID) | ORAL | Status: DC
  Administered 2017-07-25: 300 mg via ORAL
  Filled 2017-07-25 (×4): qty 1

## 2017-07-25 NOTE — Discharge Summary (Signed)
[facility] [place] NAME: Johnathan [name]    MR#:  [id number]  DATE OF BIRTH:  [birth year]  DATE OF ADMISSION:  07/23/2017 ADMITTING PHYSICIAN: Amelia Jo, MD  DATE OF DISCHARGE: 07/25/17  PRIMARY CARE PHYSICIAN: Jerrol Banana., MD    ADMISSION DIAGNOSIS:  COPD exacerbation ([place]) [J44.1] HCAP (healthcare-associated pneumonia) [J18.9] Pneumonia of left upper lobe due to infectious organism ([place]) [J18.1]  DISCHARGE DIAGNOSIS:   Left upper lobe pneumonia community acquired COPD exacerbation SECONDARY DIAGNOSIS:   Past Medical History:  Diagnosis Date  . Abdominal pain    INTERMITTENT  . ARMD (age related macular degeneration)   . Arthritis    hands  . Asthma   . Atherosclerosis of native arteries of the extremities, unspecified   . CAD (coronary artery disease)    3 vessel/ DR FATH CARDIOLOGIST GAVE CLEARANCE  . Colitis    HX OF  . Constipation   . COPD (chronic obstructive pulmonary disease) ([place])   . GERD (gastroesophageal reflux disease)   . Glaucoma    both eyes/ PATIENT CAN NOT SEE  . HOH (hard of hearing)   . Hyperlipidemia   . Hypertension   . Neuromuscular disorder ([facility])    numbness in feet  . Occlusion and stenosis of carotid artery without mention of cerebral infarction   . Prostate cancer ([place])   . Prostate pain   . Shortness of breath dyspnea   . TIA (transient ischemic attack)    hx of/ 10 yrs ago  . Vertigo   . Wears dentures    upper and lower    HOSPITAL COURSE:   RobertFryeis a4 y.o.malewith a known history of CAD, COPD, CKD, hypertension. Patient presented to emergency room for productive cough and shortness of breath going on for the past 24 to 48 hours, gradually getting worse. His symptoms are getting worse with exertion  1.Acute respiratory failure with hypoxia, secondary to pneumonia and COPD exacerbation. Will treat with steroids, nebulizer treatments, oxygen therapy and  antibiotics. -try to wean oxygen off -oxygen saturation 95% on room air  2.Acute COPD exacerbation.Will treat with steroids, nebulizer treatments, oxygen therapy and antibiotics. -No wheezing currently feels better.  3.Left upper lobe pneumonia. Patient was recently treated with prednisone and a azithromycin. -Change to oral ceftin  4.CKD 3. Creatinine is stable at 1.31.The need to monitor kidney function closely and avoid nephrotoxic medications.  5.Sleep apnea. We will use CPAP nightly while in the hospital.Patient advised to undergo formal sleep study as outpatient. Defer workup as outpatient with primary care  6.Hypertension,stable  -continue home medications.  Overall feels better. Will ambulate prior to discharge. Discussed with patient and wife.   CONSULTS OBTAINED:  Treatment Team:  Hermelinda Dellen, DO  DRUG ALLERGIES:   Allergies  Allergen Reactions  . Sulfa Antibiotics Other (See Comments)    rash  . Penicillins Rash    Has patient had a PCN reaction causing immediate rash, facial/tongue/throat swelling, SOB or lightheadedness with hypotension: No Has patient had a PCN reaction causing severe rash involving mucus membranes or skin necrosis: No Has patient had a PCN reaction that required hospitalization: No Has patient had a PCN reaction occurring within the last 10 years: No If all of the above answers are "NO", then may proceed with Cephalosporin use.     DISCHARGE MEDICATIONS:   Allergies as of 07/25/2017      Reactions   Sulfa Antibiotics Other (See Comments)   rash  Penicillins Rash   Has patient had a PCN reaction causing immediate rash, facial/tongue/throat swelling, SOB or lightheadedness with hypotension: No Has patient had a PCN reaction causing severe rash involving mucus membranes or skin necrosis: No Has patient had a PCN reaction that required hospitalization: No Has patient had a PCN reaction occurring within the  last 10 years: No If all of the above answers are "NO", then may proceed with Cephalosporin use.      Medication List    TAKE these medications   albuterol 108 (90 Base) MCG/ACT inhaler Commonly known as:  PROVENTIL HFA;VENTOLIN HFA Inhale 2 puffs into the lungs every 6 (six) hours as needed for wheezing or shortness of breath.   albuterol 0.63 MG/3ML nebulizer solution Commonly known as:  ACCUNEB Take 3 mLs (0.63 mg total) by nebulization every 4 (four) hours as needed for wheezing. J44.9   AMBULATORY NON FORMULARY MEDICATION Medication Name: Flutter valve DX:J44.9   amLODipine 5 MG tablet Commonly known as:  NORVASC TAKE 1 TABLET DAILY   aspirin 325 MG EC tablet Take 325 mg by mouth daily.   brimonidine 0.2 % ophthalmic solution Commonly known as:  ALPHAGAN Place 1 drop into both eyes 2 (two) times daily.   cefdinir 300 MG capsule Commonly known as:  OMNICEF Take 1 capsule (300 mg total) by mouth every 12 (twelve) hours.   esomeprazole 20 MG capsule Commonly known as:  NEXIUM TAKE 1 CAPSULE TWICE A DAY   Fluticasone-Salmeterol 250-50 MCG/DOSE Aepb Commonly known as:  ADVAIR DISKUS Inhale 1 puff into the lungs 2 (two) times daily.   latanoprost 0.005 % ophthalmic solution Commonly known as:  XALATAN Place 1 drop into both eyes at bedtime.   MIRALAX PO Take by mouth as needed.   Oxycodone HCl 10 MG Tabs   prednisoLONE acetate 1 % ophthalmic suspension Commonly known as:  PRED FORTE Place 1 drop into the left eye daily.   predniSONE 10 MG tablet Commonly known as:  DELTASONE Take 50 mg daily taper by 10 mg daily then stop   simvastatin 20 MG tablet Commonly known as:  ZOCOR TAKE 1 TABLET DAILY   Tiotropium Bromide Monohydrate 2.5 MCG/ACT Aers Commonly known as:  SPIRIVA RESPIMAT Inhale 2 puffs into the lungs daily.   traZODone 50 MG tablet Commonly known as:  DESYREL Take 0.5-1 tablets (25-50 mg total) by mouth at bedtime as needed for sleep.        If you experience worsening of your admission symptoms, develop shortness of breath, life threatening emergency, suicidal or homicidal thoughts you must seek medical attention immediately by calling 911 or calling your MD immediately  if symptoms less severe.  You Must read complete instructions/literature along with all the possible adverse reactions/side effects for all the Medicines you take and that have been prescribed to you. Take any new Medicines after you have completely understood and accept all the possible adverse reactions/side effects.   Please note  You were cared for by a hospitalist during your hospital stay. If you have any questions about your discharge medications or the care you received while you were in the hospital after you are discharged, you can call the unit and asked to speak with the hospitalist on call if the hospitalist that took care of you is not available. Once you are discharged, your primary care physician will handle any further medical issues. Please note that NO REFILLS for any discharge medications will be authorized once you are discharged, as it  is imperative that you return to your primary care physician (or establish a relationship with a primary care physician if you do not have one) for your aftercare needs so that they can reassess your need for medications and monitor your lab values. Today   SUBJECTIVE   Feels better  VITAL SIGNS:  Blood pressure (!) 143/71, pulse 60, temperature 98 F (36.7 C), temperature source Oral, resp. rate 18, height 5\' 8"  (1.727 m), weight 99.7 kg (219 lb 11.2 oz), SpO2 95 %.  I/O:    Intake/Output Summary (Last 24 hours) at 07/25/2017 0835 Last data filed at 07/24/2017 1923 Gross per 24 hour  Intake 480 ml  Output 400 ml  Net 80 ml    PHYSICAL EXAMINATION:  GENERAL:  84 y.o.-year-old patient lying in the bed with no acute distress.  EYES: Pupils equal, round, reactive to light and accommodation. No scleral  icterus. Extraocular muscles intact.  HEENT: Head atraumatic, normocephalic. Oropharynx and nasopharynx clear.  NECK:  Supple, no jugular venous distention. No thyroid enlargement, no tenderness.  LUNGS: Normal breath sounds bilaterally, no wheezing, rales,rhonchi or crepitation. No use of accessory muscles of respiration.  CARDIOVASCULAR: S1, S2 normal. No murmurs, rubs, or gallops.  ABDOMEN: Soft, non-tender, non-distended. Bowel sounds present. No organomegaly or mass.  EXTREMITIES: No pedal edema, cyanosis, or clubbing.  NEUROLOGIC: Cranial nerves II through XII are intact. Muscle strength 5/5 in all extremities. Sensation intact. Gait not checked.  PSYCHIATRIC: The patient is alert and oriented x 3.  SKIN: No obvious rash, lesion, or ulcer.   DATA REVIEW:   CBC  Recent Labs  Lab 07/24/17 0512  WBC 6.2  HGB 14.6  HCT 42.1  PLT 189    Chemistries  Recent Labs  Lab 07/23/17 1823 07/24/17 0512  NA 138 137  K 4.6 4.3  CL 103 107  CO2 24 22  GLUCOSE 105* 161*  BUN 20 19  CREATININE 1.31* 1.17  CALCIUM 9.0 8.6*  AST 24  --   ALT 15*  --   ALKPHOS 80  --   BILITOT 0.7  --     Microbiology Results   Recent Results (from the past 240 hour(s))  MRSA PCR Screening     Status: None   Collection Time: 07/24/17  9:26 AM  Result Value Ref Range Status   MRSA by PCR NEGATIVE NEGATIVE Final    Comment:        The GeneXpert MRSA Assay (FDA approved for NASAL specimens only), is one component of a comprehensive MRSA colonization surveillance program. It is not intended to diagnose MRSA infection nor to guide or monitor treatment for MRSA infections. Performed at [facility], [address] [place], [place] [address]     RADIOLOGY:  Dg Chest 2 View  Result Date: 07/23/2017 CLINICAL DATA:  Shortness of breath and cough for 2 days EXAM: CHEST - 2 VIEW COMPARISON:  05/14/2017 FINDINGS: Enlargement of cardiac silhouette post CABG. Mediastinal contours and  pulmonary vascularity normal. Bronchitic changes with new LEFT upper lobe infiltrate consistent with pneumonia. Chronic accentuation of bibasilar markings. Remaining lungs clear. No pleural effusion or pneumothorax. Bones demineralized. IMPRESSION: Enlargement of cardiac silhouette post CABG. Bronchitic changes with new LEFT upper lobe infiltrate consistent with pneumonia. Electronically Signed   By: Lavonia Dana M.D.   On: 07/23/2017 19:59     Management plans discussed with the patient, family and they are in agreement.  CODE STATUS:     Code Status Orders  (From  admission, onward)        Start     Ordered   07/23/17 2345  Full code  Continuous     07/23/17 2344    Code Status History    Date Active Date Inactive Code Status Order ID Comments User Context   03/11/2017 1418 03/13/2017 1707 Full Code [id number]  Bettey Costa, MD ED   05/23/2016 2319 05/24/2016 2056 Full Code [id number]  Lance Coon, MD Inpatient      TOTAL TIME TAKING CARE OF THIS PATIENT: *40* minutes.    Fritzi Mandes M.D on 07/25/2017 at 8:35 AM  Between 7am to 6pm - Pager - [phone] After 6pm go to [email or web address] - password [email or web address] [facility]  Office  [phone]  CC: Primary care physician; Jerrol Banana., MD

## 2017-07-25 NOTE — Progress Notes (Signed)
SATURATION QUALIFICATIONS: (This note is used to comply with regulatory documentation for home oxygen)  Patient Saturations on Room Air at Rest = 95%  Patient Saturations on Room Air while Ambulating = 89  Patient Saturations on  Liters of oxygen while Ambulating = %  Please briefly explain why patient needs home oxygen:  Patient amubalated around unit did not drop below 89%

## 2017-07-25 NOTE — Progress Notes (Signed)
Received Md order to discharge patient to home, reviewed home meds, prescriptions,  discharge instructions and follow up appointments with patient and daughter and both verbalized  understanding, also reviewed incentive spirometry usage and pursed lip breathing with patient and daughter

## 2017-07-25 NOTE — Discharge Instructions (Signed)
Usually nebulizer and inhalers as before

## 2017-07-25 NOTE — Plan of Care (Signed)
  Problem: Spiritual Needs Goal: Ability to function at adequate level Outcome: Progressing   Problem: Health Behavior/Discharge Planning: Goal: Ability to manage health-related needs will improve Outcome: Progressing   Problem: Clinical Measurements: Goal: Ability to maintain clinical measurements within normal limits will improve Outcome: Progressing Goal: Will remain free from infection Outcome: Progressing Goal: Diagnostic test results will improve Outcome: Progressing   Problem: Activity: Goal: Risk for activity intolerance will decrease Outcome: Progressing   Problem: Pain Managment: Goal: General experience of comfort will improve Outcome: Progressing   Problem: Education: Goal: Knowledge of disease or condition will improve Outcome: Progressing Goal: Knowledge of the prescribed therapeutic regimen will improve Outcome: Progressing   Problem: Respiratory: Goal: Ability to maintain a clear airway will improve Outcome: Progressing Goal: Levels of oxygenation will improve Outcome: Progressing Goal: Ability to maintain adequate ventilation will improve Outcome: Progressing

## 2017-07-26 ENCOUNTER — Telehealth: Payer: Self-pay

## 2017-07-26 ENCOUNTER — Other Ambulatory Visit: Payer: Self-pay | Admitting: Family Medicine

## 2017-07-26 NOTE — Telephone Encounter (Signed)
Transition Care Management Follow-up Telephone Call  How have you been since you were released from the hospital? Didn't have a good night last night, did a lot of coughing. Coughing is better today. Sputum is yellow. Declines fever, SOB, n/v/d.   Do you understand why you were in the hospital? yes  Do you have a copy of your discharge instructions Yes Do you understand the discharge instrcutions? yes  Where were you discharged to? Home  Do you have support at home? Yes    Items Reviewed:  Medications obtained Yes  Medications reviewed: Yes  Dietary changes reviewed: no  Home Health? N/A  DME ordered at discharge obtained? No  Medical supplies: NA    Functional Questionnaire:   Activities of Daily Living (ADLs):   He states they are independent in the following: ambulation, bathing and hygiene, feeding, continence, grooming, toileting and dressing States they require assistance with the following: medication management  Any transportation issues/concerns?: no  Any patient concerns? no  Confirmed importance and date/time of follow-up visits scheduled with PCP: yes, 08/01/17 @ 3:40 PM  Confirm appointment scheduled with specialist? Yes- pt to schedule apt with pulmonary.  Confirmed with patient if condition begins to worsen call PCP or If it's emergency go to the ER.

## 2017-07-29 ENCOUNTER — Telehealth: Payer: Self-pay | Admitting: Family Medicine

## 2017-07-29 ENCOUNTER — Telehealth: Payer: Self-pay

## 2017-07-29 NOTE — Telephone Encounter (Signed)
Johnathan [name] with [employer] called to let you know pt was discharged without any home health or therapy.  She wants to know if you would order home health for him to finish out his respiratory therapy.  They also wanted to note the hospital talked to him about a sleep test.  Pt has an appt here Monday.  You don't have to call back she just wanted to let Johnathan [name] know what was going on\  teri

## 2017-07-29 NOTE — Telephone Encounter (Signed)
Flagged on EMMI report for not having a follow up scheduled, not knowing who to call about changes in condition, and being unsure if he received discharge papers.  Called and spoke with patient.  He mentioned he thought he got his discharge papers, but was not sure where they were.  I asked if he would like to receive them in the mail and he said yes.  Confirmed address to send discharge papers to.  He mentioned he does have two follow up appointments next week with Dr. Rosanna Randy and Dr. Ashby Dawes.  Encouraged him to call them regarding any changes in his condition that he may notice.  He does not have any other questions or concerns regarding his discharge at this time.  I thanked him for his time and informed him that he would receive one more automated call checking on him in the next few days.

## 2017-08-01 ENCOUNTER — Ambulatory Visit (INDEPENDENT_AMBULATORY_CARE_PROVIDER_SITE_OTHER): Payer: Medicare Other | Admitting: Family Medicine

## 2017-08-01 ENCOUNTER — Encounter: Payer: Self-pay | Admitting: Family Medicine

## 2017-08-01 VITALS — BP 134/72 | HR 64 | Temp 98.5°F | Resp 20 | Wt 224.0 lb

## 2017-08-01 DIAGNOSIS — Z8546 Personal history of malignant neoplasm of prostate: Secondary | ICD-10-CM

## 2017-08-01 DIAGNOSIS — I255 Ischemic cardiomyopathy: Secondary | ICD-10-CM

## 2017-08-01 DIAGNOSIS — J449 Chronic obstructive pulmonary disease, unspecified: Secondary | ICD-10-CM | POA: Diagnosis not present

## 2017-08-01 DIAGNOSIS — M545 Low back pain: Secondary | ICD-10-CM | POA: Diagnosis not present

## 2017-08-01 DIAGNOSIS — J189 Pneumonia, unspecified organism: Secondary | ICD-10-CM

## 2017-08-01 MED ORDER — TRAMADOL HCL 50 MG PO TABS: 50.0000 mg | ORAL_TABLET | ORAL | 5 refills | Status: DC | PRN

## 2017-08-01 NOTE — Progress Notes (Signed)
Patient: Johnathan [name] Male    DOB: Aug 20, 1925   84 y.o.   MRN: [id number] Visit Date: 08/01/2017  Today's Provider: Wilhemena Durie, MD   Chief Complaint  Patient presents with  . Hospitalization Follow-up   Subjective:    HPI  Follow up Hospitalization  Patient was admitted to [facility] on 07/23/2017 and discharged on 07/25/2017. He was treated for pneumonia. Treatment for this included cefdinir and prednisone. Patient reports that he has completed all medications.  Telephone follow up was done on 07/26/2017.   He reports good compliance with treatment. He reports this condition is Improved. However, patient reports that he still has severe coughing spells. He denies fever. He does have some wheezing that seems to be worse in the mornings.  Breathing is better but he complains of worsening low back pain.Acute on chronic .       Allergies  Allergen Reactions  . Sulfa Antibiotics Other (See Comments)    rash  . Penicillins Rash    Has patient had a PCN reaction causing immediate rash, facial/tongue/throat swelling, SOB or lightheadedness with hypotension: No Has patient had a PCN reaction causing severe rash involving mucus membranes or skin necrosis: No Has patient had a PCN reaction that required hospitalization: No Has patient had a PCN reaction occurring within the last 10 years: No If all of the above answers are "NO", then may proceed with Cephalosporin use.      Current Outpatient Medications:  .  albuterol (ACCUNEB) 0.63 MG/3ML nebulizer solution, Take 3 mLs (0.63 mg total) by nebulization every 4 (four) hours as needed for wheezing. J44.9, Disp: 75 mL, Rfl: 11 .  albuterol (PROVENTIL HFA;VENTOLIN HFA) 108 (90 Base) MCG/ACT inhaler, Inhale 2 puffs into the lungs every 6 (six) hours as needed for wheezing or shortness of breath., Disp: 1 Inhaler, Rfl: 1 .  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   amLODipine (NORVASC) 5 MG tablet, TAKE 1 TABLET DAILY, Disp: 90 tablet, Rfl: 3 .  aspirin 325 MG EC tablet, Take 325 mg by mouth daily., Disp: , Rfl:  .  brimonidine (ALPHAGAN) 0.2 % ophthalmic solution, Place 1 drop into both eyes 2 (two) times daily. , Disp: , Rfl:  .  esomeprazole (NEXIUM) 20 MG capsule, TAKE 1 CAPSULE TWICE A DAY, Disp: 180 capsule, Rfl: 2 .  Fluticasone-Salmeterol (ADVAIR DISKUS) 250-50 MCG/DOSE AEPB, Inhale 1 puff into the lungs 2 (two) times daily., Disp: 1 each, Rfl: 5 .  latanoprost (XALATAN) 0.005 % ophthalmic solution, Place 1 drop into both eyes at bedtime. , Disp: , Rfl:  .  Oxycodone HCl 10 MG TABS, , Disp: , Rfl:  .  Polyethylene Glycol 3350 (MIRALAX PO), Take by mouth as needed., Disp: , Rfl:  .  prednisoLONE acetate (PRED FORTE) 1 % ophthalmic suspension, Place 1 drop into the left eye daily. , Disp: , Rfl:  .  simvastatin (ZOCOR) 20 MG tablet, TAKE 1 TABLET DAILY, Disp: 90 tablet, Rfl: 3 .  Tiotropium Bromide Monohydrate (SPIRIVA RESPIMAT) 2.5 MCG/ACT AERS, Inhale 2 puffs into the lungs daily., Disp: 4 g, Rfl: 5 .  traZODone (DESYREL) 50 MG tablet, Take 0.5-1 tablets (25-50 mg total) by mouth at bedtime as needed for sleep., Disp: 90 tablet, Rfl: 1 .  cefdinir (OMNICEF) 300 MG capsule, Take 1 capsule (300 mg total) by mouth every 12 (twelve) hours. (Patient not taking: Reported on 08/01/2017), Disp: 12 capsule,  Rfl: 0 .  predniSONE (DELTASONE) 10 MG tablet, Take 50 mg daily taper by 10 mg daily then stop (Patient not taking: Reported on 08/01/2017), Disp: 15 tablet, Rfl: 0  Current Facility-Administered Medications:  .  ipratropium-albuterol (DUONEB) 0.5-2.5 (3) MG/3ML nebulizer solution 3 mL, 3 mL, Nebulization, Once, Jerrol Banana., MD  Review of Systems  Constitutional: Positive for activity change and fatigue.  HENT: Positive for congestion and sore throat.   Eyes: Negative.   Respiratory: Positive for cough and shortness of breath.     Cardiovascular: Negative for chest pain, palpitations and leg swelling.  Gastrointestinal: Negative.   Endocrine: Negative.   Genitourinary: Negative.   Musculoskeletal: Positive for back pain.  Skin: Negative for color change, pallor and rash.  Allergic/Immunologic: Positive for environmental allergies.  Psychiatric/Behavioral: Negative.     Social History   Tobacco Use  . Smoking status: Former Smoker    Packs/day: 1.00    Years: 30.00    Pack years: 30.00    Types: Cigarettes    Last attempt to quit: 02/16/2003    Years since quitting: 14.4  . Smokeless tobacco: Never Used  . Tobacco comment: is exposed to second hand smoke  Substance Use Topics  . Alcohol use: No   Objective:   BP 134/72 (BP Location: Left Arm, Patient Position: Sitting, Cuff Size: Large)   Pulse 64   Temp 98.5 F (36.9 C)   Resp 20   Wt 224 lb (101.6 kg)   SpO2 95%   BMI 34.06 kg/m  Vitals:   08/01/17 1545  BP: 134/72  Pulse: 64  Resp: 20  Temp: 98.5 F (36.9 C)  SpO2: 95%  Weight: 224 lb (101.6 kg)     Physical Exam  Constitutional: He is oriented to person, place, and time. He appears well-developed and well-nourished.  HENT:  Head: Normocephalic and atraumatic.  Right Ear: External ear normal.  Left Ear: External ear normal.  Nose: Nose normal.  Mouth/Throat: Oropharynx is clear and moist.  Eyes: Conjunctivae are normal. No scleral icterus.  Neck: No thyromegaly present.  Cardiovascular: Normal rate, regular rhythm and normal heart sounds.  Pulmonary/Chest: Effort normal and breath sounds normal.  Abdominal: Soft.  Musculoskeletal: He exhibits no tenderness.  Pain in lower back around belt line with no tenderness on exam.  Lymphadenopathy:    He has no cervical adenopathy.  Neurological: He is alert and oriented to person, place, and time.  Skin: Skin is warm and dry.  Psychiatric: He has a normal mood and affect. His behavior is normal. Judgment and thought content normal.         Assessment & Plan:     1. Low back pain, unspecified back pain laterality, unspecified chronicity, with sciatica presence unspecified  - traMADol (ULTRAM) 50 MG tablet; Take 1 tablet (50 mg total) by mouth every 4 (four) hours as needed.  Dispense: 100 tablet; Refill: 5 2.Pneumonia F/u 4-6 weeks--recheck CXR then. Clinically improving. 3.COPD Pt sees pulmonary tomorrow. 4.CAD      Wilhemena Durie, MD  [facility]

## 2017-08-01 NOTE — Progress Notes (Signed)
Johnathan [name]      Assessment and Plan:  84 year old male with progressive dyspnea on exertion, now severe, with severe emphysema and recent hospitalization for COPD exacerbation.  COPD, group D.  Now with acute bronchitis with acute exacerbation of COPD. - multiple hospital admissions over the past few months.  continued reduced functional status, this, complicated by severe deconditioning with reduced muscle strength which is likely contributing to his dyspnea. - Continue Advair, nebulizers, albuterol, spiriva respimat.   --Keep pets out of bedroom. Rinse mouth after advair, call for refills when medications run out.  --Patient remains at high risk of exacerbation, hospitalization and death due to multiple comorbidities.  --Discussed chronic prednisone with patient and daughter, would like to hold off, but if exacerbations continue would reconsider.   Deconditioning with severe exertional dyspnea. -Decline in functional status over the last 6 months, will refer to pulmonary rehab.    Orders Placed This Encounter  Procedures  . Pulse oximetry, overnight    No follow-ups on file.    Date: 08/01/2017  MRN# [id number] Johnathan [name] [birth year]   Johnathan [name] is a 84 y.o. old male seen in [name] for chief complaint of:    Chief Complaint  Patient presents with  . Hospitalization Follow-up    pt has cough with thick white mucus along with sob during exertion. Pt denies chest tightness/wheezing.    HPI:   The patient is an 84 year old male with a history of COPD with exacerbation.  At last visit he appeared to be having an exacerbation and he was asked to continue Advair, nebulizers, flutter valve, Spiriva, and asked to use his flutter valve after breathing treatments, rinse mouth after using advair, keep pets out of bedroom.   He was discharged from the hospital on 6/10 after a 2 day admission for AECOPD and pneumonia. Daughter is present and  notes that he wakes frequently during the night, when in the hospital he was on oxygen and she feels that he slept better with the oxygen. He completed the medications from the hospital. He is using spiriva once daily and advair twice daily.  He is doing nebs twice daily but he has not been doing the flutter valve.  He has a cat at home, sleeps in bed with him.  He has some reflux, he takes nexium which helps.  He does have some sinus drainage, he does not take anything for it.    Imaging personally reviewed, chest x-ray 03/16/17, hyperinflation consistent with emphysema, increased interstitial markings in both bases likely some degree of interstitial scarring. CT chest 07/01/15; mild emphysema.  **Full PFT 03/22/17; FVC 68%, FEV1 48% predicted, there is no improvement with bronchodilator, flow volume loop appears obstructed. TLC 79% predicted, RV to TLC ratio is normal.  Diffusion capacity is 51%. -Overall this test is consistent with severe obstructive lung disease.  **Desat walk on rest on RA sat is 92% and HR 51. Walked 150 feet, sat dropped to 89%, HR 75, severe dyspnea, poor gait.  Social Hx:   Social History   Tobacco Use  . Smoking status: Former Smoker    Packs/day: 1.00    Years: 30.00    Pack years: 30.00    Types: Cigarettes    Last attempt to quit: 02/16/2003    Years since quitting: 14.4  . Smokeless tobacco: Never Used  . Tobacco comment: is exposed to second hand smoke  Substance Use Topics  . Alcohol use: No  . Drug  use: No   Medication:    Current Outpatient Medications:  .  albuterol (ACCUNEB) 0.63 MG/3ML nebulizer solution, Take 3 mLs (0.63 mg total) by nebulization every 4 (four) hours as needed for wheezing. J44.9, Disp: 75 mL, Rfl: 11 .  albuterol (PROVENTIL HFA;VENTOLIN HFA) 108 (90 Base) MCG/ACT inhaler, Inhale 2 puffs into the lungs every 6 (six) hours as needed for wheezing or shortness of breath., Disp: 1 Inhaler, Rfl: 1 .  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  amLODipine (NORVASC) 5 MG tablet, TAKE 1 TABLET DAILY, Disp: 90 tablet, Rfl: 3 .  aspirin 325 MG EC tablet, Take 325 mg by mouth daily., Disp: , Rfl:  .  brimonidine (ALPHAGAN) 0.2 % ophthalmic solution, Place 1 drop into both eyes 2 (two) times daily. , Disp: , Rfl:  .  cefdinir (OMNICEF) 300 MG capsule, Take 1 capsule (300 mg total) by mouth every 12 (twelve) hours., Disp: 12 capsule, Rfl: 0 .  esomeprazole (NEXIUM) 20 MG capsule, TAKE 1 CAPSULE TWICE A DAY, Disp: 180 capsule, Rfl: 2 .  Fluticasone-Salmeterol (ADVAIR DISKUS) 250-50 MCG/DOSE AEPB, Inhale 1 puff into the lungs 2 (two) times daily., Disp: 1 each, Rfl: 5 .  latanoprost (XALATAN) 0.005 % ophthalmic solution, Place 1 drop into both eyes at bedtime. , Disp: , Rfl:  .  Oxycodone HCl 10 MG TABS, , Disp: , Rfl:  .  Polyethylene Glycol 3350 (MIRALAX PO), Take by mouth as needed., Disp: , Rfl:  .  prednisoLONE acetate (PRED FORTE) 1 % ophthalmic suspension, Place 1 drop into the left eye daily. , Disp: , Rfl:  .  predniSONE (DELTASONE) 10 MG tablet, Take 50 mg daily taper by 10 mg daily then stop, Disp: 15 tablet, Rfl: 0 .  simvastatin (ZOCOR) 20 MG tablet, TAKE 1 TABLET DAILY, Disp: 90 tablet, Rfl: 3 .  Tiotropium Bromide Monohydrate (SPIRIVA RESPIMAT) 2.5 MCG/ACT AERS, Inhale 2 puffs into the lungs daily., Disp: 4 g, Rfl: 5 .  traZODone (DESYREL) 50 MG tablet, Take 0.5-1 tablets (25-50 mg total) by mouth at bedtime as needed for sleep. (Patient not taking: Reported on 07/26/2017), Disp: 90 tablet, Rfl: 1  Current Facility-Administered Medications:  .  ipratropium-albuterol (DUONEB) 0.5-2.5 (3) MG/3ML nebulizer solution 3 mL, 3 mL, Nebulization, Once, Jerrol Banana., MD   Allergies:  Sulfa antibiotics and Penicillins  Review of Systems: Gen:  Denies  fever, sweats, chills HEENT: Denies blurred vision, double vision. bleeds, sore throat Cvc:  No  dizziness, chest pain. Resp:   Denies cough or sputum production, shortness of breath Gi: Denies swallowing difficulty, stomach pain. Gu:  Denies bladder incontinence, burning urine Ext:   No Joint pain, stiffness. Skin: No skin rash,  hives  Endoc:  No polyuria, polydipsia. Psych: No depression, insomnia. Other:  All other systems were reviewed with the patient and were negative other that what is mentioned in the HPI.   Physical Examination:   VS: BP 130/80 (BP Location: Left Arm, Cuff Size: Large)   Pulse 61   Resp 16   Ht 5\' 8"  (1.727 m)   Wt 224 lb (101.6 kg)   SpO2 94%   BMI 34.06 kg/m   General Appearance: No distress  Neuro:without focal findings,  speech normal,  HEENT: PERRLA, EOM intact.   Pulmonary: normal breath sounds, No wheezing.  CardiovascularNormal S1,S2.  No m/r/g.   Abdomen: Benign, Soft, non-tender. Renal:  No costovertebral tenderness  GU:  No performed at this time. Endoc: No evident thyromegaly, no signs of acromegaly. Skin:   warm, no rashes, no ecchymosis  Extremities: normal, no cyanosis, clubbing.  Other findings:    LABORATORY PANEL:   CBC No results for input(s): WBC, HGB, HCT, PLT in the last 168 hours. ------------------------------------------------------------------------------------------------------------------  Chemistries  No results for input(s): NA, K, CL, CO2, GLUCOSE, BUN, CREATININE, CALCIUM, MG, AST, ALT, ALKPHOS, BILITOT in the last 168 hours.  Invalid input(s): GFRCGP ------------------------------------------------------------------------------------------------------------------  Cardiac Enzymes No results for input(s): TROPONINI in the last 168 hours. ------------------------------------------------------------  RADIOLOGY:  No results found.     Thank  you for the [name] and for allowing [facility] to assist in the care of your patient. Our recommendations are noted above.   Please contact us if we can be of further service.   Marda Stalker, MD.  Board Certified in Internal Medicine, Pulmonary Medicine, [place], and Sleep Medicine.  [facility] Office Number: [phone]  Patricia Pesa, M.D.  Merton Border, M.D  08/01/2017

## 2017-08-02 ENCOUNTER — Encounter: Payer: Self-pay | Admitting: Internal Medicine

## 2017-08-02 ENCOUNTER — Ambulatory Visit (INDEPENDENT_AMBULATORY_CARE_PROVIDER_SITE_OTHER): Payer: Medicare Other | Admitting: Internal Medicine

## 2017-08-02 VITALS — BP 130/80 | HR 61 | Resp 16 | Ht 68.0 in | Wt 224.0 lb

## 2017-08-02 DIAGNOSIS — J449 Chronic obstructive pulmonary disease, unspecified: Secondary | ICD-10-CM | POA: Diagnosis not present

## 2017-08-02 NOTE — Telephone Encounter (Signed)
Patient saw pulmonology today and was referred to pulmonary rehab and sleep testing.

## 2017-08-02 NOTE — Patient Instructions (Signed)
Will order overnight oximetry on room air.  Continue current medications.  If breathing continues to decline will consider starting on prednisone 20 mg daily.

## 2017-08-05 ENCOUNTER — Encounter: Payer: Self-pay | Admitting: Internal Medicine

## 2017-08-10 ENCOUNTER — Telehealth: Payer: Self-pay | Admitting: *Deleted

## 2017-08-10 DIAGNOSIS — J449 Chronic obstructive pulmonary disease, unspecified: Secondary | ICD-10-CM

## 2017-08-10 NOTE — Telephone Encounter (Signed)
Pt aware results of ONO. Orders placed for 2Liters 02 qhs. KDT:[id number] Nothing further needed.

## 2017-08-25 DIAGNOSIS — J189 Pneumonia, unspecified organism: Secondary | ICD-10-CM | POA: Insufficient documentation

## 2017-08-27 DIAGNOSIS — N183 Chronic kidney disease, stage 3 unspecified: Secondary | ICD-10-CM | POA: Insufficient documentation

## 2017-08-31 DIAGNOSIS — K8000 Calculus of gallbladder with acute cholecystitis without obstruction: Secondary | ICD-10-CM | POA: Insufficient documentation

## 2017-09-06 ENCOUNTER — Inpatient Hospital Stay: Payer: Medicare Other | Admitting: Family Medicine

## 2017-09-08 ENCOUNTER — Ambulatory Visit: Payer: Medicare Other | Admitting: Urology

## 2017-09-12 ENCOUNTER — Telehealth: Payer: Self-pay | Admitting: Internal Medicine

## 2017-09-12 ENCOUNTER — Ambulatory Visit: Payer: Self-pay | Admitting: Family Medicine

## 2017-09-12 DIAGNOSIS — J449 Chronic obstructive pulmonary disease, unspecified: Secondary | ICD-10-CM

## 2017-09-12 NOTE — Telephone Encounter (Signed)
Pt states he wants his oxygen picked up and states he is still not sleeping. States he is not paying for something he isn't wearing. Can we place order to D/C oxygen?

## 2017-09-12 NOTE — Telephone Encounter (Signed)
Patient wants [facility] to come pick up o2 he doesn't want it and they will not do this without an order

## 2017-09-26 NOTE — Telephone Encounter (Signed)
Tried to call pt to inform him d/c order has been placed but no answer and no VM. Order to d/c placed. Nothing further needed.

## 2017-09-26 NOTE — Telephone Encounter (Signed)
Can DC oxygen per patient request, but against medical advice.

## 2017-09-29 ENCOUNTER — Telehealth: Payer: Self-pay | Admitting: *Deleted

## 2017-09-29 MED ORDER — TIOTROPIUM BROMIDE MONOHYDRATE 18 MCG IN CAPS: 18.0000 ug | ORAL_CAPSULE | Freq: Every day | RESPIRATORY_TRACT | 5 refills | Status: DC

## 2017-09-29 NOTE — Telephone Encounter (Signed)
Can try spiriva handihaler.

## 2017-09-29 NOTE — Telephone Encounter (Signed)
PA submitted for Spiriva Resp. Per patient insurance no longer covered. Inhaler will need to be changed.

## 2017-09-29 NOTE — Telephone Encounter (Signed)
Rx spiriva handihaler sent to pharmacy.

## 2017-10-03 ENCOUNTER — Ambulatory Visit: Payer: Medicare Other

## 2017-10-04 ENCOUNTER — Telehealth: Payer: Self-pay

## 2017-10-04 NOTE — Telephone Encounter (Signed)
Called pt to r/s previously cancelled AWV and pt declines and states he does not want to scheule the wellness. FYI to PCP. -MM

## 2017-10-05 ENCOUNTER — Encounter: Payer: Self-pay | Admitting: Family Medicine

## 2017-10-05 ENCOUNTER — Ambulatory Visit
Admission: RE | Admit: 2017-10-05 | Discharge: 2017-10-05 | Disposition: A | Discharge status: Home / Self Care | Payer: Medicare Other | Source: Ambulatory Visit | Attending: Family Medicine | Admitting: Family Medicine

## 2017-10-05 ENCOUNTER — Ambulatory Visit (INDEPENDENT_AMBULATORY_CARE_PROVIDER_SITE_OTHER): Payer: Medicare Other | Admitting: Family Medicine

## 2017-10-05 VITALS — BP 132/68 | HR 62 | Temp 97.8°F | Resp 20 | Wt 207.0 lb

## 2017-10-05 DIAGNOSIS — J449 Chronic obstructive pulmonary disease, unspecified: Secondary | ICD-10-CM | POA: Insufficient documentation

## 2017-10-05 DIAGNOSIS — J440 Chronic obstructive pulmonary disease with acute lower respiratory infection: Secondary | ICD-10-CM

## 2017-10-05 DIAGNOSIS — I255 Ischemic cardiomyopathy: Secondary | ICD-10-CM | POA: Diagnosis not present

## 2017-10-05 DIAGNOSIS — Z8701 Personal history of pneumonia (recurrent): Secondary | ICD-10-CM | POA: Insufficient documentation

## 2017-10-05 DIAGNOSIS — J189 Pneumonia, unspecified organism: Secondary | ICD-10-CM

## 2017-10-05 DIAGNOSIS — J209 Acute bronchitis, unspecified: Secondary | ICD-10-CM

## 2017-10-05 NOTE — Progress Notes (Signed)
Patient: Johnathan [name] Male    DOB: 1925/12/30   84 y.o.   MRN: [id number] Visit Date: 10/05/2017  Today's Provider: Wilhemena Durie, MD   Chief Complaint  Patient presents with  . Pneumonia    follow up    Subjective:    HPI Patient comes in today for a follow up. He was last seen in the office about 6 weeks ago.   He reports that his breathing is slightly better. It is back to his baseline. He is due for a repeat CXR for pneumonia clearance.   He also mentions that the tramadol that was prescribed on last visit did not help with his back pain.     Allergies  Allergen Reactions  . Sulfa Antibiotics Other (See Comments)    rash  . Penicillins Rash    Has patient had a PCN reaction causing immediate rash, facial/tongue/throat swelling, SOB or lightheadedness with hypotension: No Has patient had a PCN reaction causing severe rash involving mucus membranes or skin necrosis: No Has patient had a PCN reaction that required hospitalization: No Has patient had a PCN reaction occurring within the last 10 years: No If all of the above answers are "NO", then may proceed with Cephalosporin use.      Current Outpatient Medications:  .  albuterol (ACCUNEB) 0.63 MG/3ML nebulizer solution, Take 3 mLs (0.63 mg total) by nebulization every 4 (four) hours as needed for wheezing. J44.9, Disp: 75 mL, Rfl: 11 .  albuterol (PROVENTIL HFA;VENTOLIN HFA) 108 (90 Base) MCG/ACT inhaler, Inhale 2 puffs into the lungs every 6 (six) hours as needed for wheezing or shortness of breath., Disp: 1 Inhaler, Rfl: 1 .  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  amLODipine (NORVASC) 5 MG tablet, TAKE 1 TABLET DAILY, Disp: 90 tablet, Rfl: 3 .  aspirin 325 MG EC tablet, Take 325 mg by mouth daily., Disp: , Rfl:  .  brimonidine (ALPHAGAN) 0.2 % ophthalmic solution, Place 1 drop into both eyes 2 (two) times daily. , Disp: , Rfl:  .  cefdinir  (OMNICEF) 300 MG capsule, Take 1 capsule (300 mg total) by mouth every 12 (twelve) hours., Disp: 12 capsule, Rfl: 0 .  esomeprazole (NEXIUM) 20 MG capsule, TAKE 1 CAPSULE TWICE A DAY, Disp: 180 capsule, Rfl: 2 .  Fluticasone-Salmeterol (ADVAIR DISKUS) 250-50 MCG/DOSE AEPB, Inhale 1 puff into the lungs 2 (two) times daily., Disp: 1 each, Rfl: 5 .  latanoprost (XALATAN) 0.005 % ophthalmic solution, Place 1 drop into both eyes at bedtime. , Disp: , Rfl:  .  Oxycodone HCl 10 MG TABS, , Disp: , Rfl:  .  Polyethylene Glycol 3350 (MIRALAX PO), Take by mouth as needed., Disp: , Rfl:  .  prednisoLONE acetate (PRED FORTE) 1 % ophthalmic suspension, Place 1 drop into the left eye daily. , Disp: , Rfl:  .  predniSONE (DELTASONE) 10 MG tablet, Take 50 mg daily taper by 10 mg daily then stop, Disp: 15 tablet, Rfl: 0 .  simvastatin (ZOCOR) 20 MG tablet, TAKE 1 TABLET DAILY, Disp: 90 tablet, Rfl: 3 .  tiotropium (SPIRIVA HANDIHALER) 18 MCG inhalation capsule, Place 1 capsule (18 mcg total) into inhaler and inhale daily., Disp: 30 capsule, Rfl: 5 .  traMADol (ULTRAM) 50 MG tablet, Take 1 tablet (50 mg total) by mouth every 4 (four) hours as needed., Disp: 100 tablet, Rfl: 5 .  traZODone (DESYREL) 50 MG tablet, Take  0.5-1 tablets (25-50 mg total) by mouth at bedtime as needed for sleep., Disp: 90 tablet, Rfl: 1  Current Facility-Administered Medications:  .  ipratropium-albuterol (DUONEB) 0.5-2.5 (3) MG/3ML nebulizer solution 3 mL, 3 mL, Nebulization, Once, Jerrol Banana., MD  Review of Systems  Constitutional: Negative for activity change, appetite change, chills, diaphoresis, fatigue, fever and unexpected weight change.  Respiratory: Positive for shortness of breath. Negative for wheezing.        Has COPD  Cardiovascular: Negative for chest pain, palpitations and leg swelling.  Endocrine: Negative for cold intolerance, heat intolerance and polyuria.  Musculoskeletal: Positive for arthralgias, back pain  and myalgias.  Neurological: Negative for dizziness, light-headedness and headaches.  Psychiatric/Behavioral: Positive for agitation.    Social History   Tobacco Use  . Smoking status: Former Smoker    Packs/day: 1.00    Years: 30.00    Pack years: 30.00    Types: Cigarettes    Last attempt to quit: 02/16/2003    Years since quitting: 14.6  . Smokeless tobacco: Never Used  . Tobacco comment: is exposed to second hand smoke  Substance Use Topics  . Alcohol use: No   Objective:   BP 132/68 (BP Location: Right Arm, Patient Position: Sitting, Cuff Size: Large)   Pulse 62   Temp 97.8 F (36.6 C)   Resp 20   Wt 207 lb (93.9 kg)   SpO2 96%   BMI 31.47 kg/m  Vitals:   10/05/17 0813  BP: 132/68  Pulse: 62  Resp: 20  Temp: 97.8 F (36.6 C)  SpO2: 96%  Weight: 207 lb (93.9 kg)     Physical Exam  Constitutional: He is oriented to person, place, and time. He appears well-developed and well-nourished.  HENT:  Head: Normocephalic and atraumatic.  Right Ear: External ear normal.  Left Ear: External ear normal.  Nose: Nose normal.  Eyes: Conjunctivae are normal. No scleral icterus.  Neck: No thyromegaly present.  Cardiovascular: Normal rate, regular rhythm and normal heart sounds.  Pulmonary/Chest: Effort normal and breath sounds normal.  Abdominal: Soft.  Musculoskeletal: He exhibits no edema.  Neurological: He is alert and oriented to person, place, and time.  Skin: Skin is warm and dry.  Psychiatric: He has a normal mood and affect. His behavior is normal. Judgment and thought content normal.        Assessment & Plan:     1. Pneumonia due to infectious organism, unspecified laterality, unspecified part of lung  - DG Chest 2 View; Future 2.COPD 3.CAD All risk factors treated.      I have done the exam and reviewed the above chart and it is accurate to the best of my knowledge. Development worker, community has been used in this note in any air is in the dictation or  transcription are unintentional.  Wilhemena Durie, MD  [place]

## 2017-10-27 ENCOUNTER — Telehealth: Payer: Self-pay | Admitting: *Deleted

## 2017-10-27 ENCOUNTER — Other Ambulatory Visit: Payer: Self-pay | Admitting: Internal Medicine

## 2017-10-27 MED ORDER — FLUTICASONE-SALMETEROL 250-50 MCG/DOSE IN AEPB: 1.0000 | INHALATION_SPRAY | Freq: Two times a day (BID) | RESPIRATORY_TRACT | 5 refills | Status: DC

## 2017-10-27 MED ORDER — FLUTICASONE-SALMETEROL 250-50 MCG/DOSE IN AEPB: 1.0000 | INHALATION_SPRAY | Freq: Two times a day (BID) | RESPIRATORY_TRACT | 2 refills | Status: DC

## 2017-10-27 NOTE — Telephone Encounter (Signed)
PA initiated for [employer]. Spiriva Respimat previously denied 09/29/17 09/27/17-10/27/18 Ref [id number] Approved.

## 2017-10-27 NOTE — Addendum Note (Signed)
Addended by: Oscar La R on: 10/27/2017 04:43 PM   Modules accepted: Orders

## 2017-10-27 NOTE — Telephone Encounter (Signed)
Patient calling  States he would like to speak with nurse in regards to Advair, believes there may be a medication mix up Please call to discuss

## 2017-10-27 NOTE — Telephone Encounter (Signed)
Advair sent to mail order pharmacy per pt request. Nothing further needed.

## 2017-11-09 ENCOUNTER — Ambulatory Visit (INDEPENDENT_AMBULATORY_CARE_PROVIDER_SITE_OTHER): Payer: Medicare Other | Admitting: Family Medicine

## 2017-11-09 DIAGNOSIS — Z23 Encounter for immunization: Secondary | ICD-10-CM | POA: Diagnosis not present

## 2017-11-16 ENCOUNTER — Telehealth: Payer: Self-pay | Admitting: Internal Medicine

## 2017-11-16 NOTE — Telephone Encounter (Signed)
Patient daughter Johnathan [name] calling Patient does not want to keep the oxygen and would like to know if we can contact [facility] to come and take it out of home Please call to discuss

## 2017-11-16 NOTE — Telephone Encounter (Signed)
Advised patient that MD recommends he stay on 02 therapy if possible. Pt was concerned about cost. Had Rhonda call [facility] and find out patient expense. Patient is only responsible for $16 per month. Patient has decided to keep 02. Nothing further needed.

## 2017-11-21 NOTE — Progress Notes (Signed)
[facility] Consultation      Assessment and Plan:  84 year old male with progressive dyspnea on exertion, now severe, with severe emphysema and recent hospitalization for COPD exacerbation.  COPD, group D. -Continued multiple hospital admissions for COPD/pneumonia, including 2 recent admissions since last visit here. - We will stop Advair, Incruse, will start patient on Anoro inhaler. --Reiterated keep pets out of bedroom.  --Patient remains at high risk of exacerbation, hospitalization, death. --We will start chronic azithromycin 250 mg Monday through Friday indefinitely.  Continued deconditioning with severe exertional dyspnea. -Discussed pulmonary rehab, will continue to try to advance activity.  No orders of the defined types were placed in this encounter.  Meds ordered this encounter  Medications  . umeclidinium-vilanterol (ANORO ELLIPTA) 62.5-25 MCG/INH AEPB    Sig: Inhale 1 puff into the lungs daily.    Dispense:  1 each    Refill:  5  . DISCONTD: azithromycin (ZITHROMAX) 250 MG tablet    Sig: Take 1 tablet (250 mg total) by mouth daily. 1 tablet 5 days per week (Monday-Friday)    Dispense:  22 tablet    Refill:  0  . azithromycin (ZITHROMAX) 250 MG tablet    Sig: Take 1 tablet (250 mg total) by mouth daily. 1 tablet 5 days per week (Monday-Friday)    Dispense:  90 tablet    Refill:  0     Return in about 3 months (around 02/22/2018).  Greater than 50% of the 25 minute visit was spent in counseling/coordination of care regarding COPD and multiple hospital admissions.    Date: 11/21/2017  MRN# [id number] Johnathan [name] [birth year]   Johnathan [name] is a 84 y.o. old male seen in consultation for chief complaint of:    Chief Complaint  Patient presents with  . COPD    pt c/o of cough/sob with exertion and occasional wheezing/chest tightness.  . hypoxia    pt wears 2 Liters 02 qhs.    HPI:   The patient is an 84 year old male with a history of  multiple COPD exacerbations.  At last visit he was asked to continue Advair, nebulizers, albuterol, spiriva respimat, flutter valve, sent for overnight oximetry.  At last visit we discussed going on chronic prednisone to prevent exacerbations. Since his last visit he has had another admission to the hospital for COPD exacerbation/pneumonia, he was discharged on 07/25/2017 after 2-day admission. About a week later he was admitted to [facility] for about 6 days for pneumonia.  He had been doing well since that time, however over the past 1 week has been having more dyspnea, cough, which has been productive.   He is not currently taking any abx or prednisone currently. He is on Incruse once daily, advair twice daily. He is using nebulizers as needed.    He has a cat at home, sleeps in bed with him.  He has some reflux, he takes nexium which helps.  He does have some sinus drainage, he does not take anything for it.   **chest x-ray 03/16/17>> hyperinflation consistent with emphysema, increased interstitial markings in both bases likely some degree of interstitial scarring. **CT chest 07/01/15>> mild emphysema.  **CBC 05/09/2017>> absolute eosinophil count equals 100. **Full PFT 03/22/17>> FVC 68%, FEV1 48% predicted, there is no improvement with bronchodilator, flow volume loop appears obstructed. TLC 79% predicted, RV to TLC ratio is normal.  Diffusion capacity is 51%. -Overall this test is consistent with severe obstructive lung disease. **Echocardiogram 05/24/2016>> EF 45%. **Desat  walk on rest on RA sat is 92% and HR 51. Walked 150 feet, sat dropped to 89%, HR 75, severe dyspnea, poor gait.  Social Hx:   Social History   Tobacco Use  . Smoking status: Former Smoker    Packs/day: 1.00    Years: 30.00    Pack years: 30.00    Types: Cigarettes    Last attempt to quit: 02/16/2003    Years since quitting: 14.7  . Smokeless tobacco: Never Used  . Tobacco comment: is exposed to second hand smoke    Substance Use Topics  . Alcohol use: No  . Drug use: No   Medication:    Current Outpatient Medications:  .  albuterol (ACCUNEB) 0.63 MG/3ML nebulizer solution, Take 3 mLs (0.63 mg total) by nebulization every 4 (four) hours as needed for wheezing. J44.9, Disp: 75 mL, Rfl: 11 .  albuterol (PROVENTIL HFA;VENTOLIN HFA) 108 (90 Base) MCG/ACT inhaler, Inhale 2 puffs into the lungs every 6 (six) hours as needed for wheezing or shortness of breath., Disp: 1 Inhaler, Rfl: 1 .  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  amLODipine (NORVASC) 5 MG tablet, TAKE 1 TABLET DAILY, Disp: 90 tablet, Rfl: 3 .  aspirin 325 MG EC tablet, Take 325 mg by mouth daily., Disp: , Rfl:  .  brimonidine (ALPHAGAN) 0.2 % ophthalmic solution, Place 1 drop into both eyes 2 (two) times daily. , Disp: , Rfl:  .  cefdinir (OMNICEF) 300 MG capsule, Take 1 capsule (300 mg total) by mouth every 12 (twelve) hours., Disp: 12 capsule, Rfl: 0 .  esomeprazole (NEXIUM) 20 MG capsule, TAKE 1 CAPSULE TWICE A DAY, Disp: 180 capsule, Rfl: 2 .  Fluticasone-Salmeterol (ADVAIR DISKUS) 250-50 MCG/DOSE AEPB, Inhale 1 puff into the lungs 2 (two) times daily., Disp: 3 each, Rfl: 2 .  latanoprost (XALATAN) 0.005 % ophthalmic solution, Place 1 drop into both eyes at bedtime. , Disp: , Rfl:  .  Oxycodone HCl 10 MG TABS, , Disp: , Rfl:  .  Polyethylene Glycol 3350 (MIRALAX PO), Take by mouth as needed., Disp: , Rfl:  .  prednisoLONE acetate (PRED FORTE) 1 % ophthalmic suspension, Place 1 drop into the left eye daily. , Disp: , Rfl:  .  predniSONE (DELTASONE) 10 MG tablet, Take 50 mg daily taper by 10 mg daily then stop, Disp: 15 tablet, Rfl: 0 .  simvastatin (ZOCOR) 20 MG tablet, TAKE 1 TABLET DAILY, Disp: 90 tablet, Rfl: 3 .  tiotropium (SPIRIVA HANDIHALER) 18 MCG inhalation capsule, Place 1 capsule (18 mcg total) into inhaler and inhale daily., Disp: 30 capsule, Rfl: 5 .  traMADol (ULTRAM)  50 MG tablet, Take 1 tablet (50 mg total) by mouth every 4 (four) hours as needed., Disp: 100 tablet, Rfl: 5 .  traZODone (DESYREL) 50 MG tablet, Take 0.5-1 tablets (25-50 mg total) by mouth at bedtime as needed for sleep., Disp: 90 tablet, Rfl: 1  Current Facility-Administered Medications:  .  ipratropium-albuterol (DUONEB) 0.5-2.5 (3) MG/3ML nebulizer solution 3 mL, 3 mL, Nebulization, Once, Jerrol Banana., MD   Allergies:  Sulfa antibiotics and Penicillins  Review of Systems:  Constitutional: Feels well. Cardiovascular: Denies chest pain, exertional chest pain.  Pulmonary: Denies hemoptysis, pleuritic chest pain.   The remainder of systems were reviewed and were found to be negative other than what is documented in the HPI.    Physical Examination:   VS: BP 140/76 (BP Location: Left Arm,  Cuff Size: Large)   Pulse 61   Resp 16   Ht 5\' 8"  (1.727 m)   Wt 215 lb (97.5 kg)   SpO2 95%   BMI 32.69 kg/m   General Appearance: No distress  Neuro:without focal findings, mental status, speech normal, alert and oriented HEENT: PERRLA, EOM intact Pulmonary: No wheezing, No rales decreased air entry bilaterally. CardiovascularNormal S1,S2.  No m/r/g.  Abdomen: Benign, Soft, non-tender, No masses Renal:  No costovertebral tenderness  GU:  No performed at this time. Endoc: No evident thyromegaly, no signs of acromegaly or Cushing features Skin:   warm, no rashes, no ecchymosis  Extremities: normal, no cyanosis, clubbing.      LABORATORY PANEL:   CBC No results for input(s): WBC, HGB, HCT, PLT in the last 168 hours. ------------------------------------------------------------------------------------------------------------------  Chemistries  No results for input(s): NA, K, CL, CO2, GLUCOSE, BUN, CREATININE, CALCIUM, MG, AST, ALT, ALKPHOS, BILITOT in the last 168 hours.  Invalid input(s):  GFRCGP ------------------------------------------------------------------------------------------------------------------  Cardiac Enzymes No results for input(s): TROPONINI in the last 168 hours. ------------------------------------------------------------  RADIOLOGY:  No results found.     Thank  you for the consultation and for allowing [facility] to assist in the care of your patient. Our recommendations are noted above.  Please contact us if we can be of further service.  Marda Stalker, M.D., F.C.C.P.  Board Certified in Internal Medicine, Pulmonary Medicine, [place], and Sleep Medicine.  [facility] Office Number: [phone]   11/21/2017

## 2017-11-22 ENCOUNTER — Encounter: Payer: Self-pay | Admitting: Internal Medicine

## 2017-11-22 ENCOUNTER — Ambulatory Visit (INDEPENDENT_AMBULATORY_CARE_PROVIDER_SITE_OTHER): Payer: Medicare Other | Admitting: Internal Medicine

## 2017-11-22 VITALS — BP 140/76 | HR 61 | Resp 16 | Ht 68.0 in | Wt 215.0 lb

## 2017-11-22 DIAGNOSIS — J449 Chronic obstructive pulmonary disease, unspecified: Secondary | ICD-10-CM

## 2017-11-22 MED ORDER — AZITHROMYCIN 250 MG PO TABS: 250.0000 mg | ORAL_TABLET | Freq: Every day | ORAL | 0 refills | Status: DC

## 2017-11-22 MED ORDER — UMECLIDINIUM-VILANTEROL 62.5-25 MCG/INH IN AEPB: 1.0000 | INHALATION_SPRAY | Freq: Every day | RESPIRATORY_TRACT | 5 refills | Status: DC

## 2017-11-22 NOTE — Patient Instructions (Addendum)
Start azithromycin 250 mg every weekday (Monday-Friday) 5 days per week, stay on it.  Stop advair and incruse, start Anoro.

## 2017-11-23 ENCOUNTER — Telehealth: Payer: Self-pay | Admitting: Internal Medicine

## 2017-11-23 NOTE — Telephone Encounter (Signed)
Patient daughter calling stating patient saw Dr Ashby Dawes yesterday and we prescribed them Anoro  She was reading over the side effects and it said that if you had glaucoma that it could worsen it  Would like a call back for she states we may not have known about that before giving it to patient She states we can call patient back and let him know

## 2017-11-24 NOTE — Addendum Note (Signed)
Addended by: Wilburt Finlay L on: 11/24/2017 10:15 AM   Modules accepted: Orders

## 2017-11-24 NOTE — Telephone Encounter (Signed)
It is only contraindicated for a rare type of glaucoma called Closed angle glaucoma, if this is the glaucoma that you have you should not take it. Otherwise it is fine.

## 2017-11-24 NOTE — Telephone Encounter (Signed)
Spoke with patient daughter and made aware of recommendation on Anoro with glaucoma. She will find out type father has and let us know if inhaler needs to be changed. Nothing further needed.

## 2017-12-09 ENCOUNTER — Telehealth: Payer: Self-pay | Admitting: Family Medicine

## 2017-12-09 NOTE — Telephone Encounter (Signed)
Pt is scheduled for hospital f/u on 12/19/17. Pt is being discharged from [facility] sometime this weekend. Thanks TNP

## 2017-12-12 ENCOUNTER — Other Ambulatory Visit: Payer: Self-pay | Admitting: Internal Medicine

## 2017-12-12 NOTE — Telephone Encounter (Signed)
No I'm sorry, all I see is the July admission.

## 2017-12-12 NOTE — Telephone Encounter (Signed)
Do you know he admit and d/c date? He was not on my list. I saw the [facility] summary but did not see dates listed.  Thanks, MM

## 2017-12-12 NOTE — Telephone Encounter (Signed)
McKenzie, Here is another one.  Looks like he was at [facility], not sure if you get those. Thanks [employer]

## 2017-12-12 NOTE — Telephone Encounter (Signed)
Transition Care Management Follow-up Telephone Call  Date of discharge and from where: [facility] on 12/10/17  How have you been since you were released from the hospital? Still has SOB with exertion. Has been using the nebulizer 4 times a day and inhalers daily and as needed. Pt declined pain, chest tightness, fever or n/v/d.  Any questions or concerns? No   Items Reviewed:  Did the pt receive and understand the discharge instructions provided? Yes   Medications obtained and verified? Unsure of certain ones, will review at [place] apt.  Any new allergies since your discharge? No , n/a  Dietary orders reviewed? No  Do you have support at home? 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   Transferring/Ambulation- Uses a walker to get around home.   Managing Meds- Son manages.   Follow up appointments reviewed:    PCP Hospital f/u appt confirmed? Yes  Scheduled to see [name] on 12/19/17 @ 2:40 PM.  [facility] f/u appt confirmed? N/A  Are transportation arrangements needed? No   If their condition worsens, is the pt aware to call  their PCP or go to the ED? Yes  Was the patient provided with contact information for the PCP's office or ED? Yes  Was the pt encouraged to call back with questions or concerns? Yes

## 2017-12-19 ENCOUNTER — Ambulatory Visit (INDEPENDENT_AMBULATORY_CARE_PROVIDER_SITE_OTHER): Payer: Medicare Other | Admitting: Family Medicine

## 2017-12-19 ENCOUNTER — Encounter: Payer: Self-pay | Admitting: Family Medicine

## 2017-12-19 VITALS — BP 128/70 | HR 72 | Temp 98.4°F | Resp 16 | Wt 220.0 lb

## 2017-12-19 DIAGNOSIS — J441 Chronic obstructive pulmonary disease with (acute) exacerbation: Secondary | ICD-10-CM

## 2017-12-19 DIAGNOSIS — M158 Other polyosteoarthritis: Secondary | ICD-10-CM | POA: Diagnosis not present

## 2017-12-19 DIAGNOSIS — H3530 Unspecified macular degeneration: Secondary | ICD-10-CM

## 2017-12-19 DIAGNOSIS — Z6833 Body mass index (BMI) 33.0-33.9, adult: Secondary | ICD-10-CM

## 2017-12-19 DIAGNOSIS — I2510 Atherosclerotic heart disease of native coronary artery without angina pectoris: Secondary | ICD-10-CM

## 2017-12-19 DIAGNOSIS — E6609 Other obesity due to excess calories: Secondary | ICD-10-CM

## 2017-12-19 NOTE — Progress Notes (Signed)
Patient: Johnathan [name] Male    DOB: [birth year]   84 y.o.   MRN: [id number] Visit Date: 12/19/2017  Today's Provider: Wilhemena Durie, MD   Chief Complaint  Patient presents with  . Hospitalization Follow-up   Subjective:    HPI  Follow up Hospitalization Transition care visit. Patient was admitted to [facility] on 12/08/17 and discharged on 12/10/17. He was treated for pneumonia. Treatment for this included starting azithromycin M-F and increased Advair to 500/60mcg.  He reports good compliance with treatment. He reports this condition is Improved.   Patient also needs a Rx for a portable oxygen tank. He reports that he uses 2L of O2 at home.   Allergies  Allergen Reactions  . Sulfa Antibiotics Other (See Comments)    rash  . Penicillins Rash    Has patient had a PCN reaction causing immediate rash, facial/tongue/throat swelling, SOB or lightheadedness with hypotension: No Has patient had a PCN reaction causing severe rash involving mucus membranes or skin necrosis: No Has patient had a PCN reaction that required hospitalization: No Has patient had a PCN reaction occurring within the last 10 years: No If all of the above answers are "NO", then may proceed with Cephalosporin use.      Current Outpatient Medications:  .  albuterol (ACCUNEB) 0.63 MG/3ML nebulizer solution, Take 3 mLs (0.63 mg total) by nebulization every 4 (four) hours as needed for wheezing. J44.9, Disp: 75 mL, Rfl: 11 .  albuterol (PROVENTIL HFA;VENTOLIN HFA) 108 (90 Base) MCG/ACT inhaler, Inhale 2 puffs into the lungs every 6 (six) hours as needed for wheezing or shortness of breath., Disp: 1 Inhaler, Rfl: 1 .  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  amLODipine (NORVASC) 5 MG tablet, TAKE 1 TABLET DAILY, Disp: 90 tablet, Rfl: 3 .  aspirin 325 MG EC tablet, Take 325 mg by mouth daily., Disp: , Rfl:  .  azithromycin (ZITHROMAX) 250 MG tablet,  Take 1 tablet (250 mg total) by mouth daily. 1 tablet 5 days per week (Monday-Friday), Disp: 90 tablet, Rfl: 0 .  azithromycin (ZITHROMAX) 250 MG tablet, TAKE 1 TABLET BY MOUTH 5 DAYS PER WEEK. (MONDAY-FRIDAY), Disp: 30 each, Rfl: 2 .  brimonidine (ALPHAGAN) 0.2 % ophthalmic solution, Place 1 drop into both eyes 2 (two) times daily. , Disp: , Rfl:  .  esomeprazole (NEXIUM) 20 MG capsule, TAKE 1 CAPSULE TWICE A DAY, Disp: 180 capsule, Rfl: 2 .  Fluticasone-Salmeterol (ADVAIR DISKUS) 250-50 MCG/DOSE AEPB, Inhale 1 puff into the lungs 2 (two) times daily., Disp: 3 each, Rfl: 2 .  INCRUSE ELLIPTA 62.5 MCG/INH AEPB, Inhale 1 puff into the lungs daily., Disp: , Rfl:  .  latanoprost (XALATAN) 0.005 % ophthalmic solution, Place 1 drop into both eyes at bedtime. , Disp: , Rfl:  .  Polyethylene Glycol 3350 (MIRALAX PO), Take by mouth as needed., Disp: , Rfl:  .  prednisoLONE acetate (PRED FORTE) 1 % ophthalmic suspension, Place 1 drop into the left eye daily. , Disp: , Rfl:  .  simvastatin (ZOCOR) 20 MG tablet, TAKE 1 TABLET DAILY, Disp: 90 tablet, Rfl: 3 .  traMADol (ULTRAM) 50 MG tablet, Take 1 tablet (50 mg total) by mouth every 4 (four) hours as needed., Disp: 100 tablet, Rfl: 5 .  traZODone (DESYREL) 50 MG tablet, Take 0.5-1 tablets (25-50 mg total) by mouth at bedtime as needed for sleep., Disp: 90 tablet, Rfl: 1 .  umeclidinium-vilanterol (ANORO ELLIPTA) 62.5-25 MCG/INH AEPB, Inhale 1 puff into the lungs daily., Disp: 1 each, Rfl: 5  Current Facility-Administered Medications:  .  ipratropium-albuterol (DUONEB) 0.5-2.5 (3) MG/3ML nebulizer solution 3 mL, 3 mL, Nebulization, Once, Jerrol Banana., MD  Review of Systems  Constitutional: Negative for activity change, appetite change, chills, diaphoresis, fatigue and fever.  HENT: Negative.   Eyes: Negative.   Respiratory: Negative for cough and shortness of breath.   Cardiovascular: Negative for chest pain, palpitations and leg swelling.    Gastrointestinal: Negative.   Endocrine: Negative for cold intolerance, heat intolerance, polydipsia, polyphagia and polyuria.  Genitourinary: Negative.   Musculoskeletal: Positive for arthralgias.  Allergic/Immunologic: Negative.   Neurological: Negative for dizziness, light-headedness and headaches.  Psychiatric/Behavioral: Negative for agitation and decreased concentration. The patient is not nervous/anxious.     Social History   Tobacco Use  . Smoking status: Former Smoker    Packs/day: 1.00    Years: 30.00    Pack years: 30.00    Types: Cigarettes    Last attempt to quit: 02/16/2003    Years since quitting: 14.8  . Smokeless tobacco: Never Used  . Tobacco comment: is exposed to second hand smoke  Substance Use Topics  . Alcohol use: No   Objective:   BP 128/70   Pulse 72   Temp 98.4 F (36.9 C)   Resp 16   Wt 220 lb (99.8 kg)   SpO2 96%   BMI 33.45 kg/m  Vitals:   12/19/17 1505  BP: 128/70  Pulse: 72  Resp: 16  Temp: 98.4 F (36.9 C)  SpO2: 96%  Weight: 220 lb (99.8 kg)     Physical Exam  Constitutional: He is oriented to person, place, and time. He appears well-developed and well-nourished.  HENT:  Head: Normocephalic and atraumatic.  Right Ear: External ear normal.  Left Ear: External ear normal.  Nose: Nose normal.  Eyes: Conjunctivae are normal. No scleral icterus.  Neck: No thyromegaly present.  Cardiovascular: Normal rate, regular rhythm and normal heart sounds.  Pulmonary/Chest: Effort normal and breath sounds normal.  Abdominal: Soft.  Musculoskeletal: He exhibits no edema.  Neurological: He is alert and oriented to person, place, and time.  Skin: Skin is warm and dry.  Psychiatric: He has a normal mood and affect. His behavior is normal. Judgment and thought content normal.        Assessment & Plan:     1. Chronic obstructive pulmonary disease with acute exacerbation (HCC) Pt slow to recover. Not using nebulizer at home--instructed to  use 4 times per day for now. Neb in  Office helps him today.  2. ASCVD (arteriosclerotic cardiovascular disease) All risk factors treated.  3. Other osteoarthritis involving multiple joints   4. Age-related macular degeneration   5. Class 1 obesity due to excess calories with serious comorbidity and body mass index (BMI) of 33.0 to 33.9 in adult       I have done the exam and reviewed the above chart and it is accurate to the best of my knowledge. Development worker, community has been used in this note in any air is in the dictation or transcription are unintentional.  Wilhemena Durie, MD  [place]

## 2018-01-03 ENCOUNTER — Telehealth: Payer: Self-pay | Admitting: Family Medicine

## 2018-01-03 MED ORDER — FLUTICASONE-SALMETEROL 250-50 MCG/DOSE IN AEPB: 1.0000 | INHALATION_SPRAY | Freq: Two times a day (BID) | RESPIRATORY_TRACT | 2 refills | Status: DC

## 2018-01-03 NOTE — Telephone Encounter (Signed)
Pt's son is requesting a refill of generic Advair 500/50 twice daily that was given to the patient in the hospital.  Pt only has 14 does and appt with pulmonology isn't until Jan.   [facility]

## 2018-01-03 NOTE — Telephone Encounter (Signed)
Ok to refill 

## 2018-01-03 NOTE — Telephone Encounter (Signed)
ok 

## 2018-01-05 ENCOUNTER — Ambulatory Visit: Payer: Self-pay | Admitting: Family Medicine

## 2018-01-05 ENCOUNTER — Telehealth: Payer: Self-pay | Admitting: Family Medicine

## 2018-01-05 MED ORDER — FLUTICASONE-SALMETEROL 250-50 MCG/DOSE IN AEPB: 1.0000 | INHALATION_SPRAY | Freq: Two times a day (BID) | RESPIRATORY_TRACT | 2 refills | Status: DC

## 2018-01-05 NOTE — Telephone Encounter (Signed)
Pt needing refills on:  Fluticasone-Salmeterol (ADVAIR DISKUS) 250-50 MCG/DOSE AEPB - Pt only has 11 puffs left.  There was also a Rx - Spiriva with HandiHaler  that pt was prescribed at most recent hospital visit.  Needing a refill on this as well.   Please fill at: [place], [place] - [address] [phone] (Phone) [phone] (Fax)   Thanks, [place]

## 2018-01-09 ENCOUNTER — Telehealth: Payer: Self-pay | Admitting: Family Medicine

## 2018-01-09 ENCOUNTER — Other Ambulatory Visit: Payer: Self-pay

## 2018-01-09 MED ORDER — FLUTICASONE-SALMETEROL 500-50 MCG/DOSE IN AEPB: 1.0000 | INHALATION_SPRAY | Freq: Two times a day (BID) | RESPIRATORY_TRACT | 12 refills | Status: DC

## 2018-01-09 NOTE — Telephone Encounter (Signed)
Patient is to be on Advair 500/50 and 250/50 was called in.  Will you please change this ASAP to Adviar 500/50 and call to [facility].  His son is going there today and would like to get this.

## 2018-01-24 ENCOUNTER — Other Ambulatory Visit: Payer: Self-pay | Admitting: Family Medicine

## 2018-01-24 NOTE — Telephone Encounter (Signed)
Patient needs refills ASAP sent to [facility]   Advair 500/50 mg. Spiriva Handy hold  Duoneb Solution 0.5-2.5 mg.

## 2018-01-25 MED ORDER — FLUTICASONE-SALMETEROL 500-50 MCG/DOSE IN AEPB
1.0000 | INHALATION_SPRAY | Freq: Two times a day (BID) | RESPIRATORY_TRACT | 12 refills | Status: DC
Start: 2018-01-25 — End: 2018-01-30

## 2018-01-25 MED ORDER — IPRATROPIUM-ALBUTEROL 0.5-2.5 (3) MG/3ML IN SOLN: 3.0000 mL | RESPIRATORY_TRACT | 3 refills | Status: DC | PRN

## 2018-01-27 ENCOUNTER — Other Ambulatory Visit: Payer: Self-pay | Admitting: *Deleted

## 2018-01-27 MED ORDER — AZITHROMYCIN 250 MG PO TABS: 250.0000 mg | ORAL_TABLET | Freq: Every day | ORAL | 1 refills | Status: DC

## 2018-01-30 ENCOUNTER — Other Ambulatory Visit: Payer: Self-pay | Admitting: Family Medicine

## 2018-01-30 MED ORDER — FLUTICASONE-SALMETEROL 500-50 MCG/DOSE IN AEPB: 1.0000 | INHALATION_SPRAY | Freq: Two times a day (BID) | RESPIRATORY_TRACT | 12 refills | Status: DC

## 2018-01-30 NOTE — Telephone Encounter (Signed)
[facility] faxed refill request for the following medications:  Spiriva Handihaler Caps 56mcg Qty: 90 day supply Refills: 4   Advair Diskus 60's 500/50 Qty: 90 day supply Refills: 4  Please advise.

## 2018-01-30 NOTE — Telephone Encounter (Signed)
done

## 2018-02-01 ENCOUNTER — Other Ambulatory Visit: Payer: Self-pay | Admitting: *Deleted

## 2018-02-01 ENCOUNTER — Other Ambulatory Visit: Payer: Self-pay | Admitting: Family Medicine

## 2018-02-01 DIAGNOSIS — M545 Low back pain, unspecified: Secondary | ICD-10-CM

## 2018-02-01 MED ORDER — AZITHROMYCIN 250 MG PO TABS: ORAL_TABLET | ORAL | 0 refills | Status: DC

## 2018-02-02 ENCOUNTER — Telehealth: Payer: Self-pay | Admitting: *Deleted

## 2018-02-02 MED ORDER — IPRATROPIUM-ALBUTEROL 0.5-2.5 (3) MG/3ML IN SOLN: 3.0000 mL | RESPIRATORY_TRACT | 3 refills | Status: DC | PRN

## 2018-02-02 NOTE — Telephone Encounter (Signed)
Pt and son stopped by office Request refill on Duoneb to be sent to [facility]. Also when he was hospitalized he was put on Spiriva Handihaler. He is benefiting from medication and would like sent to [facility]. Pt aware DR out of office until 02/21/18.

## 2018-02-03 MED ORDER — TIOTROPIUM BROMIDE MONOHYDRATE 18 MCG IN CAPS: 18.0000 ug | ORAL_CAPSULE | Freq: Every day | RESPIRATORY_TRACT | 2 refills | Status: DC

## 2018-02-03 NOTE — Telephone Encounter (Signed)
Ok on both?

## 2018-02-03 NOTE — Telephone Encounter (Signed)
Rx Spiriva handihaler sent. Once printed neb rx signed will placed on [facility] desk to fax to [facility].

## 2018-02-06 ENCOUNTER — Other Ambulatory Visit: Payer: Self-pay | Admitting: Internal Medicine

## 2018-02-06 MED ORDER — IPRATROPIUM-ALBUTEROL 0.5-2.5 (3) MG/3ML IN SOLN: 3.0000 mL | RESPIRATORY_TRACT | 3 refills | Status: DC | PRN

## 2018-02-06 NOTE — Addendum Note (Signed)
Addended by: Stephanie Coup on: 02/06/2018 02:45 PM   Modules accepted: Orders

## 2018-02-10 ENCOUNTER — Other Ambulatory Visit: Payer: Self-pay

## 2018-02-16 ENCOUNTER — Other Ambulatory Visit: Payer: Self-pay | Admitting: Internal Medicine

## 2018-02-16 MED ORDER — IPRATROPIUM-ALBUTEROL 0.5-2.5 (3) MG/3ML IN SOLN: RESPIRATORY_TRACT | 3 refills | Status: DC

## 2018-02-20 ENCOUNTER — Ambulatory Visit
Admission: RE | Admit: 2018-02-20 | Discharge: 2018-02-20 | Disposition: A | Discharge status: Home / Self Care | Payer: Medicare Other | Source: Ambulatory Visit | Attending: Family Medicine | Admitting: Family Medicine

## 2018-02-20 ENCOUNTER — Ambulatory Visit
Admission: RE | Admit: 2018-02-20 | Discharge: 2018-02-20 | Disposition: A | Discharge status: Home / Self Care | Payer: Medicare Other | Attending: Family Medicine | Admitting: Family Medicine

## 2018-02-20 ENCOUNTER — Ambulatory Visit (INDEPENDENT_AMBULATORY_CARE_PROVIDER_SITE_OTHER): Payer: Medicare Other | Admitting: Family Medicine

## 2018-02-20 VITALS — BP 143/91 | HR 79 | Temp 97.9°F | Resp 20 | Wt 219.0 lb

## 2018-02-20 DIAGNOSIS — I255 Ischemic cardiomyopathy: Secondary | ICD-10-CM | POA: Diagnosis not present

## 2018-02-20 DIAGNOSIS — J189 Pneumonia, unspecified organism: Secondary | ICD-10-CM

## 2018-02-20 DIAGNOSIS — J90 Pleural effusion, not elsewhere classified: Secondary | ICD-10-CM | POA: Diagnosis not present

## 2018-02-20 DIAGNOSIS — J441 Chronic obstructive pulmonary disease with (acute) exacerbation: Secondary | ICD-10-CM

## 2018-02-20 MED ORDER — PREDNISONE 10 MG (21) PO TBPK: ORAL_TABLET | ORAL | 1 refills | Status: DC

## 2018-02-20 MED ORDER — DOXYCYCLINE HYCLATE 100 MG PO TABS: 100.0000 mg | ORAL_TABLET | Freq: Two times a day (BID) | ORAL | 0 refills | Status: DC

## 2018-02-20 NOTE — Progress Notes (Signed)
Johnathan [name]  MRN: [id number] DOB: 11/03/1925  Subjective:  HPI   Patient presented tot he office with request of getting a chest x-ray.  He is accompanied by his son who states he thinks the patient has pneumonia.  The patient instructed to get the x-ray and then come back to the office to be seen.   Patient has been having increasing trouble breathing for several weeks.  He states that he had started coughing with minimal production and that is why he came in today.  He has had no fever her hemoptysis.  He does not feel sick today.  He is having no chest pain, no orthopnea /PND, no anginal symptoms. Patient Active Problem List   Diagnosis Date Noted  . Acute calculous cholecystitis 08/31/2017  . Chronic kidney disease (CKD), stage III (moderate) ([place]) 08/27/2017  . Pneumonia 08/25/2017  . Acute respiratory failure ([place]) 07/23/2017  . Asthma exacerbation 03/11/2017  . Vertigo 06/01/2016  . Dizziness 05/23/2016  . Renal artery stenosis ([place]) 12/29/2015  . Aneurysm of thoracic aorta ([place]) 12/29/2015  . Superior mesenteric artery stenosis ([place]) 12/29/2015  . Hydrocele, bilateral 09/09/2014  . Hydrocele sac 08/22/2014  . History of prostate cancer 08/22/2014  . ASCVD (arteriosclerotic cardiovascular disease) 08/06/2014  . Cardiomyopathy, ischemic 08/06/2014  . Prostate cancer ([place]) 08/06/2014  . Obesity 08/06/2014  . Insomnia 08/06/2014  . Neuropathy 08/06/2014  . Abdominal aortic aneurysm ([place]) 08/06/2014  . COPD (chronic obstructive pulmonary disease) ([place]) 08/06/2014  . Hyperglycemia 08/06/2014  . Acid reflux 08/05/2014  . Age-related macular degeneration 08/05/2014  . Branch retinal vein occlusion 08/05/2014  . 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 Hypercholesteremia 08/05/2014  . Pseudoaphakia 08/05/2014  . Secondary glaucoma 08/05/2014  . Osteoarthritis 08/05/2014  . Carotid stenosis 04/02/2014  . PAD (peripheral artery disease) ([place]) 04/02/2014  . 3-vessel CAD  11/06/2013  . Colonic stricture ([place]) 02/21/2013  . BP (high blood pressure) 11/22/2012  . BK (bullous keratopathy) 06/28/2012    Past Medical History:  Diagnosis Date  . Abdominal pain    INTERMITTENT  . ARMD (age related macular degeneration)   . Arthritis    hands  . Asthma   . Atherosclerosis of native arteries of the extremities, unspecified   . CAD (coronary artery disease)    3 vessel/ DR FATH CARDIOLOGIST GAVE CLEARANCE  . Colitis    HX OF  . Constipation   . COPD (chronic obstructive pulmonary disease) ([place])   . GERD (gastroesophageal reflux disease)   . Glaucoma    both eyes/ PATIENT CAN NOT SEE  . HOH (hard of hearing)   . Hyperlipidemia   . Hypertension   . Neuromuscular disorder (HCC)    numbness in feet  . Occlusion and stenosis of carotid artery without mention of cerebral infarction   . Prostate cancer ([place])   . Prostate pain   . Shortness of breath dyspnea   . TIA (transient ischemic attack)    hx of/ 10 yrs ago  . Vertigo   . Wears dentures    upper and lower    Social History   Socioeconomic History  . Marital status: Widowed    Spouse name: Not on file  . Number of children: 4  . Years of education: Not on file  . Highest education level: Not on file  Occupational History  . Occupation: retired  Scientific laboratory technician  . Financial resource strain: Not on file  . Food insecurity:    Worry: Not on file  Inability: Not on file  . Transportation needs:    Medical: Not on file    Non-medical: Not on file  Tobacco Use  . Smoking status: Former Smoker    Packs/day: 1.00    Years: 30.00    Pack years: 30.00    Types: Cigarettes    Last attempt to quit: 02/16/2003    Years since quitting: 15.0  . Smokeless tobacco: Never Used  . Tobacco comment: is exposed to second hand smoke  Substance and Sexual Activity  . Alcohol use: No  . Drug use: No  . Sexual activity: Not Currently  Lifestyle  . Physical activity:    Days per week: Not on file     Minutes per session: Not on file  . Stress: Not on file  Relationships  . Social connections:    Talks on phone: Not on file    Gets together: Not on file    Attends religious service: Not on file    Active member of club or organization: Not on file    Attends meetings of clubs or organizations: Not on file    Relationship status: Not on file  . Intimate partner violence:    Fear of current or ex partner: Not on file    Emotionally abused: Not on file    Physically abused: Not on file    Forced sexual activity: Not on file  Other Topics Concern  . Not on file  Social History Narrative  . Not on file    Outpatient Encounter Medications as of 02/20/2018  Medication Sig  . albuterol (ACCUNEB) 0.63 MG/3ML nebulizer solution USE 1 VIAL BY NEBULIZER EVERY 4 HOURS ASNEEDED FOR WHEEZING  . albuterol (PROVENTIL HFA;VENTOLIN HFA) 108 (90 Base) MCG/ACT inhaler Inhale 2 puffs into the lungs every 6 (six) hours as needed for wheezing or shortness of breath.  . 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 amLODipine (NORVASC) 5 MG tablet TAKE 1 TABLET DAILY  . aspirin 325 MG EC tablet Take 325 mg by mouth daily.  [name] azithromycin (ZITHROMAX) 250 MG tablet Take by mouth daily. 1 daily Monday through Friday, none on Saturday and Sunday  . brimonidine (ALPHAGAN) 0.2 % ophthalmic solution Place 1 drop into both eyes 2 (two) times daily.   [name] esomeprazole (NEXIUM) 20 MG capsule TAKE 1 CAPSULE TWICE A DAY  . Fluticasone-Salmeterol (ADVAIR) 500-50 MCG/DOSE AEPB Inhale 1 puff into the lungs 2 (two) times daily.  . INCRUSE ELLIPTA 62.5 MCG/INH AEPB Inhale 1 puff into the lungs daily.  [name] latanoprost (XALATAN) 0.005 % ophthalmic solution Place 1 drop into both eyes at bedtime.   . Polyethylene Glycol 3350 (MIRALAX PO) Take by mouth as needed.  . prednisoLONE acetate (PRED FORTE) 1 % ophthalmic suspension Place 1 drop into the left eye daily.   . simvastatin (ZOCOR) 20 MG  tablet TAKE 1 TABLET DAILY  . tiotropium (SPIRIVA HANDIHALER) 18 MCG inhalation capsule Place 1 capsule (18 mcg total) into inhaler and inhale daily.  . traMADol (ULTRAM) 50 MG tablet TAKE 1 TABLET BY MOUTH EVERY 4 HOURS AS NEEDED  . traZODone (DESYREL) 50 MG tablet Take 0.5-1 tablets (25-50 mg total) by mouth at bedtime as needed for sleep.  [name] ipratropium-albuterol (DUONEB) 0.5-2.5 (3) MG/3ML SOLN Take 3 mls by nebulization every 4 hours and prn. DX:J44.9 (Patient not taking: Reported on 02/20/2018)  . umeclidinium-vilanterol (ANORO ELLIPTA) 62.5-25 MCG/INH AEPB Inhale 1 puff into the lungs daily. (Patient not taking: Reported  on 02/20/2018)  . [DISCONTINUED] azithromycin (ZITHROMAX) 250 MG tablet 1 tablet by mouth Mon-Fri.   No facility-administered encounter medications on file as of 02/20/2018.     Allergies  Allergen Reactions  . Sulfa Antibiotics Other (See Comments)    rash  . Penicillins Rash    Has patient had a PCN reaction causing immediate rash, facial/tongue/throat swelling, SOB or lightheadedness with hypotension: No Has patient had a PCN reaction causing severe rash involving mucus membranes or skin necrosis: No Has patient had a PCN reaction that required hospitalization: No Has patient had a PCN reaction occurring within the last 10 years: No If all of the above answers are "NO", then may proceed with Cephalosporin use.     Review of Systems  Constitutional: Positive for malaise/fatigue. Negative for fever.  HENT: Negative.   Eyes: Negative.   Respiratory: Positive for cough, sputum production (minimal), shortness of breath and wheezing.   Cardiovascular: Negative for chest pain, palpitations, orthopnea, claudication and leg swelling.  Gastrointestinal: Negative.   Skin: Negative.   Neurological: Negative.   Endo/Heme/Allergies: Negative.   Psychiatric/Behavioral: Negative.     Objective:  BP (!) 143/91 (BP Location: Right Arm, Patient Position: Sitting, Cuff Size:  Large)   Pulse 79   Temp 97.9 F (36.6 C) (Oral)   Resp 20   Wt 219 lb (99.3 kg)   SpO2 94%   BMI 33.30 kg/m   Physical Exam  Constitutional: He is oriented to person, place, and time and well-developed, well-nourished, and in no distress.  Looks better today  HENT:  Head: Normocephalic and atraumatic.  Right Ear: External ear normal.  Left Ear: External ear normal.  Nose: Nose normal.  Eyes: Pupils are equal, round, and reactive to light. Conjunctivae are normal.  Neck: No thyromegaly present.  Cardiovascular: Normal rate, regular rhythm, normal heart sounds and intact distal pulses. Exam reveals no gallop.  No murmur heard. Pulmonary/Chest: Effort normal and breath sounds normal.  Diffuse expiratory wheezes and some rhonchi  Abdominal: Soft.  Neurological: He is alert and oriented to person, place, and time. Gait abnormal. GCS score is 15.  Using wheelchair to ambulate.  Skin: Skin is warm and dry.  Psychiatric: Mood, memory, affect and judgment normal.    Assessment and Plan :   1. Pneumonia due to infectious organism, unspecified laterality, unspecified part of lung  - DG Chest 2 View; Future  2. Chronic obstructive pulmonary disease with acute exacerbation ([place]) Patient is clinically stable at this time.  May need to see pulmonology if he does not improve.  At this time will treat with doxycycline and a prednisone 10 mg 6-day taper.  Told to go to directly to the ED if he clinically worsens with shortness of breath or fevers Patient not hypoxic today.  O2 sat is 94%. 3. Cardiomyopathy, ischemic Clinically stable at this point in time.  4. Pleural effusion, left     HPI, Exam and A&P Transcribed under the direction and in the presence of Miguel Aschoff, Brooke Bonito., MD. Electronically Signed: Althea Charon, RMA I have done the exam and reviewed the chart and it is accurate to the best of my knowledge. Development worker, community has been used and  any errors in dictation or  transcription are unintentional. Miguel Aschoff M.D. [facility]

## 2018-02-22 ENCOUNTER — Ambulatory Visit: Payer: Medicare Other | Admitting: Family Medicine

## 2018-02-25 ENCOUNTER — Other Ambulatory Visit: Payer: Self-pay | Admitting: Family Medicine

## 2018-02-28 ENCOUNTER — Ambulatory Visit: Payer: Self-pay | Admitting: Family Medicine

## 2018-03-24 NOTE — Progress Notes (Signed)
[facility] Consultation      Assessment and Plan:  85 year old male with progressive dyspnea on exertion, now severe, with severe emphysema and  hospitalization for COPD exacerbation.  COPD, group D. -Continued multiple hospital admissions for COPD/pneumonia. - Continue Advair, Spiriva. --Reiterated keep pets out of bedroom.  --Patient remains at high risk of exacerbation, hospitalization, death. --Continue chronic azithromycin 250 mg Monday through Friday indefinitely.  Deconditioning with severe exertional dyspnea. -Discussed pulmonary rehab, he had previously been in pulmonary rehab but stopped after an exacerbation. -Encouraged him to re-enroll.    Return in about 6 months (around 09/25/2018).   Date: 03/24/2018  MRN# [id number] Johnathan [name] 12-Apr-1925   Johnathan [name] is a 85 y.o. old male seen in consultation for chief complaint of:    Chief Complaint  Patient presents with  . Follow-up    pt reports of dry cough at times prod (unsure of color), wheezing & sob with exertion.    HPI:   The patient is an 85 year old male with a history of multiple COPD exacerbations.  At last visit he was asked to continue Advair, nebulizers, albuterol, spiriva respimat, flutter valve, sent for overnight oximetry.    SInce his last visit he has not been back to the hospital.He has continued on chronic azithro.  He feels that the breathing is doing ok but he still can not walk very far because he gets very winded with just walking around the house, bathing, dressing.  He has started pulm rehab but did not complete because he got sick.  He is using spiriva daily, advair bid.  He is doing albuterol neb once daily and feels that it helps.   He has a cat at home, sleeps in bed with him.  He has some reflux, he takes nexium which helps.  He does have some sinus drainage, he does not take anything for it.   **chest x-ray 03/16/17>> hyperinflation consistent with emphysema,  increased interstitial markings in both bases likely some degree of interstitial scarring. **CT chest 07/01/15>> mild emphysema.  **CBC 05/09/2017>> absolute eosinophil count equals 100. **Full PFT 03/22/17>> FVC 68%, FEV1 48% predicted, there is no improvement with bronchodilator, flow volume loop appears obstructed. TLC 79% predicted, RV to TLC ratio is normal.  Diffusion capacity is 51%. -Overall this test is consistent with severe obstructive lung disease. **Echocardiogram 05/24/2016>> EF 45%. **Desat walk on rest on RA sat is 92% and HR 51. Walked 150 feet, sat dropped to 89%, HR 75, severe dyspnea, poor gait.  Social Hx:   Social History   Tobacco Use  . Smoking status: Former Smoker    Packs/day: 1.00    Years: 30.00    Pack years: 30.00    Types: Cigarettes    Last attempt to quit: 02/16/2003    Years since quitting: 15.1  . Smokeless tobacco: Never Used  . Tobacco comment: is exposed to second hand smoke  Substance Use Topics  . Alcohol use: No  . Drug use: No   Medication:    Current Outpatient Medications:  .  albuterol (ACCUNEB) 0.63 MG/3ML nebulizer solution, USE 1 VIAL BY NEBULIZER EVERY 4 HOURS ASNEEDED FOR WHEEZING, Disp: 90 mL, Rfl: 3 .  albuterol (PROVENTIL HFA;VENTOLIN HFA) 108 (90 Base) MCG/ACT inhaler, Inhale 2 puffs into the lungs every 6 (six) hours as needed for wheezing or shortness of breath., Disp: 1 Inhaler, Rfl: 1 .  AMBULATORY NON FORMULARY MEDICATION, Medication Name: Flutter valve DX:J44.9, Disp: 1 each, Rfl:  0 .  amLODipine (NORVASC) 5 MG tablet, TAKE 1 TABLET DAILY, Disp: 90 tablet, Rfl: 3 .  aspirin 325 MG EC tablet, Take 325 mg by mouth daily., Disp: , Rfl:  .  azithromycin (ZITHROMAX) 250 MG tablet, Take by mouth daily. 1 daily Monday through Friday, none on Saturday and Sunday, Disp: , Rfl:  .  brimonidine (ALPHAGAN) 0.2 % ophthalmic solution, Place 1 drop into both eyes 2 (two) times daily. , Disp: , Rfl:  .  doxycycline (VIBRA-TABS) 100 MG tablet,  Take 1 tablet (100 mg total) by mouth 2 (two) times daily., Disp: 20 tablet, Rfl: 0 .  esomeprazole (NEXIUM) 20 MG capsule, TAKE 1 CAPSULE TWICE A DAY, Disp: 180 capsule, Rfl: 2 .  Fluticasone-Salmeterol (ADVAIR) 500-50 MCG/DOSE AEPB, Inhale 1 puff into the lungs 2 (two) times daily., Disp: 180 each, Rfl: 12 .  INCRUSE ELLIPTA 62.5 MCG/INH AEPB, Inhale 1 puff into the lungs daily., Disp: , Rfl:  .  ipratropium-albuterol (DUONEB) 0.5-2.5 (3) MG/3ML SOLN, Take 3 mls by nebulization every 4 hours and prn. DX:J44.9 (Patient not taking: Reported on 02/20/2018), Disp: 360 mL, Rfl: 3 .  latanoprost (XALATAN) 0.005 % ophthalmic solution, Place 1 drop into both eyes at bedtime. , Disp: , Rfl:  .  Polyethylene Glycol 3350 (MIRALAX PO), Take by mouth as needed., Disp: , Rfl:  .  prednisoLONE acetate (PRED FORTE) 1 % ophthalmic suspension, Place 1 drop into the left eye daily. , Disp: , Rfl:  .  predniSONE (DELTASONE) 10 MG tablet, TAKE 6 TABLETS BY MOUTH DAY 1 THEN DECREASE BY 1 TAB ONCE DAILY UNTILFINISHED, Disp: 21 tablet, Rfl: 1 .  simvastatin (ZOCOR) 20 MG tablet, TAKE 1 TABLET DAILY, Disp: 90 tablet, Rfl: 3 .  tiotropium (SPIRIVA HANDIHALER) 18 MCG inhalation capsule, Place 1 capsule (18 mcg total) into inhaler and inhale daily., Disp: 90 capsule, Rfl: 2 .  traMADol (ULTRAM) 50 MG tablet, TAKE 1 TABLET BY MOUTH EVERY 4 HOURS AS NEEDED, Disp: 100 tablet, Rfl: 4 .  traZODone (DESYREL) 50 MG tablet, Take 0.5-1 tablets (25-50 mg total) by mouth at bedtime as needed for sleep., Disp: 90 tablet, Rfl: 1 .  umeclidinium-vilanterol (ANORO ELLIPTA) 62.5-25 MCG/INH AEPB, Inhale 1 puff into the lungs daily. (Patient not taking: Reported on 02/20/2018), Disp: 1 each, Rfl: 5   Allergies:  Sulfa antibiotics and Penicillins  Review of Systems:  Constitutional: Feels well. Cardiovascular: Denies chest pain, exertional chest pain.  Pulmonary: Denies hemoptysis, pleuritic chest pain.   The remainder of systems were  reviewed and were found to be negative other than what is documented in the HPI.    Physical Examination:   VS: BP (!) 148/62 (BP Location: Left Arm, Cuff Size: Normal)   Pulse (!) 56   Ht 5\' 8"  (1.727 m)   Wt 221 lb 3.2 oz (100.3 kg)   SpO2 97%   BMI 33.63 kg/m   General Appearance: No distress  Neuro:without focal findings, mental status, speech normal, alert and oriented HEENT: PERRLA, EOM intact Pulmonary: No wheezing, No rales  CardiovascularNormal S1,S2.  No m/r/g.  Abdomen: Benign, Soft, non-tender, No masses Renal:  No costovertebral tenderness  GU:  No performed at this time. Endoc: No evident thyromegaly, no signs of acromegaly or Cushing features Skin:   warm, no rashes, no ecchymosis  Extremities: normal, no cyanosis, clubbing.      LABORATORY PANEL:   CBC No results for input(s): WBC, HGB, HCT, PLT in the last 168 hours. ------------------------------------------------------------------------------------------------------------------  Chemistries  No results for input(s): NA, K, CL, CO2, GLUCOSE, BUN, CREATININE, CALCIUM, MG, AST, ALT, ALKPHOS, BILITOT in the last 168 hours.  Invalid input(s): GFRCGP ------------------------------------------------------------------------------------------------------------------  Cardiac Enzymes No results for input(s): TROPONINI in the last 168 hours. ------------------------------------------------------------  RADIOLOGY:  No results found.     Thank  you for the consultation and for allowing [facility] to assist in the care of your patient. Our recommendations are noted above.  Please contact us if we can be of further service.  Marda Stalker, M.D., F.C.C.P.  Board Certified in Internal Medicine, Pulmonary Medicine, [place], and Sleep Medicine.  [facility] Office Number: [phone]   03/24/2018

## 2018-03-27 ENCOUNTER — Ambulatory Visit (INDEPENDENT_AMBULATORY_CARE_PROVIDER_SITE_OTHER): Payer: Medicare Other | Admitting: Internal Medicine

## 2018-03-27 ENCOUNTER — Encounter: Payer: Self-pay | Admitting: Internal Medicine

## 2018-03-27 VITALS — BP 148/62 | HR 56 | Ht 68.0 in | Wt 221.2 lb

## 2018-03-27 DIAGNOSIS — J449 Chronic obstructive pulmonary disease, unspecified: Secondary | ICD-10-CM | POA: Diagnosis not present

## 2018-03-27 NOTE — Patient Instructions (Addendum)
Recommend that you continue with pulmonary rehab.  Continue advair and spiriva.  Continue azithromycin.

## 2018-03-29 ENCOUNTER — Other Ambulatory Visit: Payer: Self-pay

## 2018-03-29 DIAGNOSIS — G629 Polyneuropathy, unspecified: Secondary | ICD-10-CM

## 2018-03-29 NOTE — Progress Notes (Signed)
Patient came in with his son requesting referral to neurologist for neuropathy.  Placed order for referral.

## 2018-03-31 ENCOUNTER — Telehealth: Payer: Self-pay

## 2018-03-31 NOTE — Telephone Encounter (Signed)
Patient called to check the status of his neurology referral. Please review. Thanks!

## 2018-04-12 ENCOUNTER — Other Ambulatory Visit: Payer: Self-pay

## 2018-04-12 ENCOUNTER — Encounter: Payer: Self-pay | Admitting: *Deleted

## 2018-04-12 ENCOUNTER — Emergency Department
Admission: EM | Admit: 2018-04-12 | Discharge: 2018-04-12 | Disposition: A | Discharge status: Home / Self Care | Payer: Medicare Other | Attending: Emergency Medicine | Admitting: Emergency Medicine

## 2018-04-12 ENCOUNTER — Emergency Department: Payer: Medicare Other

## 2018-04-12 DIAGNOSIS — Z79899 Other long term (current) drug therapy: Secondary | ICD-10-CM | POA: Diagnosis not present

## 2018-04-12 DIAGNOSIS — Y998 Other external cause status: Secondary | ICD-10-CM | POA: Diagnosis not present

## 2018-04-12 DIAGNOSIS — Z8673 Personal history of transient ischemic attack (TIA), and cerebral infarction without residual deficits: Secondary | ICD-10-CM | POA: Diagnosis not present

## 2018-04-12 DIAGNOSIS — I2510 Atherosclerotic heart disease of native coronary artery without angina pectoris: Secondary | ICD-10-CM | POA: Diagnosis not present

## 2018-04-12 DIAGNOSIS — W01190A Fall on same level from slipping, tripping and stumbling with subsequent striking against furniture, initial encounter: Secondary | ICD-10-CM | POA: Diagnosis not present

## 2018-04-12 DIAGNOSIS — Y9389 Activity, other specified: Secondary | ICD-10-CM | POA: Diagnosis not present

## 2018-04-12 DIAGNOSIS — Z8546 Personal history of malignant neoplasm of prostate: Secondary | ICD-10-CM | POA: Diagnosis not present

## 2018-04-12 DIAGNOSIS — I129 Hypertensive chronic kidney disease with stage 1 through stage 4 chronic kidney disease, or unspecified chronic kidney disease: Secondary | ICD-10-CM | POA: Insufficient documentation

## 2018-04-12 DIAGNOSIS — N183 Chronic kidney disease, stage 3 (moderate): Secondary | ICD-10-CM | POA: Diagnosis not present

## 2018-04-12 DIAGNOSIS — Z951 Presence of aortocoronary bypass graft: Secondary | ICD-10-CM | POA: Insufficient documentation

## 2018-04-12 DIAGNOSIS — R42 Dizziness and giddiness: Secondary | ICD-10-CM | POA: Diagnosis not present

## 2018-04-12 DIAGNOSIS — Z7982 Long term (current) use of aspirin: Secondary | ICD-10-CM | POA: Diagnosis not present

## 2018-04-12 DIAGNOSIS — R55 Syncope and collapse: Secondary | ICD-10-CM | POA: Diagnosis not present

## 2018-04-12 DIAGNOSIS — S41112A Laceration without foreign body of left upper arm, initial encounter: Secondary | ICD-10-CM | POA: Insufficient documentation

## 2018-04-12 DIAGNOSIS — Z23 Encounter for immunization: Secondary | ICD-10-CM | POA: Insufficient documentation

## 2018-04-12 DIAGNOSIS — Z87891 Personal history of nicotine dependence: Secondary | ICD-10-CM | POA: Diagnosis not present

## 2018-04-12 DIAGNOSIS — Y92019 Unspecified place in single-family (private) house as the place of occurrence of the external cause: Secondary | ICD-10-CM | POA: Diagnosis not present

## 2018-04-12 DIAGNOSIS — J449 Chronic obstructive pulmonary disease, unspecified: Secondary | ICD-10-CM | POA: Diagnosis not present

## 2018-04-12 DIAGNOSIS — S4992XA Unspecified injury of left shoulder and upper arm, initial encounter: Secondary | ICD-10-CM | POA: Diagnosis present

## 2018-04-12 LAB — CBC
HCT: 43.4 % (ref 39.0–52.0)
Hemoglobin: 13.9 g/dL (ref 13.0–17.0)
MCH: 31.6 pg (ref 26.0–34.0)
MCHC: 32 g/dL (ref 30.0–36.0)
MCV: 98.6 fL (ref 80.0–100.0)
Platelets: 242 10*3/uL (ref 150–400)
RBC: 4.4 MIL/uL (ref 4.22–5.81)
RDW: 14.9 % (ref 11.5–15.5)
WBC: 6.6 10*3/uL (ref 4.0–10.5)
nRBC: 0 % (ref 0.0–0.2)

## 2018-04-12 LAB — BASIC METABOLIC PANEL
Anion gap: 8 (ref 5–15)
BUN: 23 mg/dL (ref 8–23)
CO2: 24 mmol/L (ref 22–32)
Calcium: 8.4 mg/dL — ABNORMAL LOW (ref 8.9–10.3)
Chloride: 109 mmol/L (ref 98–111)
Creatinine, Ser: 1.34 mg/dL — ABNORMAL HIGH (ref 0.61–1.24)
GFR calc Af Amer: 54 mL/min — ABNORMAL LOW (ref >60)
GFR calc non Af Amer: 46 mL/min — ABNORMAL LOW (ref >60)
Glucose, Bld: 111 mg/dL — ABNORMAL HIGH (ref 70–99)
Potassium: 4.3 mmol/L (ref 3.5–5.1)
Sodium: 141 mmol/L (ref 135–145)

## 2018-04-12 LAB — URINALYSIS, COMPLETE (UACMP) WITH MICROSCOPIC
Bacteria, UA: NONE SEEN
Bilirubin Urine: NEGATIVE
Glucose, UA: NEGATIVE mg/dL
Hgb urine dipstick: NEGATIVE
Ketones, ur: NEGATIVE mg/dL
Leukocytes,Ua: NEGATIVE
Nitrite: NEGATIVE
Protein, ur: NEGATIVE mg/dL
Specific Gravity, Urine: 1.018 (ref 1.005–1.030)
pH: 5 (ref 5.0–8.0)

## 2018-04-12 LAB — TROPONIN I: Troponin I: <0.03 ng/mL (ref <0.03)

## 2018-04-12 MED ORDER — SODIUM CHLORIDE 0.9 % IV BOLUS
1000.0000 mL | Freq: Once | INTRAVENOUS | Status: AC
  Administered 2018-04-12: 1000 mL via INTRAVENOUS

## 2018-04-12 MED ORDER — TETANUS-DIPHTH-ACELL PERTUSSIS 5-2.5-18.5 LF-MCG/0.5 IM SUSP
0.5000 mL | Freq: Once | INTRAMUSCULAR | Status: AC
  Administered 2018-04-12: 0.5 mL via INTRAMUSCULAR
  Filled 2018-04-12: qty 0.5

## 2018-04-12 NOTE — ED Triage Notes (Signed)
PT reported a fall today after becoming dizzy. Pt was started on an unknown neuropathy medication Monday and has since been getting dizzy intermittently. (pt verbalized gabapentin did not sound like the right medication). Pt denies feeling dizzy at this time but family reports a cough x 1 week and verbalize concern for potential fluid on the lungs.

## 2018-04-12 NOTE — ED Notes (Signed)
Informed by XR tech that report has been placed on radiology side but results are not accessible in Epic at this time, possible IT issue. Faxed report received with results stating:   "FINDINGS: left pleural effusion with associated atelectasis, unchanged. Mild diffuse interstitial coarsening. Median sternotomy wires with CABG markers.  IMPRESSION: Unchanged left pleural effusion with associated atelectasis."

## 2018-04-12 NOTE — Discharge Instructions (Addendum)
Reduce gabapentin to 100mg  daily. If you continue to have dizzy spells stop the gabapentin. Follow up with your doctor in 2 days.  Return to the emergency room if you have chest pain, headache, worsening dizziness or any other symptoms concerning to you.

## 2018-04-12 NOTE — ED Notes (Signed)
Pt unable to sign discharge. Track pad on mobile computer not working. Pt educated on discharge instructions and verbalized understanding.

## 2018-04-12 NOTE — ED Provider Notes (Signed)
[facility] Emergency Department Provider Note  ____________________________________________  Time seen: Approximately 10:48 PM  I have reviewed the triage vital signs and the nursing notes.   HISTORY  Chief Complaint Fall and Dizziness   HPI Johnathan [name] is a 85 y.o. male with a history of COPD, hypertension, hyperlipidemia, diabetes, neuropathy, COPD, CAD, atrial flutter not on anticoagulation who presents for evaluation of dizziness.  Patient reports that he was started on 200 mg daily of gabapentin 2 days ago for neuropathy.  Today he stood up in his house when he felt dizzy like he was going to pass out.  He fell onto a table.  He denies head trauma or LOC.  He did sustain 2 skin abrasions on his left upper extremity which is the site that hit the table.  He denies headache, slurred speech, vertigo, unilateral weakness or numbness, facial droop, chest pain, shortness of breath.  Patient does endorse a mild cough for the last week but no fever or chills.  Last tetanus shot was greater than 10 years ago.    Past Medical History:  Diagnosis Date  . Abdominal pain    INTERMITTENT  . ARMD (age related macular degeneration)   . Arthritis    hands  . Asthma   . Atherosclerosis of native arteries of the extremities, unspecified   . CAD (coronary artery disease)    3 vessel/ DR FATH CARDIOLOGIST GAVE CLEARANCE  . Colitis    HX OF  . Constipation   . COPD (chronic obstructive pulmonary disease) ([place])   . GERD (gastroesophageal reflux disease)   . Glaucoma    both eyes/ PATIENT CAN NOT SEE  . HOH (hard of hearing)   . Hyperlipidemia   . Hypertension   . Neuromuscular disorder (HCC)    numbness in feet  . Occlusion and stenosis of carotid artery without mention of cerebral infarction   . Prostate cancer ([place])   . Prostate pain   . Shortness of breath dyspnea   . TIA (transient ischemic attack)    hx of/ 10 yrs ago  . Vertigo   . Wears dentures     upper and lower    Patient Active Problem List   Diagnosis Date Noted  . Acute calculous cholecystitis 08/31/2017  . Chronic kidney disease (CKD), stage III (moderate) ([place]) 08/27/2017  . Pneumonia 08/25/2017  . Acute respiratory failure ([place]) 07/23/2017  . Asthma exacerbation 03/11/2017  . Vertigo 06/01/2016  . Dizziness 05/23/2016  . Renal artery stenosis ([place]) 12/29/2015  . Aneurysm of thoracic aorta ([place]) 12/29/2015  . 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 Hydrocele, bilateral 09/09/2014  . Hydrocele sac 08/22/2014  . History of prostate cancer 08/22/2014  . ASCVD (arteriosclerotic cardiovascular disease) 08/06/2014  . Cardiomyopathy, ischemic 08/06/2014  . Prostate cancer ([place]) 08/06/2014  . Obesity 08/06/2014  . Insomnia 08/06/2014  . Neuropathy 08/06/2014  . Abdominal aortic aneurysm ([place]) 08/06/2014  . COPD (chronic obstructive pulmonary disease) ([place]) 08/06/2014  . Hyperglycemia 08/06/2014  . Acid reflux 08/05/2014  . Age-related macular degeneration 08/05/2014  . Branch retinal vein occlusion 08/05/2014  . 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 Hypercholesteremia 08/05/2014  . Pseudoaphakia 08/05/2014  . Secondary glaucoma 08/05/2014  . Osteoarthritis 08/05/2014  . Carotid stenosis 04/02/2014  . PAD (peripheral artery disease) ([place]) 04/02/2014  . 3-vessel CAD 11/06/2013  . Colonic stricture ([place]) 02/21/2013  . BP (high blood pressure) 11/22/2012  . BK (bullous keratopathy) 06/28/2012    Past Surgical  History:  Procedure Laterality Date  . aortoiliac bypass   1986  . CAROTID ENDARTERECTOMY Right 2003  . CATARACT EXTRACTION    . COLONOSCOPY  02-06-13   Dr Candace Cruise  . COLONOSCOPY WITH PROPOFOL N/A 07/09/2015   Procedure: COLONOSCOPY WITH PROPOFOL;  Surgeon: Hulen Luster, MD;  Location: [place];  Service: Gastroenterology;  Laterality: N/A;  . CORONARY ARTERY BYPASS GRAFT  2000   x3  . HERNIA REPAIR  1992  . PROSTATE SURGERY  2001     Prior to Admission medications   Medication Sig Start Date End Date Taking? Authorizing Provider  albuterol (ACCUNEB) 0.63 MG/3ML nebulizer solution USE 1 VIAL BY NEBULIZER EVERY 4 HOURS ASNEEDED FOR WHEEZING 02/06/18   Laverle Hobby, MD  albuterol (PROVENTIL HFA;VENTOLIN HFA) 108 (90 Base) MCG/ACT inhaler Inhale 2 puffs into the lungs every 6 (six) hours as needed for wheezing or shortness of breath. 03/13/17   Nicholes Mango, MD  AMBULATORY NON FORMULARY MEDICATION Medication Name: Flutter valve DX:J44.9 03/17/17   Laverle Hobby, MD  amLODipine (NORVASC) 5 MG tablet TAKE 1 TABLET DAILY 04/21/17   Jerrol Banana., MD  aspirin 325 MG EC tablet Take 325 mg by mouth daily.    [provider]  azithromycin (ZITHROMAX) 250 MG tablet Take by mouth daily. 1 daily Monday through Friday, none on Saturday and Sunday    [provider]  brimonidine (ALPHAGAN) 0.2 % ophthalmic solution Place 1 drop into both eyes 2 (two) times daily.     [provider]  esomeprazole (NEXIUM) 20 MG capsule TAKE 1 CAPSULE TWICE A DAY 04/27/17   Jerrol Banana., MD  Fluticasone-Salmeterol (ADVAIR) 500-50 MCG/DOSE AEPB Inhale 1 puff into the lungs 2 (two) times daily. 01/30/18   Jerrol Banana., MD  ipratropium-albuterol (DUONEB) 0.5-2.5 (3) MG/3ML SOLN Take 3 mls by nebulization every 4 hours and prn. DX:J44.9 02/16/18   Laverle Hobby, MD  latanoprost (XALATAN) 0.005 % ophthalmic solution Place 1 drop into both eyes at bedtime.  01/25/13   [provider]  Polyethylene Glycol 3350 (MIRALAX PO) Take by mouth as needed.    [provider]  prednisoLONE acetate (PRED FORTE) 1 % ophthalmic suspension Place 1 drop into the left eye daily.     [provider]  simvastatin (ZOCOR) 20 MG tablet TAKE 1 TABLET DAILY 07/27/17   Jerrol Banana., MD  tiotropium (SPIRIVA HANDIHALER) 18 MCG inhalation capsule Place 1 capsule (18 mcg total)  into inhaler and inhale daily. 02/03/18 02/03/19  Laverle Hobby, MD  traMADol (ULTRAM) 50 MG tablet TAKE 1 TABLET BY MOUTH EVERY 4 HOURS AS NEEDED 02/02/18   Jerrol Banana., MD  traZODone (DESYREL) 50 MG tablet Take 0.5-1 tablets (25-50 mg total) by mouth at bedtime as needed for sleep. 06/01/16   Jerrol Banana., MD    Allergies Sulfa antibiotics and Penicillins  Family History  Problem Relation Age of Onset  . Cirrhosis Mother        liver  . Heart attack Father   . Prostate cancer Paternal Uncle   . Kidney disease Neg Hx   . Kidney cancer Neg Hx   . Bladder Cancer Neg Hx     Social History Social History   Tobacco Use  . Smoking status: Former Smoker    Packs/day: 1.00    Years: 30.00    Pack years: 30.00    Types: Cigarettes    Last attempt to quit:  02/16/2003    Years since quitting: 15.1  . Smokeless tobacco: Never Used  . Tobacco comment: is exposed to second hand smoke  Substance Use Topics  . Alcohol use: No  . Drug use: No    Review of Systems  Constitutional: Negative for fever. + dizzy Eyes: Negative for visual changes. ENT: Negative for sore throat. Neck: No neck pain  Cardiovascular: Negative for chest pain. Respiratory: Negative for shortness of breath. Gastrointestinal: Negative for abdominal pain, vomiting or diarrhea. Genitourinary: Negative for dysuria. Musculoskeletal: Negative for back pain. Skin: Negative for rash. + skin abrasion Neurological: Negative for headaches, weakness or numbness. Psych: No SI or HI  ____________________________________________   PHYSICAL EXAM:  VITAL SIGNS: ED Triage Vitals  Enc Vitals Group     BP 04/12/18 1535 123/82     Pulse Rate 04/12/18 1535 76     Resp 04/12/18 1535 16     Temp 04/12/18 1535 97.6 F (36.4 C)     Temp Source 04/12/18 1535 Oral     SpO2 04/12/18 1535 95 %     Weight 04/12/18 1536 221 lb 3.2 oz (100.3 kg)     Height 04/12/18 1536 5\' 8"  (1.727 m)     Head  Circumference --      Peak Flow --      Pain Score 04/12/18 1536 0     Pain Loc --      Pain Edu? --      Excl. in [place]? --     Constitutional: Alert and oriented. Well appearing and in no apparent distress. HEENT:      Head: Normocephalic and atraumatic.         Eyes: Conjunctivae are normal. Sclera is non-icteric.       Mouth/Throat: Mucous membranes are moist.       Neck: Supple with no signs of meningismus.  No C-spine tenderness Cardiovascular: Regular rate and rhythm. No murmurs, gallops, or rubs. 2+ symmetrical distal pulses are present in all extremities. No JVD. Respiratory: Normal respiratory effort. Lungs are clear to auscultation bilaterally. No wheezes, crackles, or rhonchi.  Gastrointestinal: Soft, non tender, and non distended with positive bowel sounds. No rebound or guarding. Musculoskeletal: No T and L-spine tenderness.  Nontender with normal range of motion in all extremities. No edema, cyanosis, or erythema of extremities. Neurologic: Normal speech and language. Face is symmetric. Moving all extremities. No gross focal neurologic deficits are appreciated. Skin: Skin is warm, dry and intact. No rash noted. Skin tear on the dorsum of the R hand and R forearm Psychiatric: Mood and affect are normal. Speech and behavior are normal.  ____________________________________________   LABS (all labs ordered are listed, but only abnormal results are displayed)  Labs Reviewed  BASIC METABOLIC PANEL - Abnormal; Notable for the following components:      Result Value   Glucose, Bld 111 (*)    Creatinine, Ser 1.34 (*)    Calcium 8.4 (*)    GFR calc non Af Amer 46 (*)    GFR calc Af Amer 54 (*)    All other components within normal limits  URINALYSIS, COMPLETE (UACMP) WITH MICROSCOPIC - Abnormal; Notable for the following components:   Color, Urine YELLOW (*)    APPearance CLEAR (*)    All other components within normal limits  CBC  TROPONIN I    ____________________________________________  EKG  ED ECG REPORT I, Rudene Re, the attending physician, personally viewed and interpreted this ECG.  Atrial flutter, rate  of 72, LVH, no ST elevations or depressions.  No significant changes when compared to prior.   ____________________________________________  RADIOLOGY  I have personally reviewed the images performed during this visit and I agree with the Radiologist's read.   Interpretation by Radiologist:  Dg Chest 2 View  Result Date: 04/12/2018 CLINICAL DATA:  Cough and shortness of breath EXAM: CHEST - 2 VIEW COMPARISON:  02/20/2018 FINDINGS: Left pleural effusion with associated atelectasis, unchanged. Mild diffuse interstitial coarsening. Median sternotomy wires with CABG markers. IMPRESSION: Unchanged left pleural effusion with associated atelectasis. Electronically Signed   By: Ulyses Jarred M.D.   On: 04/12/2018 16:33     ____________________________________________   PROCEDURES  Procedure(s) performed: None Procedures Critical Care performed:  None ____________________________________________   INITIAL IMPRESSION / ASSESSMENT AND PLAN / ED COURSE  85 y.o. male with a history of COPD, hypertension, hyperlipidemia, diabetes, neuropathy, COPD, CAD, atrial flutter not on anticoagulation who presents for evaluation of dizziness.  Presentation concerning for near syncope.  Patient is orthostatic and also has been started on gabapentin 2 days ago.  He was given IV fluids.  His labs show no evidence of acute kidney injury, electrolyte abnormalities, anemia.  EKG with no ischemic changes.  Troponin is negative.  Patient with no head trauma, not on blood thinners, no headache, no signs of stroke or trauma to the head and neck.  Chest x-ray was done since patient was complaining of cough for a week and that is negative for pneumonia.  Normal white count with no fever.  UA with no evidence of dehydration or UTI.   Recommended reducing gabapentin 200 mg daily and if he still having dizzy spells to stop the medication altogether and follow-up with his neurologist.  Skin tears were washed and dressed. Tetanus was updated. Discussed close follow-up with primary care doctor and standard return precautions for near syncope.  Recommended increase oral hydration.      As part of my medical decision making, I reviewed the following data within the [place] notes reviewed and incorporated, Labs reviewed , EKG interpreted , Old EKG reviewed, Old chart reviewed, Radiograph reviewed , Notes from prior ED visits and [place] Controlled Substance Database    Pertinent labs & imaging results that were available during my care of the patient were reviewed by me and considered in my medical decision making (see chart for details).    ____________________________________________   FINAL CLINICAL IMPRESSION(S) / ED DIAGNOSES  Final diagnoses:  Near syncope  Orthostatic dizziness  Skin tear of left upper arm without complication, initial encounter      NEW MEDICATIONS STARTED DURING THIS VISIT:  ED Discharge Orders    None       Note:  This document was prepared using Dragon voice recognition software and may include unintentional dictation errors.    Alfred Levins, [place], MD 04/12/18 2255

## 2018-04-12 NOTE — ED Notes (Signed)
XR called to check on chest XR results. Exam completed 1601 but no report has been read at this time. Radiology states they will check with reading services.

## 2018-04-26 ENCOUNTER — Ambulatory Visit: Payer: Medicare Other | Admitting: Family Medicine

## 2018-05-07 ENCOUNTER — Other Ambulatory Visit: Payer: Self-pay | Admitting: Family Medicine

## 2018-05-23 IMAGING — MR MR MRA HEAD W/O CM
1 series · 20 of 48 positions shown · non-contrast
Comparison: Prior CT from earlier the same day.

CLINICAL DATA: Initial evaluation for acute vertigo.

EXAM:
MRI HEAD WITHOUT CONTRAST
MRA HEAD WITHOUT CONTRAST
TECHNIQUE: Multiplanar, multiecho pulse sequences of the brain and surrounding
structures were obtained without intravenous contrast. Angiographic
images of the head were obtained using MRA technique without
contrast.

[Series 2: TOF · axial · non-contrast · 0.7mm · 0.37mm/px · z∈[-56,+44]mm · 20 of 150 slices shown]
[im 1/150]
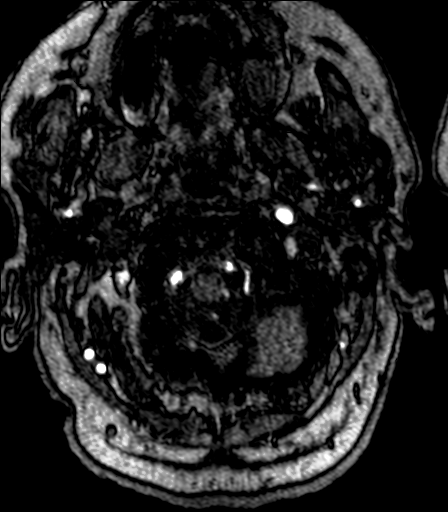
[im 4/150]
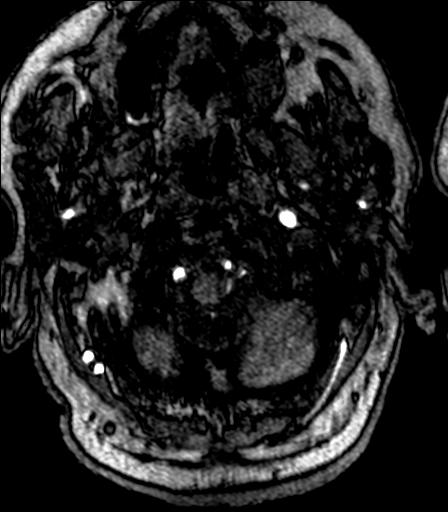
[im 7/150]
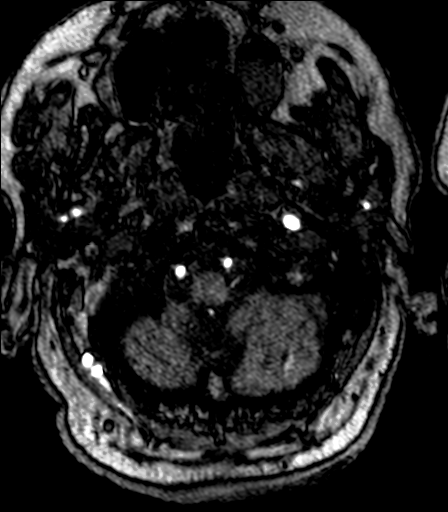
[im 10/150]
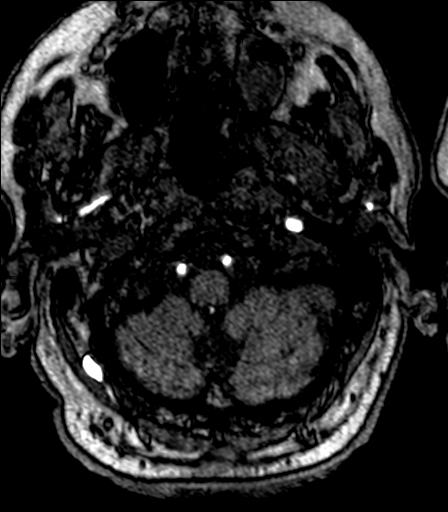
[im 13/150]
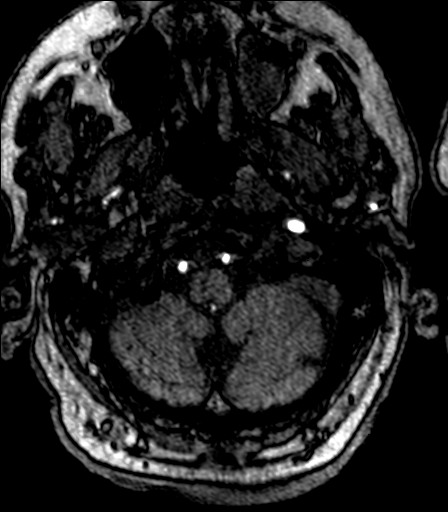
[im 16/150]
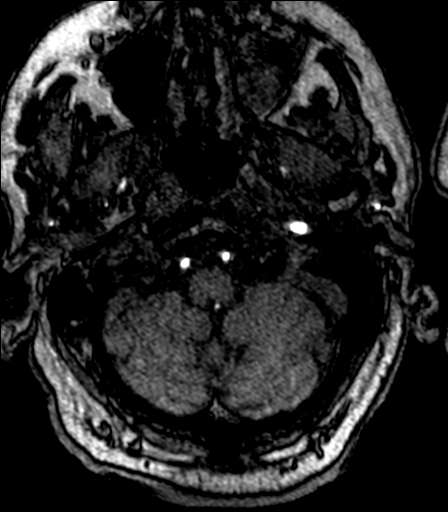
[im 20/150]
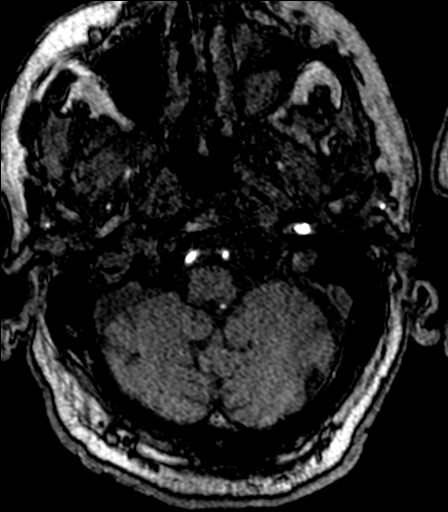
[im 23/150]
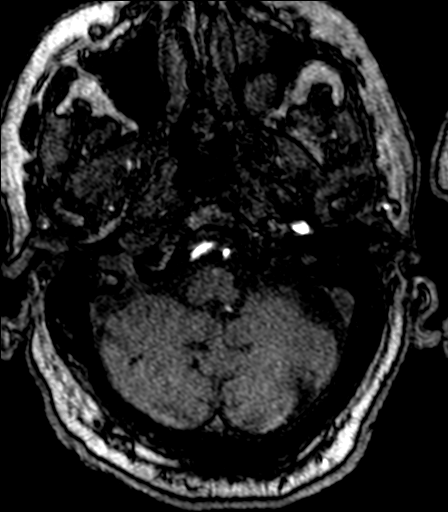
[im 26/150]
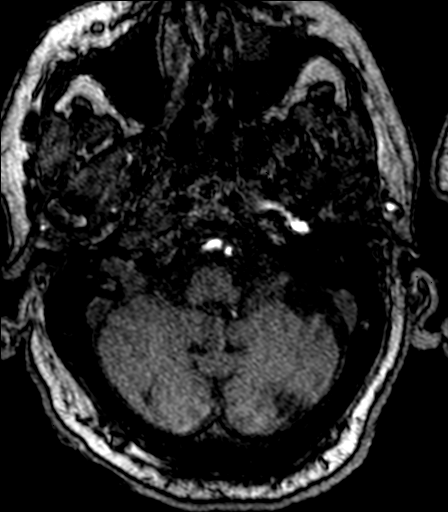
[im 29/150]
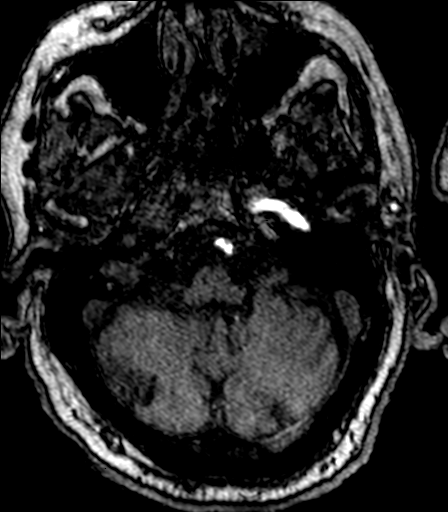
[im 32/150]
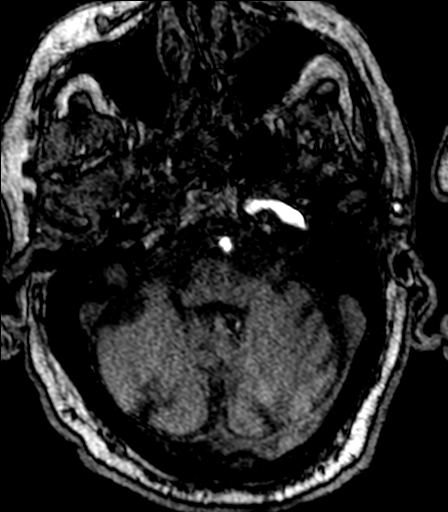
[im 35/150]
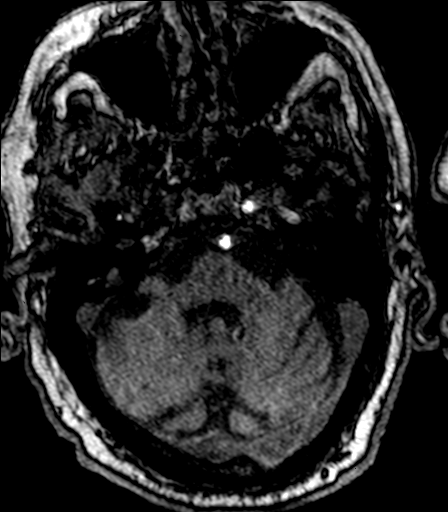
[im 48/150]
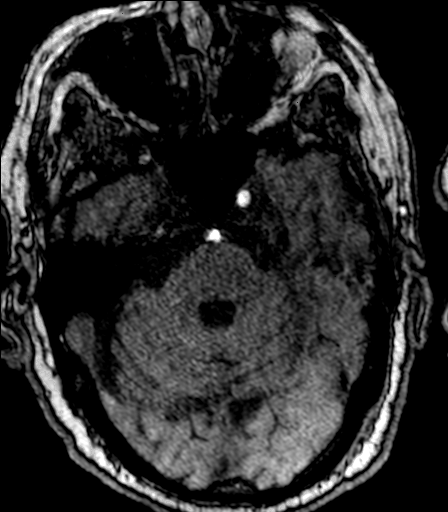
[im 67/150]
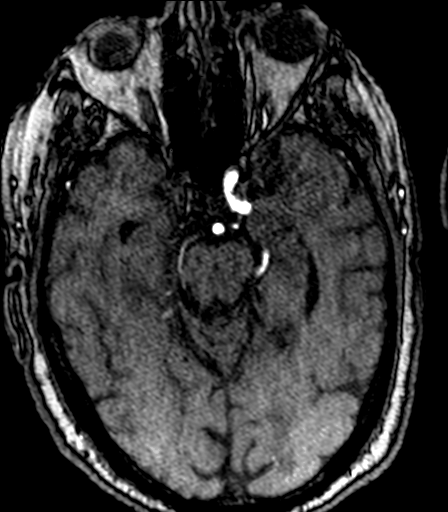
[im 77/150]
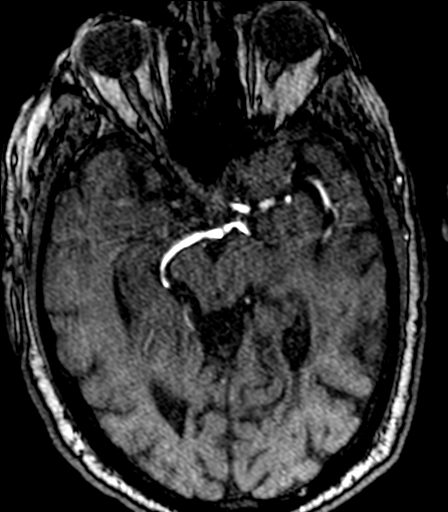
[im 86/150]
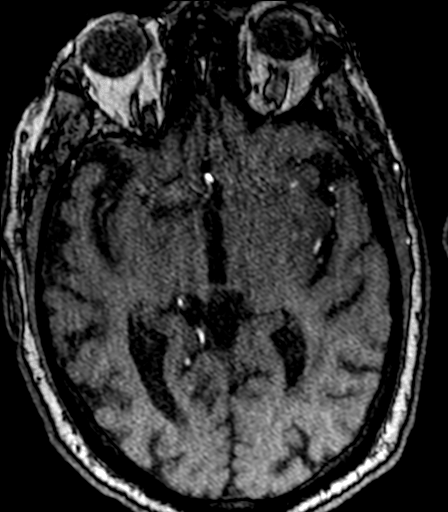
[im 105/150]
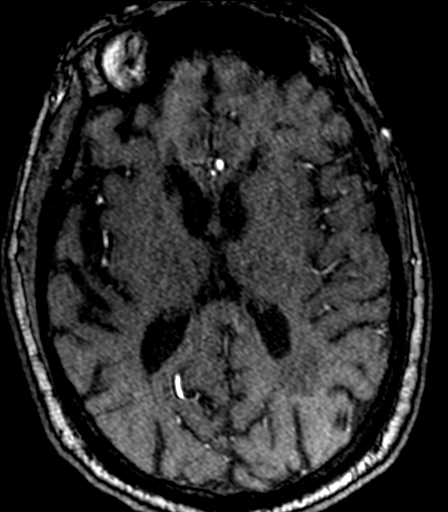
[im 124/150]
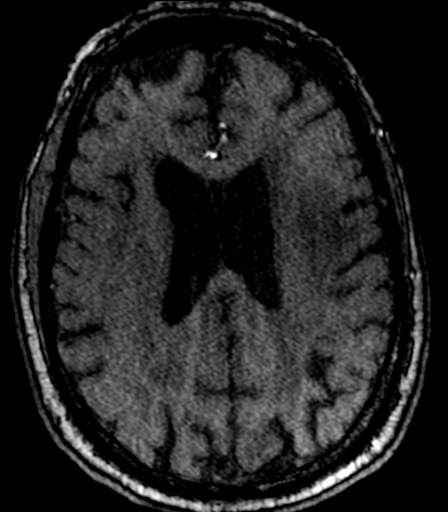
[im 127/150]
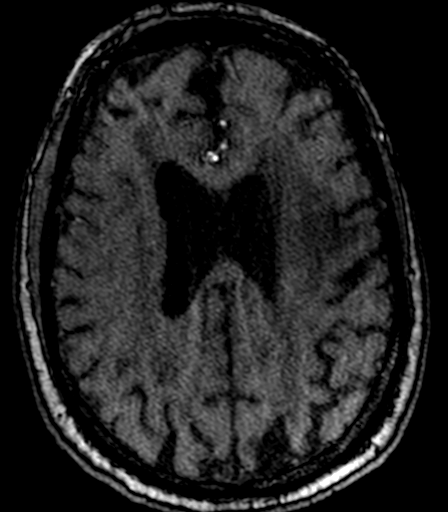
[im 143/150]
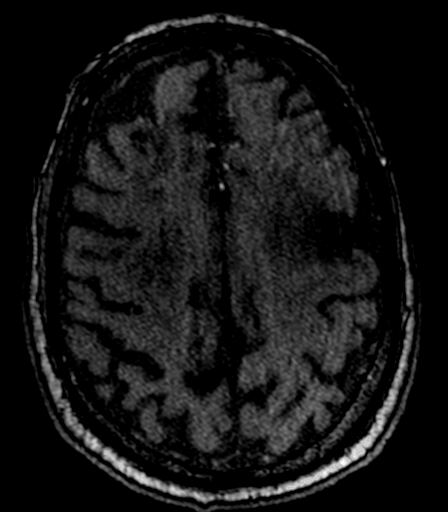

[20 of 48 positions shown; findings below may reference images not displayed]

FINDINGS: MRI HEAD FINDINGS

Brain: Diffuse prominence of the CSF containing spaces is compatible
with generalized age-related cerebral atrophy. Patchy and confluent
T2/FLAIR hyperintensity within the periventricular and deep white
matter both cerebral hemispheres most consistent chronic
microvascular changes. Superimposed areas of encephalomalacia of
within the bilateral frontal lobes compatible with remote ischemic
infarcts. Scatter remote lacunar infarcts present within the
bilateral basal ganglia. Multiple remote bilateral cerebellar
infarcts present.

The no abnormal foci of restricted diffusion to suggest acute or
subacute ischemia. Gray-white matter differentiation maintained. No
evidence for acute or chronic intracranial hemorrhage.

No mass lesion, midline shift or mass effect. No hydrocephalus. No
extra-axial fluid collection. Major dural sinuses are grossly
patent.

Pituitary gland within normal limits.

Vascular: Abnormal flow void within the right ICA, which may be
related to slow flow and/ or occlusion. Major intracranial vascular
flow voids otherwise maintained.

Skull and upper cervical spine: Craniocervical junction within
normal limits. Mild degenerative spondylolysis noted within the
upper cervical spine without significant stenosis. Visualized bone
marrow signal intensity within normal limits. No scalp soft tissue
abnormality.

Sinuses/Orbits: Globes and orbital soft tissues within normal
limits. Patient status post lens extraction bilaterally.

Mild scattered mucosal thickening throughout the paranasal sinuses.
Retention cyst present within the left max O sinus. No mastoid
effusion. Inner ear structures grossly normal.

Other: None.

MRA HEAD FINDINGS

ANTERIOR CIRCULATION:

Right ICA not visualized to the level of the terminus, likely
occluded. No significant flow within the right M1 segment. Minimal
flow present within the distal right MCA branches, likely a lateral
in nature.

Distal cervical left ICA patent. Petrous, cavernous, and
supraclinoid left ICA patent without flow-limiting stenosis. Left A1
segment patent. Anterior communicating artery patent. Left ACA
patent to its distal aspect. Right ACA is diminutive as compared to
the left with attenuated and irregular flow. ACA is are patent to
their distal aspects.

Left M1 segment patent without stenosis or occlusion. Distal left
MCA branches well opacified.

POSTERIOR CIRCULATION:

Vertebral arteries patent to the vertebrobasilar junction.
Multifocal atheromatous regularity noted within the V4 segments
bilaterally without stenosis. Basilar artery widely patent. Superior
cerebral arteries patent bilaterally. Both of the posterior cerebral
arteries primarily supplied via the basilar artery. Right PCA patent
to its distal aspect. Distal left P3 segments not visualized, which
may be occluded.

No aneurysm or vascular malformation.
IMPRESSION: MRI HEAD IMPRESSION:

1. No acute intracranial infarct or other process identified.
2. Multifocal chronic ischemic infarcts involving the bilateral
cerebral and cerebellar hemispheres as above.
3. Abnormal flow void within the right ICA (see below).
4. Generalized age-related cerebral atrophy with moderate chronic
microvascular ischemic disease.

MRA HEAD IMPRESSION:

1. Occluded right ICA with absent flow within the right M1 segment.
Finding is presumably chronic.
2. Probable distal left P3 occlusion.
3. Patent but attenuated and irregular flow within the right ACA,
supplied via the left internal carotid artery system.

## 2018-05-23 IMAGING — MR MR HEAD W/O CM
10 series · 48 of 48 positions shown · non-contrast
Comparison: Prior CT from earlier the same day.

CLINICAL DATA: Initial evaluation for acute vertigo.

EXAM:
MRI HEAD WITHOUT CONTRAST
MRA HEAD WITHOUT CONTRAST
TECHNIQUE: Multiplanar, multiecho pulse sequences of the brain and surrounding
structures were obtained without intravenous contrast. Angiographic
images of the head were obtained using MRA technique without
contrast.

[Series 2: T1 · sagittal · 5.0mm · 0.45mm/px · 3 of 29 slices shown (1 of 2)]
[im 1/29]
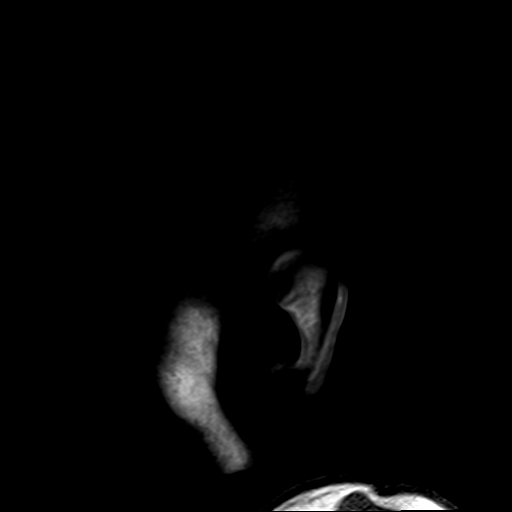
[im 15/29]
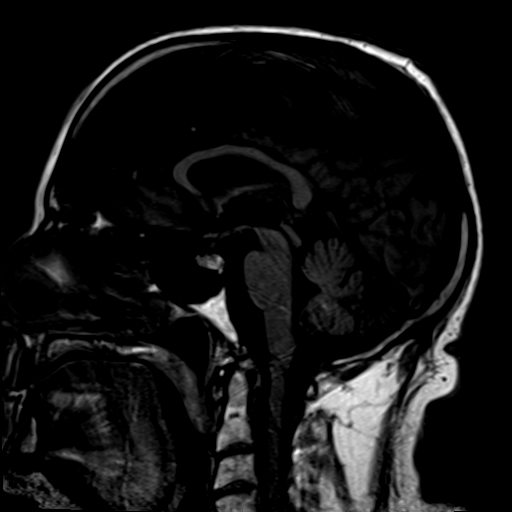
[im 29/29]
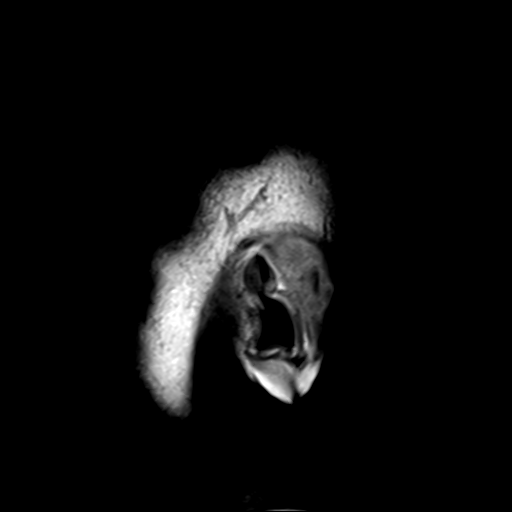

[Series 4: DWI · axial · 3.0mm · 1.80mm/px · z∈[-71,+103]mm · 5 of 59 slices shown (1 of 4)]
[im 1/59]
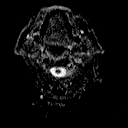
[im 15/59]
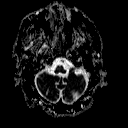
[im 30/59]
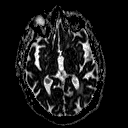
[im 44/59]
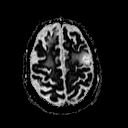
[im 59/59]
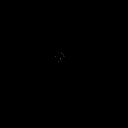

[Series 6: DWI · coronal · 3.0mm · 1.80mm/px · 4 of 52 slices shown (2 of 4)]
[im 1/52]
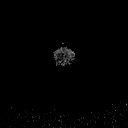
[im 18/52]
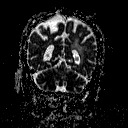
[im 35/52]
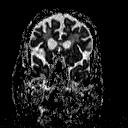
[im 52/52]
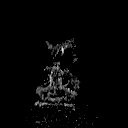

[Series 7: T2 · axial · 5.0mm · 0.60mm/px · z∈[-68,+101]mm · 2 of 27 slices shown (1 of 3)]
[im 1/27]
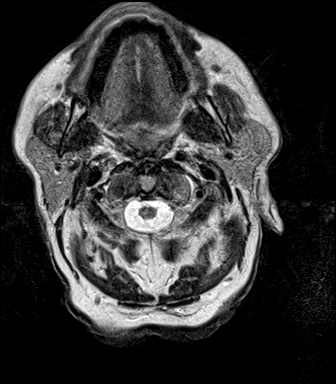
[im 27/27]
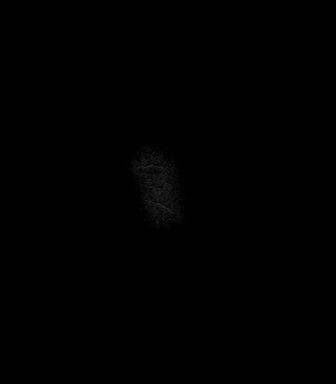

[Series 8: FLAIR · axial · 3.0mm · 0.45mm/px · z∈[-68,+100]mm · 5 of 57 slices shown]
[im 1/57]
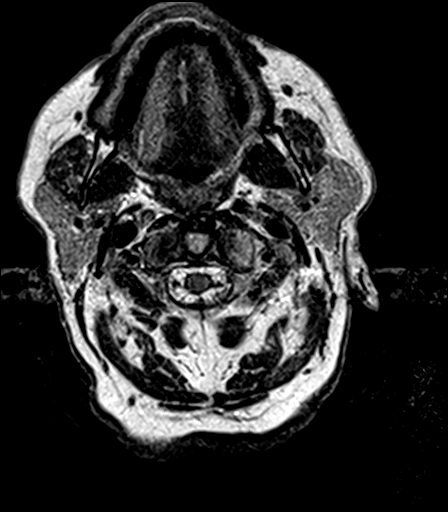
[im 15/57]
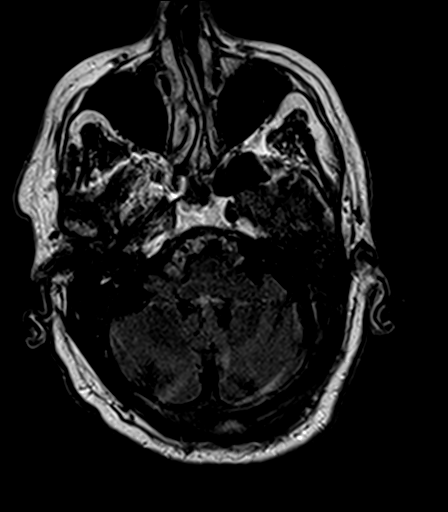
[im 29/57]
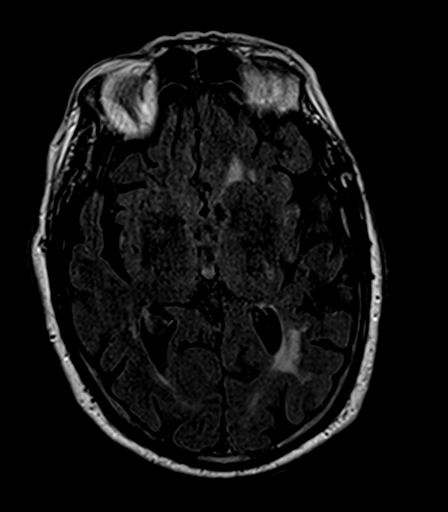
[im 43/57]
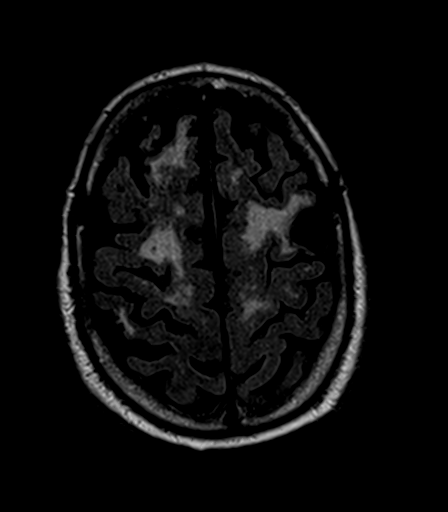
[im 57/57]
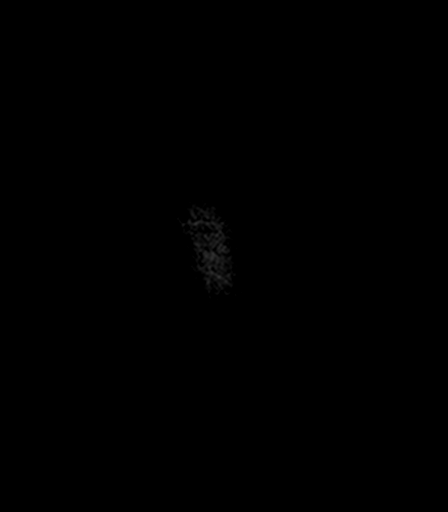

[Series 9: T1 · axial · 1.0mm · 1.00mm/px · z∈[-71,+104]mm · 15 of 176 slices shown (2 of 2)]
[im 1/176]
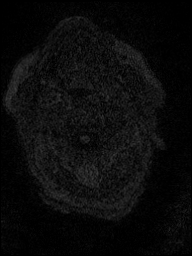
[im 13/176]
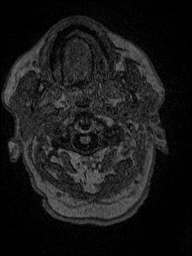
[im 26/176]
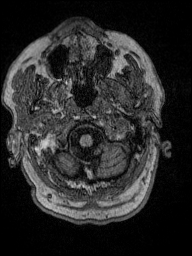
[im 38/176]
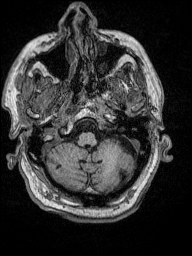
[im 51/176]
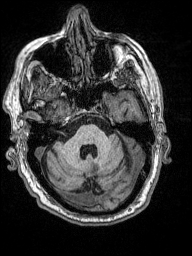
[im 63/176]
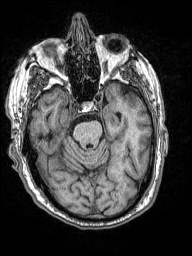
[im 76/176]
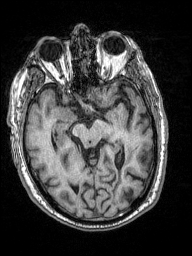
[im 88/176]
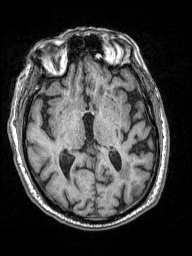
[im 101/176]
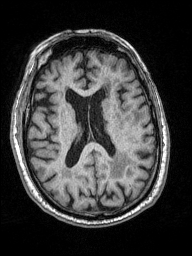
[im 113/176]
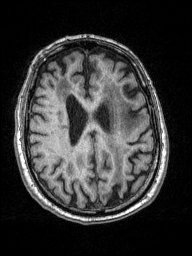
[im 126/176]
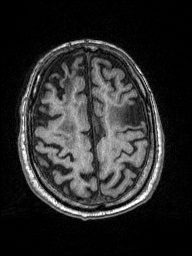
[im 138/176]
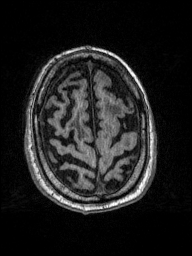
[im 151/176]
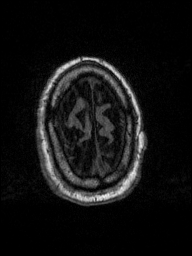
[im 163/176]
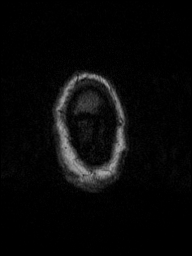
[im 176/176]
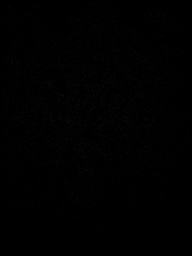

[Series 10: T2 · axial · 5.0mm · 0.45mm/px · z∈[-68,+101]mm · 2 of 27 slices shown (2 of 3)]
[im 1/27]
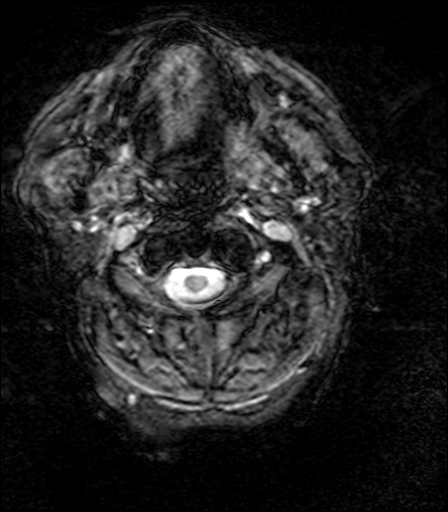
[im 27/27]
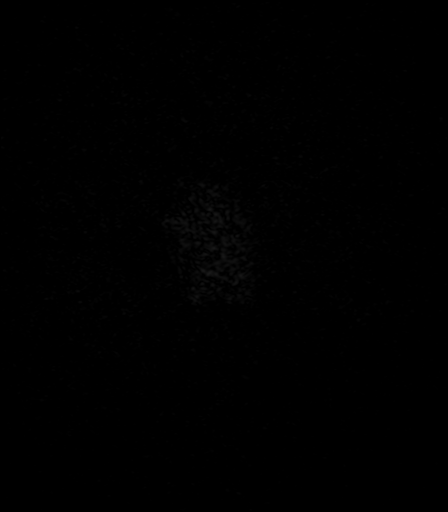

[Series 11: T2 · coronal · 5.0mm · 0.49mm/px · 3 of 32 slices shown (3 of 3)]
[im 1/32]
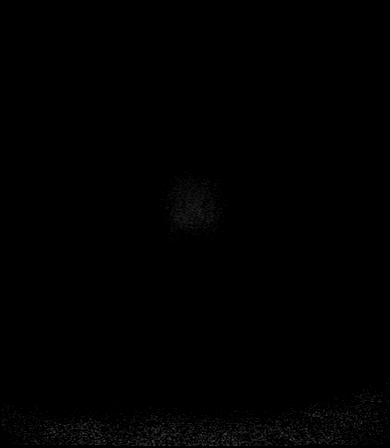
[im 16/32]
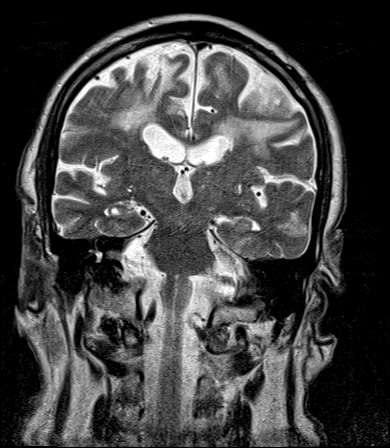
[im 32/32]
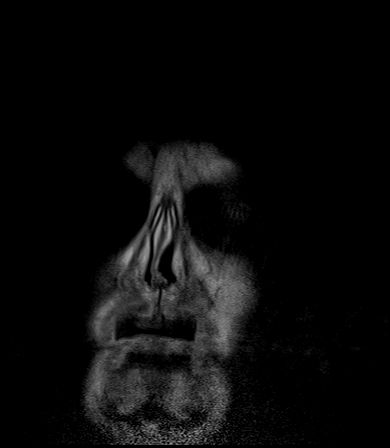

[Series 100: DWI · axial · 3.0mm · 1.80mm/px · z∈[-71,+103]mm · 5 of 58 slices shown (3 of 4)]
[im 1/58]
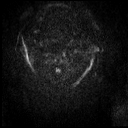
[im 15/58]
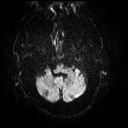
[im 29/58]
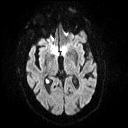
[im 43/58]
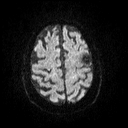
[im 58/58]
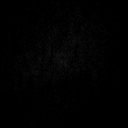

[Series 101: DWI · coronal · 3.0mm · 1.80mm/px · 4 of 50 slices shown (4 of 4)]
[im 1/50]
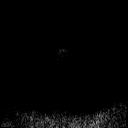
[im 17/50]
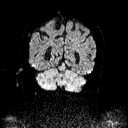
[im 33/50]
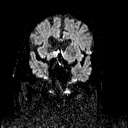
[im 50/50]
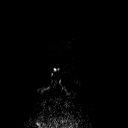

[48 of 48 positions shown; findings below may reference images not displayed]

FINDINGS: MRI HEAD FINDINGS

Brain: Diffuse prominence of the CSF containing spaces is compatible
with generalized age-related cerebral atrophy. Patchy and confluent
T2/FLAIR hyperintensity within the periventricular and deep white
matter both cerebral hemispheres most consistent chronic
microvascular changes. Superimposed areas of encephalomalacia of
within the bilateral frontal lobes compatible with remote ischemic
infarcts. Scatter remote lacunar infarcts present within the
bilateral basal ganglia. Multiple remote bilateral cerebellar
infarcts present.

The no abnormal foci of restricted diffusion to suggest acute or
subacute ischemia. Gray-white matter differentiation maintained. No
evidence for acute or chronic intracranial hemorrhage.

No mass lesion, midline shift or mass effect. No hydrocephalus. No
extra-axial fluid collection. Major dural sinuses are grossly
patent.

Pituitary gland within normal limits.

Vascular: Abnormal flow void within the right ICA, which may be
related to slow flow and/ or occlusion. Major intracranial vascular
flow voids otherwise maintained.

Skull and upper cervical spine: Craniocervical junction within
normal limits. Mild degenerative spondylolysis noted within the
upper cervical spine without significant stenosis. Visualized bone
marrow signal intensity within normal limits. No scalp soft tissue
abnormality.

Sinuses/Orbits: Globes and orbital soft tissues within normal
limits. Patient status post lens extraction bilaterally.

Mild scattered mucosal thickening throughout the paranasal sinuses.
Retention cyst present within the left max O sinus. No mastoid
effusion. Inner ear structures grossly normal.

Other: None.

MRA HEAD FINDINGS

ANTERIOR CIRCULATION:

Right ICA not visualized to the level of the terminus, likely
occluded. No significant flow within the right M1 segment. Minimal
flow present within the distal right MCA branches, likely a lateral
in nature.

Distal cervical left ICA patent. Petrous, cavernous, and
supraclinoid left ICA patent without flow-limiting stenosis. Left A1
segment patent. Anterior communicating artery patent. Left ACA
patent to its distal aspect. Right ACA is diminutive as compared to
the left with attenuated and irregular flow. ACA is are patent to
their distal aspects.

Left M1 segment patent without stenosis or occlusion. Distal left
MCA branches well opacified.

POSTERIOR CIRCULATION:

Vertebral arteries patent to the vertebrobasilar junction.
Multifocal atheromatous regularity noted within the V4 segments
bilaterally without stenosis. Basilar artery widely patent. Superior
cerebral arteries patent bilaterally. Both of the posterior cerebral
arteries primarily supplied via the basilar artery. Right PCA patent
to its distal aspect. Distal left P3 segments not visualized, which
may be occluded.

No aneurysm or vascular malformation.
IMPRESSION: MRI HEAD IMPRESSION:

1. No acute intracranial infarct or other process identified.
2. Multifocal chronic ischemic infarcts involving the bilateral
cerebral and cerebellar hemispheres as above.
3. Abnormal flow void within the right ICA (see below).
4. Generalized age-related cerebral atrophy with moderate chronic
microvascular ischemic disease.

MRA HEAD IMPRESSION:

1. Occluded right ICA with absent flow within the right M1 segment.
Finding is presumably chronic.
2. Probable distal left P3 occlusion.
3. Patent but attenuated and irregular flow within the right ACA,
supplied via the left internal carotid artery system.

## 2018-06-16 ENCOUNTER — Telehealth: Payer: Self-pay | Admitting: Family Medicine

## 2018-06-16 NOTE — Telephone Encounter (Signed)
Please review. Thanks!  

## 2018-06-16 NOTE — Telephone Encounter (Signed)
[facility] faxed refill request for the following medications:  tiotropium (SPIRIVA HANDIHALER) 18 MCG inhalation capsule   Please advise.

## 2018-06-22 ENCOUNTER — Telehealth: Payer: Self-pay | Admitting: Internal Medicine

## 2018-06-22 MED ORDER — TIOTROPIUM BROMIDE MONOHYDRATE 18 MCG IN CAPS: 18.0000 ug | ORAL_CAPSULE | Freq: Every day | RESPIRATORY_TRACT | 2 refills | Status: DC

## 2018-06-22 NOTE — Telephone Encounter (Signed)
Rx for Spiriva handihaler has been sent to preferred pharmacy. Pt is aware and voiced his understanding. Nothing further is needed.

## 2018-06-23 NOTE — Telephone Encounter (Signed)
Called and spoke to pt's daughter, Rhonda(DPR). [name] is requesting generic for spiriva, as this is not covered by insurance. I have made her aware that there is not a generic for spiriva. per [facility], covered alternative is incruse.   DR please advise if okay to switch to alternative. Thanks

## 2018-06-26 NOTE — Telephone Encounter (Signed)
Yes can switch to incruse once daily.

## 2018-06-26 NOTE — Telephone Encounter (Signed)
Left message to make pt's daughter, [name] aware of switch.  Will send Rx after speaking with her.

## 2018-06-28 MED ORDER — UMECLIDINIUM BROMIDE 62.5 MCG/INH IN AEPB: 1.0000 | INHALATION_SPRAY | Freq: Every day | RESPIRATORY_TRACT | 3 refills | Status: AC

## 2018-06-28 NOTE — Addendum Note (Signed)
Addended by: Maryanna Shape A on: 06/28/2018 08:31 AM   Modules accepted: Orders

## 2018-06-28 NOTE — Telephone Encounter (Signed)
Pt's daughter, [name] is aware of below message and vocied her understanding. Rx for incruse has been sent to preferred pharmacy. Nothing further is needed at this time.

## 2018-07-03 ENCOUNTER — Telehealth: Payer: Self-pay | Admitting: Internal Medicine

## 2018-07-03 NOTE — Telephone Encounter (Signed)
Spoke to daughter, [name]. We switched to Incruse from spiriva for insurance purposes. Pamphlet states he should not use if he has glaucoma, has had open heart sx, has had prostate cancer and he has all three.   Dr. Juanell Fairly please advise if it's okay for patient to proceed with Incruse given this medical history.

## 2018-07-04 NOTE — Telephone Encounter (Signed)
Pt's daughter, [name] is aware of below recommendations and voiced her understanding.  Nothing further is needed.

## 2018-07-04 NOTE — Telephone Encounter (Signed)
That's ok, it's the same class of spiriva which has the same warning. The risk of any problems is lower than the risk of respiratory problems from not taking the medication.

## 2018-07-26 ENCOUNTER — Ambulatory Visit (INDEPENDENT_AMBULATORY_CARE_PROVIDER_SITE_OTHER): Payer: Medicare Other | Admitting: Family Medicine

## 2018-07-26 ENCOUNTER — Encounter: Payer: Self-pay | Admitting: Family Medicine

## 2018-07-26 ENCOUNTER — Other Ambulatory Visit: Payer: Self-pay

## 2018-07-26 ENCOUNTER — Ambulatory Visit
Admission: RE | Admit: 2018-07-26 | Discharge: 2018-07-26 | Disposition: A | Discharge status: Home / Self Care | Payer: Medicare Other | Source: Ambulatory Visit | Attending: Family Medicine | Admitting: Family Medicine

## 2018-07-26 ENCOUNTER — Other Ambulatory Visit: Payer: Self-pay | Admitting: Family Medicine

## 2018-07-26 VITALS — BP 90/62 | HR 46 | Temp 97.5°F | Resp 16 | Wt 218.6 lb

## 2018-07-26 DIAGNOSIS — I2510 Atherosclerotic heart disease of native coronary artery without angina pectoris: Secondary | ICD-10-CM | POA: Diagnosis not present

## 2018-07-26 DIAGNOSIS — M158 Other polyosteoarthritis: Secondary | ICD-10-CM | POA: Diagnosis not present

## 2018-07-26 DIAGNOSIS — M25561 Pain in right knee: Secondary | ICD-10-CM

## 2018-07-26 DIAGNOSIS — R739 Hyperglycemia, unspecified: Secondary | ICD-10-CM

## 2018-07-26 DIAGNOSIS — J441 Chronic obstructive pulmonary disease with (acute) exacerbation: Secondary | ICD-10-CM | POA: Insufficient documentation

## 2018-07-26 DIAGNOSIS — N183 Chronic kidney disease, stage 3 unspecified: Secondary | ICD-10-CM

## 2018-07-26 DIAGNOSIS — H3530 Unspecified macular degeneration: Secondary | ICD-10-CM

## 2018-07-26 MED ORDER — PREDNISONE 10 MG (21) PO TBPK: ORAL_TABLET | ORAL | 0 refills | Status: DC

## 2018-07-26 NOTE — Progress Notes (Signed)
Patient: Johnathan [name] Male    DOB: [birth year]   85 y.o.   MRN: [id number] Visit Date: 07/26/2018  Today's Provider: Wilhemena Durie, MD   Chief Complaint  Patient presents with  . Cough  . Knee Pain   Subjective:     Cough This is a new problem. The current episode started more than 1 month ago. The problem has been gradually worsening. The problem occurs constantly. The cough is productive of sputum. Associated symptoms include postnasal drip, rhinorrhea, shortness of breath and wheezing. Pertinent negatives include no chest pain, chills, ear congestion, ear pain, fever, headaches, heartburn, hemoptysis, myalgias, nasal congestion, rash, sore throat, sweats or weight loss. Nothing aggravates the symptoms. He has tried ipratropium inhaler and OTC cough suppressant for the symptoms. The treatment provided mild relief. His past medical history is significant for COPD.  Knee Pain   There was no injury mechanism. The pain is present in the right knee. The quality of the pain is described as aching. The pain is moderate. The pain has been fluctuating since onset. Associated symptoms include an inability to bear weight and muscle weakness. Numbness: in left arm triggered by cough. The symptoms are aggravated by movement and weight bearing.    Allergies  Allergen Reactions  . Sulfa Antibiotics Other (See Comments)    rash  . Penicillins Rash    Has patient had a PCN reaction causing immediate rash, facial/tongue/throat swelling, SOB or lightheadedness with hypotension: No Has patient had a PCN reaction causing severe rash involving mucus membranes or skin necrosis: No Has patient had a PCN reaction that required hospitalization: No Has patient had a PCN reaction occurring within the last 10 years: No If all of the above answers are "NO", then may proceed with Cephalosporin use.      Current Outpatient Medications:  .  albuterol (ACCUNEB) 0.63 MG/3ML nebulizer solution, USE 1  VIAL BY NEBULIZER EVERY 4 HOURS ASNEEDED FOR WHEEZING, Disp: 90 mL, Rfl: 3 .  albuterol (PROVENTIL HFA;VENTOLIN HFA) 108 (90 Base) MCG/ACT inhaler, Inhale 2 puffs into the lungs every 6 (six) hours as needed for wheezing or shortness of breath., Disp: 1 Inhaler, Rfl: 1 .  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  amLODipine (NORVASC) 5 MG tablet, TAKE 1 TABLET DAILY, Disp: 90 tablet, Rfl: 3 .  aspirin 325 MG EC tablet, Take 325 mg by mouth daily., Disp: , Rfl:  .  esomeprazole (NEXIUM) 20 MG capsule, TAKE 1 CAPSULE TWICE A DAY, Disp: 180 capsule, Rfl: 7 .  Fluticasone-Salmeterol (ADVAIR) 500-50 MCG/DOSE AEPB, Inhale 1 puff into the lungs 2 (two) times daily., Disp: 180 each, Rfl: 12 .  ipratropium-albuterol (DUONEB) 0.5-2.5 (3) MG/3ML SOLN, Take 3 mls by nebulization every 4 hours and prn. DX:J44.9, Disp: 360 mL, Rfl: 3 .  latanoprost (XALATAN) 0.005 % ophthalmic solution, Place 1 drop into both eyes at bedtime. , Disp: , Rfl:  .  Polyethylene Glycol 3350 (MIRALAX PO), Take by mouth as needed., Disp: , Rfl:  .  prednisoLONE acetate (PRED FORTE) 1 % ophthalmic suspension, Place 1 drop into the left eye daily. , Disp: , Rfl:  .  simvastatin (ZOCOR) 20 MG tablet, TAKE 1 TABLET DAILY, Disp: 90 tablet, Rfl: 3  Review of Systems  Constitutional: Negative for chills, fever and weight loss.  HENT: Positive for postnasal drip and rhinorrhea. Negative for ear pain and sore throat.   Eyes: Positive  for visual disturbance.  Respiratory: Positive for cough, shortness of breath and wheezing. Negative for hemoptysis.        Pt has spasms of coughing.  Cardiovascular: Negative for chest pain.  Gastrointestinal: Negative.  Negative for heartburn.  Endocrine: Negative.   Musculoskeletal: Positive for arthralgias. Negative for myalgias.  Skin: Negative for rash.  Allergic/Immunologic: Negative.   Neurological: Negative for headaches. Tremors: patient  reports experiences this in his left arm when coughin. Numbness: in left arm triggered by cough.  Hematological: Negative.   Psychiatric/Behavioral: Negative.     Social History   Tobacco Use  . Smoking status: Former Smoker    Packs/day: 1.00    Years: 30.00    Pack years: 30.00    Types: Cigarettes    Last attempt to quit: 02/16/2003    Years since quitting: 15.4  . Smokeless tobacco: Never Used  . Tobacco comment: is exposed to second hand smoke  Substance Use Topics  . Alcohol use: No      Objective:   BP 90/62   Pulse (!) 46   Temp (!) 97.5 F (36.4 C) (Oral)   Resp 16   Wt 218 lb 9.6 oz (99.2 kg)   SpO2 97%   BMI 33.24 kg/m  Vitals:   07/26/18 0900  BP: 90/62  Pulse: (!) 46  Resp: 16  Temp: (!) 97.5 F (36.4 C)  TempSrc: Oral  SpO2: 97%  Weight: 218 lb 9.6 oz (99.2 kg)     Physical Exam Vitals signs reviewed.  Constitutional:      Appearance: He is well-developed.  HENT:     Head: Normocephalic and atraumatic.     Right Ear: External ear normal.     Left Ear: External ear normal.     Nose: Nose normal.  Eyes:     General: No scleral icterus.    Conjunctiva/sclera: Conjunctivae normal.  Neck:     Thyroid: No thyromegaly.  Cardiovascular:     Rate and Rhythm: Normal rate and regular rhythm.     Heart sounds: Normal heart sounds.  Pulmonary:     Effort: Pulmonary effort is normal.     Breath sounds: Normal breath sounds.  Abdominal:     Palpations: Abdomen is soft.  Musculoskeletal:        General: Swelling present.     Comments: Mild right knee effusion with medial joint line tenderness.  Skin:    General: Skin is warm and dry.  Neurological:     Mental Status: He is alert and oriented to person, place, and time.  Psychiatric:        Mood and Affect: Mood normal.        Behavior: Behavior normal.        Thought Content: Thought content normal.        Judgment: Judgment normal.         Assessment & Plan    1. Chronic obstructive  pulmonary disease with acute exacerbation (HCC) Repeat CXR and refer back to pulmonary - DG Chest 2 View; Future - predniSONE (STERAPRED UNI-PAK 21 TAB) 10 MG (21) TBPK tablet; Taper as directed.  Dispense: 21 tablet; Refill: 0  2. Right knee pain, unspecified chronicity Short prednisone taper and refer for consideration of Steroid injection. - DG Knee Complete 4 Views Right; Future  3. ASCVD (arteriosclerotic cardiovascular disease)   4. 3-vessel CAD All risk factors treated  5. Other osteoarthritis involving multiple joints   6. Chronic kidney disease (CKD), stage III (  moderate) (HCC) Avoid NSAIDs.  7. Hyperglycemia   8. Age-related macular degeneration     I have done the exam and reviewed the above chart and it is accurate to the best of my knowledge. Development worker, community has been used in this note in any air is in the dictation or transcription are unintentional.  Wilhemena Durie, MD  [place] as a scribe for Wilhemena Durie, MD.,have documented all relevant documentation on the behalf of Wilhemena Durie, MD,as directed by  Wilhemena Durie, MD while in the presence of Wilhemena Durie, MD.

## 2018-07-26 NOTE — Telephone Encounter (Signed)
Please review

## 2018-07-27 ENCOUNTER — Telehealth: Payer: Self-pay

## 2018-07-27 DIAGNOSIS — J441 Chronic obstructive pulmonary disease with (acute) exacerbation: Secondary | ICD-10-CM

## 2018-07-27 NOTE — Telephone Encounter (Signed)
-----   Message from Jerrol Banana., MD sent at 07/27/2018  8:14 AM EDT ----- Arthritic changes.

## 2018-07-27 NOTE — Telephone Encounter (Signed)
-----   Message from Jerrol Banana., MD sent at 07/27/2018  8:14 AM EDT ----- Chest x-ray stable for lung disease.  Effusion is slightly larger.  Referral back to pulmonology if this is not already been done.

## 2018-07-27 NOTE — Telephone Encounter (Signed)
Patient was advised and referral was placed.

## 2018-07-27 NOTE — Telephone Encounter (Signed)
Patient was advised.  

## 2018-08-03 ENCOUNTER — Telehealth: Payer: Self-pay

## 2018-08-03 NOTE — Telephone Encounter (Signed)
Please put on Dr Bs schedule for this

## 2018-08-03 NOTE — Telephone Encounter (Signed)
Patient's daughter Marylen Ponto wanting to know if patient could be scheduled for steroid injection for knee pain. She says this was discussed during the last office visit. Please advise.

## 2018-08-16 ENCOUNTER — Ambulatory Visit (INDEPENDENT_AMBULATORY_CARE_PROVIDER_SITE_OTHER): Payer: Medicare Other | Admitting: Family Medicine

## 2018-08-16 ENCOUNTER — Encounter: Payer: Self-pay | Admitting: Family Medicine

## 2018-08-16 ENCOUNTER — Other Ambulatory Visit: Payer: Self-pay

## 2018-08-16 VITALS — BP 155/77 | HR 76 | Temp 97.9°F

## 2018-08-16 DIAGNOSIS — M1711 Unilateral primary osteoarthritis, right knee: Secondary | ICD-10-CM

## 2018-08-16 DIAGNOSIS — M25561 Pain in right knee: Secondary | ICD-10-CM | POA: Diagnosis not present

## 2018-08-16 NOTE — Progress Notes (Signed)
Patient: Johnathan [name] Male    DOB: [birth year]   85 y.o.   MRN: [id number] Visit Date: 08/16/2018  Today's Provider: Lavon Paganini, MD   Chief Complaint  Patient presents with  . Knee Pain   Subjective:    HPI  Right knee Pain:  Patient presents today for a steroid injection. Patient saw Dr. Rosanna Randy on 07/26/2018. He started Prednisone. Patient reports good compliance with treatment plan. Symptoms are unchanged.  He did not find the prednisone burst helpful to his knee pain.  Pain is in right medial knee.  He did have x-rays on 07/26/2018, which were reviewed today with patient.  He is present today with his daughter.  He states that many many years ago he had a corticosteroid injection into this knee and it helped significantly.  He finds that the pain limits his mobility further   Allergies  Allergen Reactions  . Sulfa Antibiotics Other (See Comments)    rash  . Penicillins Rash    Has patient had a PCN reaction causing immediate rash, facial/tongue/throat swelling, SOB or lightheadedness with hypotension: No Has patient had a PCN reaction causing severe rash involving mucus membranes or skin necrosis: No Has patient had a PCN reaction that required hospitalization: No Has patient had a PCN reaction occurring within the last 10 years: No If all of the above answers are "NO", then may proceed with Cephalosporin use.      Current Outpatient Medications:  .  albuterol (ACCUNEB) 0.63 MG/3ML nebulizer solution, USE 1 VIAL BY NEBULIZER EVERY 4 HOURS ASNEEDED FOR WHEEZING, Disp: 90 mL, Rfl: 3 .  albuterol (PROVENTIL HFA;VENTOLIN HFA) 108 (90 Base) MCG/ACT inhaler, Inhale 2 puffs into the lungs every 6 (six) hours as needed for wheezing or shortness of breath., Disp: 1 Inhaler, Rfl: 1 .  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  amLODipine (NORVASC) 5 MG tablet, TAKE 1 TABLET DAILY, Disp: 90 tablet, Rfl: 3 .  aspirin  325 MG EC tablet, Take 325 mg by mouth daily., Disp: , Rfl:  .  esomeprazole (NEXIUM) 20 MG capsule, TAKE 1 CAPSULE TWICE A DAY, Disp: 180 capsule, Rfl: 7 .  Fluticasone-Salmeterol (ADVAIR) 500-50 MCG/DOSE AEPB, Inhale 1 puff into the lungs 2 (two) times daily., Disp: 180 each, Rfl: 12 .  ipratropium-albuterol (DUONEB) 0.5-2.5 (3) MG/3ML SOLN, Take 3 mls by nebulization every 4 hours and prn. DX:J44.9, Disp: 360 mL, Rfl: 3 .  latanoprost (XALATAN) 0.005 % ophthalmic solution, Place 1 drop into both eyes at bedtime. , Disp: , Rfl:  .  Polyethylene Glycol 3350 (MIRALAX PO), Take by mouth as needed., Disp: , Rfl:  .  prednisoLONE acetate (PRED FORTE) 1 % ophthalmic suspension, Place 1 drop into the left eye daily. , Disp: , Rfl:  .  simvastatin (ZOCOR) 20 MG tablet, TAKE 1 TABLET DAILY, Disp: 90 tablet, Rfl: 3  Review of Systems  Constitutional: Negative.   Respiratory: Negative.   Genitourinary: Negative.   Musculoskeletal:       Right knee pain   Neurological: Negative.     Social History   Tobacco Use  . Smoking status: Former Smoker    Packs/day: 1.00    Years: 30.00    Pack years: 30.00    Types: Cigarettes    Quit date: 02/16/2003    Years since quitting: 15.5  . Smokeless tobacco: Never Used  . Tobacco comment: is exposed to second hand  smoke  Substance Use Topics  . Alcohol use: No      Objective:   BP (!) 155/77 (BP Location: Left Arm, Patient Position: Sitting, Cuff Size: Normal)   Pulse 76   Temp 97.9 F (36.6 C) (Oral)   SpO2 96%  Vitals:   08/16/18 0859  BP: (!) 155/77  Pulse: 76  Temp: 97.9 F (36.6 C)  TempSrc: Oral  SpO2: 96%     Physical Exam Vitals signs reviewed.  Constitutional:      Appearance: Normal appearance.  HENT:     Head: Normocephalic and atraumatic.  Cardiovascular:     Rate and Rhythm: Normal rate and regular rhythm.  Pulmonary:     Effort: Pulmonary effort is normal. No respiratory distress.  Musculoskeletal:     Comments: R  Knee: Normal to inspection with no erythema or obvious bony abnormalities.  Small effusion noted Palpation normal with no warmth  or patellar tenderness or condyle tenderness.  Mild tenderness along the medial joint line ROM normal in flexion and extension and lower leg rotation. Ligaments with solid consistent endpoints including ACL, PCL, LCL, MCL. Patellar and quadriceps tendons unremarkable. Hamstring and quadriceps strength is normal.   Skin:    General: Skin is warm and dry.     Capillary Refill: Capillary refill takes less than 2 seconds.     Findings: No rash.  Neurological:     Mental Status: He is alert. Mental status is at baseline.  Psychiatric:        Mood and Affect: Mood normal.        Behavior: Behavior normal.      Dg Knee Complete 4 Views Right  Result Date: 07/26/2018 CLINICAL DATA:  Anterior and medial knee pain over the last month. EXAM: RIGHT KNEE - COMPLETE 4+ VIEW COMPARISON:  None. FINDINGS: Mild medial compartment joint space narrowing. Lateral compartment and patellofemoral compartment appear normal. Small amount of joint fluid. No sign of intra-articular loose body. Regional arterial calcification is noted. IMPRESSION: Mild medial compartment joint space narrowing. Small joint effusion. Arterial calcification. Electronically Signed   By: Nelson Chimes M.D.   On: 07/26/2018 15:08    - reviewed images personally and discussed with patient and his daughter    Assessment & Plan   1. Primary osteoarthritis of right knee 2. Medial knee pain, right -Patient with longstanding right knee osteoarthritis -Reviewed recent imaging with patient and his daughter which shows significant loss of joint space in the medial compartment - Discussed with patient and his daughter that he is not a surgical candidate and so we will do what we can to improve his function and reduce his pain and fall risk -We agreed to go ahead with corticosteroid injection into the right knee today  -As below, discussed risks and benefits-see procedure note -Discussed return precautions - Could repeat in 3 to 6 months as needed   INJECTION: Patient was given informed consent,. Appropriate time out was taken. Area prepped and draped in usual sterile fashion. 2 cc of depo-medrol 40 mg/ml plus  4 cc of 1% lidocaine without epinephrine was injected into the right knee using a(n) anterior medial approach. The patient tolerated the procedure well. There were no complications. Post procedure instructions were given.    Return if symptoms worsen or fail to improve.   The entirety of the information documented in the History of Present Illness, Review of Systems and Physical Exam were personally obtained by me. Portions of this information were initially documented  by Casimer Leek, CMA and reviewed by me for thoroughness and accuracy.    Stacie Knutzen, Dionne Bucy, MD MPH [facility]

## 2018-08-23 ENCOUNTER — Other Ambulatory Visit: Payer: Self-pay

## 2018-08-23 ENCOUNTER — Encounter: Payer: Self-pay | Admitting: Family Medicine

## 2018-08-23 ENCOUNTER — Ambulatory Visit (INDEPENDENT_AMBULATORY_CARE_PROVIDER_SITE_OTHER): Payer: Medicare Other | Admitting: Family Medicine

## 2018-08-23 VITALS — BP 134/70 | HR 62 | Temp 98.2°F | Resp 24 | Ht 68.0 in | Wt 224.0 lb

## 2018-08-23 DIAGNOSIS — I2510 Atherosclerotic heart disease of native coronary artery without angina pectoris: Secondary | ICD-10-CM | POA: Diagnosis not present

## 2018-08-23 DIAGNOSIS — J441 Chronic obstructive pulmonary disease with (acute) exacerbation: Secondary | ICD-10-CM

## 2018-08-23 DIAGNOSIS — I6529 Occlusion and stenosis of unspecified carotid artery: Secondary | ICD-10-CM | POA: Diagnosis not present

## 2018-08-23 DIAGNOSIS — E7800 Pure hypercholesterolemia, unspecified: Secondary | ICD-10-CM

## 2018-08-23 DIAGNOSIS — M25561 Pain in right knee: Secondary | ICD-10-CM

## 2018-08-23 DIAGNOSIS — I255 Ischemic cardiomyopathy: Secondary | ICD-10-CM

## 2018-08-23 MED ORDER — AZITHROMYCIN 250 MG PO TABS: ORAL_TABLET | ORAL | 5 refills | Status: DC

## 2018-08-23 NOTE — Progress Notes (Signed)
Patient: Johnathan [name] Male    DOB: 1925/11/10   85 y.o.   MRN: [id number] Visit Date: 08/23/2018  Today's Provider: Wilhemena Durie, MD   Chief Complaint  Patient presents with  . Follow-up   Subjective:    HPI Patient comes in today for a follow up on COPD. CXR on 07/26/2018 was stable for lung disease, but effusion was slightly larger. It was recommended that he be referred back to pulmonology, but he reports that he was never scheduled an appt.   Patient also mentions that he was given a knee injection here in the office on 08/16/2018. He reports that this has not helped with his pain.   Allergies  Allergen Reactions  . Sulfa Antibiotics Other (See Comments)    rash  . Penicillins Rash    Has patient had a PCN reaction causing immediate rash, facial/tongue/throat swelling, SOB or lightheadedness with hypotension: No Has patient had a PCN reaction causing severe rash involving mucus membranes or skin necrosis: No Has patient had a PCN reaction that required hospitalization: No Has patient had a PCN reaction occurring within the last 10 years: No If all of the above answers are "NO", then may proceed with Cephalosporin use.      Current Outpatient Medications:  .  albuterol (ACCUNEB) 0.63 MG/3ML nebulizer solution, USE 1 VIAL BY NEBULIZER EVERY 4 HOURS ASNEEDED FOR WHEEZING, Disp: 90 mL, Rfl: 3 .  albuterol (PROVENTIL HFA;VENTOLIN HFA) 108 (90 Base) MCG/ACT inhaler, Inhale 2 puffs into the lungs every 6 (six) hours as needed for wheezing or shortness of breath., Disp: 1 Inhaler, Rfl: 1 .  amLODipine (NORVASC) 5 MG tablet, TAKE 1 TABLET DAILY, Disp: 90 tablet, Rfl: 3 .  aspirin 325 MG EC tablet, Take 325 mg by mouth daily., Disp: , Rfl:  .  esomeprazole (NEXIUM) 20 MG capsule, TAKE 1 CAPSULE TWICE A DAY, Disp: 180 capsule, Rfl: 7 .  Fluticasone-Salmeterol (ADVAIR) 500-50 MCG/DOSE AEPB, Inhale 1 puff into the lungs 2 (two) times daily., Disp: 180 each, Rfl: 12 .   ipratropium-albuterol (DUONEB) 0.5-2.5 (3) MG/3ML SOLN, Take 3 mls by nebulization every 4 hours and prn. DX:J44.9, Disp: 360 mL, Rfl: 3 .  latanoprost (XALATAN) 0.005 % ophthalmic solution, Place 1 drop into both eyes at bedtime. , Disp: , Rfl:  .  prednisoLONE acetate (PRED FORTE) 1 % ophthalmic suspension, Place 1 drop into the left eye daily. , Disp: , Rfl:  .  simvastatin (ZOCOR) 20 MG tablet, TAKE 1 TABLET DAILY, Disp: 90 tablet, Rfl: 3 .  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  Polyethylene Glycol 3350 (MIRALAX PO), Take by mouth as needed., Disp: , Rfl:   Review of Systems  Constitutional: Negative for activity change, appetite change, chills, diaphoresis, fatigue, fever and unexpected weight change.  Respiratory: Positive for cough and shortness of breath. Negative for apnea, choking, chest tightness, wheezing and stridor.   Cardiovascular: Negative for chest pain, palpitations and leg swelling.  Musculoskeletal: Positive for arthralgias and joint swelling.  Neurological: Negative for dizziness, light-headedness and headaches.  Psychiatric/Behavioral: Negative for agitation, self-injury, sleep disturbance and suicidal ideas. The patient is not nervous/anxious.     Social History   Tobacco Use  . Smoking status: Former Smoker    Packs/day: 1.00    Years: 30.00    Pack years: 30.00    Types: Cigarettes    Quit date: 02/16/2003  Years since quitting: 15.5  . Smokeless tobacco: Never Used  . Tobacco comment: is exposed to second hand smoke  Substance Use Topics  . Alcohol use: No      Objective:   BP 134/70 (BP Location: Left Arm, Patient Position: Sitting, Cuff Size: Large)   Pulse 62   Temp 98.2 F (36.8 C)   Resp (!) 24   Ht 5\' 8"  (1.727 m)   Wt 224 lb (101.6 kg)   SpO2 96%   BMI 34.06 kg/m  Vitals:   08/23/18 0837  BP: 134/70  Pulse: 62  Resp: (!) 24  Temp: 98.2 F (36.8 C)  SpO2: 96%  Weight:  224 lb (101.6 kg)  Height: 5\' 8"  (1.727 m)     Physical Exam Vitals signs reviewed.  Constitutional:      Appearance: He is well-developed.  HENT:     Head: Normocephalic and atraumatic.     Right Ear: External ear normal.     Left Ear: External ear normal.     Nose: Nose normal.  Eyes:     General: No scleral icterus.    Conjunctiva/sclera: Conjunctivae normal.  Neck:     Thyroid: No thyromegaly.  Cardiovascular:     Rate and Rhythm: Normal rate and regular rhythm.     Heart sounds: Normal heart sounds.  Pulmonary:     Effort: Pulmonary effort is normal.     Breath sounds: Normal breath sounds.  Abdominal:     Palpations: Abdomen is soft.  Skin:    General: Skin is warm and dry.  Neurological:     Mental Status: He is alert and oriented to person, place, and time.  Psychiatric:        Behavior: Behavior normal.        Thought Content: Thought content normal.        Judgment: Judgment normal.      No results found for any visits on 08/23/18.     Assessment & Plan    1. Medial knee pain, right No relief with steroid injection. Pt is interested in possible knee brace. Refer to ortho for consideration. - Ambulatory referral to Orthopedic Surgery  2. Chronic obstructive pulmonary disease with acute exacerbation (HCC) Refill azithromycin which he takes M-F.[facility]. - azithromycin (ZITHROMAX) 250 MG tablet; Take 1 tablet by mouth Monday-Friday.  Dispense: 30 tablet; Refill: 5  3. ASCVD (arteriosclerotic cardiovascular disease) All risk factors treated.  4. Stenosis of carotid artery, unspecified laterality   5. Cardiomyopathy, ischemic   6. Hypercholesteremia     I have done the exam and reviewed the above chart and it is accurate to the best of my knowledge. Development worker, community has been used in this note in any air is in the dictation or transcription are unintentional.  Wilhemena Durie, MD  [place]

## 2018-08-31 ENCOUNTER — Telehealth: Payer: Self-pay | Admitting: Internal Medicine

## 2018-08-31 DIAGNOSIS — J441 Chronic obstructive pulmonary disease with (acute) exacerbation: Secondary | ICD-10-CM

## 2018-08-31 MED ORDER — AZITHROMYCIN 250 MG PO TABS: ORAL_TABLET | ORAL | 0 refills | Status: DC

## 2018-08-31 NOTE — Telephone Encounter (Signed)
Called and spoke to pt's wife, [name] ([place]).  [name] is requesting 90 day supply [facility]  Rx has been sent to preferred pharmacy. Nothing further is needed.

## 2018-09-25 ENCOUNTER — Telehealth: Payer: Self-pay | Admitting: Internal Medicine

## 2018-09-25 NOTE — Telephone Encounter (Signed)
Spoke with pt. Pt c/o increased SOB and new cough over last couple of days but states much worse this morning. Pt during our conversation is winded and having difficulty getting his words out. Advised pt that he needs to go to ER for evaluation due to the extent of his SOB and new cough. Pt's son Johnathan [name] has agreed to take him. Called pt's daughter Johnathan [name] back with recommendation. Understanding verbalized.

## 2018-09-28 ENCOUNTER — Telehealth: Payer: Self-pay

## 2018-09-28 NOTE — Telephone Encounter (Signed)
Encounter error

## 2018-10-05 ENCOUNTER — Other Ambulatory Visit: Payer: Self-pay

## 2018-10-05 ENCOUNTER — Encounter: Payer: Self-pay | Admitting: Family Medicine

## 2018-10-05 ENCOUNTER — Ambulatory Visit (INDEPENDENT_AMBULATORY_CARE_PROVIDER_SITE_OTHER): Payer: Medicare Other | Admitting: Family Medicine

## 2018-10-05 VITALS — BP 132/70 | HR 80 | Temp 98.0°F | Resp 20 | Wt 222.0 lb

## 2018-10-05 DIAGNOSIS — N183 Chronic kidney disease, stage 3 unspecified: Secondary | ICD-10-CM

## 2018-10-05 DIAGNOSIS — J441 Chronic obstructive pulmonary disease with (acute) exacerbation: Secondary | ICD-10-CM | POA: Diagnosis not present

## 2018-10-05 DIAGNOSIS — I2510 Atherosclerotic heart disease of native coronary artery without angina pectoris: Secondary | ICD-10-CM | POA: Diagnosis not present

## 2018-10-05 DIAGNOSIS — I5023 Acute on chronic systolic (congestive) heart failure: Secondary | ICD-10-CM | POA: Diagnosis not present

## 2018-10-05 NOTE — Progress Notes (Signed)
Patient: Johnathan [name] Male    DOB: Nov 30, 1925   85 y.o.   MRN: [id number] Visit Date: 10/05/2018  Today's Provider: Wilhemena Durie, MD   Chief Complaint  Patient presents with   Hospitalization Follow-up   Subjective:     HPI  Follow up Hospitalization Transition of care visit. Patient was admitted to [facility] on 09/25/2018 and discharged on 09/28/2018. He was treated for CHF. Treatment for this included starting furosemide 40mg  and losartan 50mg .  He reports good compliance with treatment. He reports this condition is Improved. However, patient reports that he still gets short of breath at times.   Allergies  Allergen Reactions   Sulfa Antibiotics Other (See Comments)    rash   Penicillins Rash    Has patient had a PCN reaction causing immediate rash, facial/tongue/throat swelling, SOB or lightheadedness with hypotension: No Has patient had a PCN reaction causing severe rash involving mucus membranes or skin necrosis: No Has patient had a PCN reaction that required hospitalization: No Has patient had a PCN reaction occurring within the last 10 years: No If all of the above answers are "NO", then may proceed with Cephalosporin use.      Current Outpatient Medications:    albuterol (ACCUNEB) 0.63 MG/3ML nebulizer solution, USE 1 VIAL BY NEBULIZER EVERY 4 HOURS ASNEEDED FOR WHEEZING, Disp: 90 mL, Rfl: 3   albuterol (PROVENTIL HFA;VENTOLIN HFA) 108 (90 Base) MCG/ACT inhaler, Inhale 2 puffs into the lungs every 6 (six) hours as needed for wheezing or shortness of breath., Disp: 1 Inhaler, Rfl: 1   AMBULATORY NON FORMULARY MEDICATION, Medication Name: Flutter valve DX:J44.9, Disp: 1 each, Rfl: 0   amLODipine (NORVASC) 5 MG tablet, TAKE 1 TABLET DAILY, Disp: 90 tablet, Rfl: 3   aspirin 325 MG EC tablet, Take 325 mg by mouth daily., Disp: , Rfl:    azithromycin (ZITHROMAX) 250 MG tablet, Take 1 tablet by mouth Monday-Friday., Disp: 90 tablet, Rfl: 0   esomeprazole  (NEXIUM) 20 MG capsule, TAKE 1 CAPSULE TWICE A DAY, Disp: 180 capsule, Rfl: 7   Fluticasone-Salmeterol (ADVAIR) 500-50 MCG/DOSE AEPB, Inhale 1 puff into the lungs 2 (two) times daily., Disp: 180 each, Rfl: 12   ipratropium-albuterol (DUONEB) 0.5-2.5 (3) MG/3ML SOLN, Take 3 mls by nebulization every 4 hours and prn. DX:J44.9, Disp: 360 mL, Rfl: 3   latanoprost (XALATAN) 0.005 % ophthalmic solution, Place 1 drop into both eyes at bedtime. , Disp: , Rfl:    Polyethylene Glycol 3350 (MIRALAX PO), Take by mouth as needed., Disp: , Rfl:    prednisoLONE acetate (PRED FORTE) 1 % ophthalmic suspension, Place 1 drop into the left eye daily. , Disp: , Rfl:    simvastatin (ZOCOR) 20 MG tablet, TAKE 1 TABLET DAILY, Disp: 90 tablet, Rfl: 3  Review of Systems  Constitutional: Negative for activity change, appetite change, chills, diaphoresis, fatigue and fever.  HENT: Negative.   Eyes: Negative.   Respiratory: Negative for cough and shortness of breath.   Cardiovascular: Negative for chest pain, palpitations and leg swelling.  Gastrointestinal: Negative.   Endocrine: Negative for cold intolerance, heat intolerance, polydipsia, polyphagia and polyuria.  Genitourinary: Negative.   Musculoskeletal: Positive for arthralgias.  Allergic/Immunologic: Negative.   Neurological: Negative for dizziness, light-headedness and headaches.  Psychiatric/Behavioral: Negative for agitation and decreased concentration. The patient is not nervous/anxious.     Social History   Tobacco Use   Smoking status: Former Smoker    Packs/day: 1.00  Years: 30.00    Pack years: 30.00    Types: Cigarettes    Quit date: 02/16/2003    Years since quitting: 15.6   Smokeless tobacco: Never Used   Tobacco comment: is exposed to second hand smoke  Substance Use Topics   Alcohol use: No      Objective:   BP 132/70    Pulse 80    Temp 98 F (36.7 C)    Resp 20    Wt 222 lb (100.7 kg)    SpO2 94%    BMI 33.75 kg/m    Vitals:   10/05/18 1343  BP: 132/70  Pulse: 80  Resp: 20  Temp: 98 F (36.7 C)  SpO2: 94%  Weight: 222 lb (100.7 kg)     Physical Exam Vitals signs reviewed.  Constitutional:      Appearance: He is well-developed.  HENT:     Head: Normocephalic and atraumatic.     Right Ear: External ear normal.     Left Ear: External ear normal.     Nose: Nose normal.  Eyes:     General: No scleral icterus.    Conjunctiva/sclera: Conjunctivae normal.  Neck:     Thyroid: No thyromegaly.  Cardiovascular:     Rate and Rhythm: Normal rate and regular rhythm.     Heart sounds: Normal heart sounds.  Pulmonary:     Effort: Pulmonary effort is normal.     Breath sounds: Normal breath sounds.  Abdominal:     Palpations: Abdomen is soft.  Musculoskeletal:     Comments: 1+edema  Skin:    General: Skin is warm and dry.  Neurological:     General: No focal deficit present.     Mental Status: He is alert and oriented to person, place, and time.  Psychiatric:        Mood and Affect: Mood normal.        Behavior: Behavior normal.        Thought Content: Thought content normal.        Judgment: Judgment normal.      No results found for any visits on 10/05/18.     Assessment & Plan    1. Chronic obstructive pulmonary disease with acute exacerbation (HCC)  - CBC with Differential/Platelet  2. ASCVD (arteriosclerotic cardiovascular disease)  - Pro b natriuretic peptide (BNP)[id number]) - Renal function panel  3. Acute on chronic systolic congestive heart failure (HCC) EF 25%. Sees cardiology in a few days--consider Entresto. Transition of care visit. 4. Chronic kidney disease (CKD), stage III (moderate) (HCC) Check labs. More than 50% 25 minute visit spent in counseling or coordination of care      Wilhemena Durie, MD  [facility]

## 2018-10-05 NOTE — Patient Instructions (Signed)
Ask Cardiologist about Delene Loll.

## 2018-10-06 LAB — RENAL FUNCTION PANEL
Albumin: 4.2 g/dL (ref 3.5–4.6)
BUN/Creatinine Ratio: 16 (ref 10–24)
BUN: 23 mg/dL (ref 10–36)
CO2: 23 mmol/L (ref 20–29)
Calcium: 8.8 mg/dL (ref 8.6–10.2)
Chloride: 102 mmol/L (ref 96–106)
Creatinine, Ser: 1.4 mg/dL — ABNORMAL HIGH (ref 0.76–1.27)
GFR calc Af Amer: 50 mL/min/{1.73_m2} — ABNORMAL LOW (ref >59)
GFR calc non Af Amer: 44 mL/min/{1.73_m2} — ABNORMAL LOW (ref >59)
Glucose: 93 mg/dL (ref 65–99)
Phosphorus: 3.4 mg/dL (ref 2.8–4.1)
Potassium: 5.2 mmol/L (ref 3.5–5.2)
Sodium: 141 mmol/L (ref 134–144)

## 2018-10-06 LAB — CBC WITH DIFFERENTIAL/PLATELET
Basophils Absolute: 0 10*3/uL (ref 0.0–0.2)
Basos: 1 %
EOS (ABSOLUTE): 0 10*3/uL (ref 0.0–0.4)
Eos: 0 %
Hematocrit: 41.9 % (ref 37.5–51.0)
Hemoglobin: 14.1 g/dL (ref 13.0–17.7)
Immature Grans (Abs): 0 10*3/uL (ref 0.0–0.1)
Immature Granulocytes: 0 %
Lymphocytes Absolute: 0.9 10*3/uL (ref 0.7–3.1)
Lymphs: 11 %
MCH: 31.7 pg (ref 26.6–33.0)
MCHC: 33.7 g/dL (ref 31.5–35.7)
MCV: 94 fL (ref 79–97)
Monocytes Absolute: 0.5 10*3/uL (ref 0.1–0.9)
Monocytes: 7 %
Neutrophils Absolute: 6.6 10*3/uL (ref 1.4–7.0)
Neutrophils: 81 %
Platelets: 192 10*3/uL (ref 150–450)
RBC: 4.45 x10E6/uL (ref 4.14–5.80)
RDW: 13.2 % (ref 11.6–15.4)
WBC: 8.1 10*3/uL (ref 3.4–10.8)

## 2018-10-06 LAB — PRO B NATRIURETIC PEPTIDE: NT-Pro BNP: 5733 pg/mL — ABNORMAL HIGH (ref 0–486)

## 2018-10-09 ENCOUNTER — Telehealth: Payer: Self-pay

## 2018-10-09 NOTE — Telephone Encounter (Signed)
Patient advised as below.  

## 2018-10-09 NOTE — Telephone Encounter (Signed)
-----   Message from Jerrol Banana., MD sent at 10/06/2018  8:59 AM EDT ----- Labs expected--stay on present meds.

## 2018-10-17 DIAGNOSIS — IMO0002 Reserved for concepts with insufficient information to code with codable children: Secondary | ICD-10-CM | POA: Insufficient documentation

## 2018-10-17 DIAGNOSIS — I4891 Unspecified atrial fibrillation: Secondary | ICD-10-CM | POA: Insufficient documentation

## 2018-11-09 ENCOUNTER — Ambulatory Visit: Payer: Self-pay | Admitting: Family Medicine

## 2018-11-10 DIAGNOSIS — I5020 Unspecified systolic (congestive) heart failure: Secondary | ICD-10-CM | POA: Insufficient documentation

## 2018-11-20 ENCOUNTER — Ambulatory Visit (INDEPENDENT_AMBULATORY_CARE_PROVIDER_SITE_OTHER): Payer: Medicare Other | Admitting: Family Medicine

## 2018-11-20 ENCOUNTER — Telehealth: Payer: Self-pay

## 2018-11-20 ENCOUNTER — Ambulatory Visit
Admission: RE | Admit: 2018-11-20 | Discharge: 2018-11-20 | Disposition: A | Discharge status: Home / Self Care | Payer: Medicare Other | Source: Ambulatory Visit | Attending: Family Medicine | Admitting: Family Medicine

## 2018-11-20 ENCOUNTER — Other Ambulatory Visit: Payer: Self-pay

## 2018-11-20 ENCOUNTER — Encounter: Payer: Self-pay | Admitting: Family Medicine

## 2018-11-20 VITALS — BP 132/70 | HR 78 | Temp 98.0°F | Resp 20 | Wt 208.0 lb

## 2018-11-20 DIAGNOSIS — J441 Chronic obstructive pulmonary disease with (acute) exacerbation: Secondary | ICD-10-CM

## 2018-11-20 DIAGNOSIS — I2510 Atherosclerotic heart disease of native coronary artery without angina pectoris: Secondary | ICD-10-CM

## 2018-11-20 DIAGNOSIS — J90 Pleural effusion, not elsewhere classified: Secondary | ICD-10-CM

## 2018-11-20 DIAGNOSIS — I5023 Acute on chronic systolic (congestive) heart failure: Secondary | ICD-10-CM | POA: Insufficient documentation

## 2018-11-20 DIAGNOSIS — N1832 Chronic kidney disease, stage 3b: Secondary | ICD-10-CM

## 2018-11-20 MED ORDER — BISACODYL 5 MG PO TBEC
10.00 | DELAYED_RELEASE_TABLET | ORAL | Status: DC
Start: ? — End: 2018-11-20

## 2018-11-20 MED ORDER — BRIMONIDINE TARTRATE 0.2 % OP SOLN
1.00 | OPHTHALMIC | Status: DC
Start: 2018-11-18 — End: 2018-11-20

## 2018-11-20 MED ORDER — MELATONIN 3 MG PO TABS
3.00 | ORAL_TABLET | ORAL | Status: DC
Start: ? — End: 2018-11-20

## 2018-11-20 MED ORDER — ENOXAPARIN SODIUM 40 MG/0.4ML ~~LOC~~ SOLN
40.00 | SUBCUTANEOUS | Status: DC
Start: 2018-11-19 — End: 2018-11-20

## 2018-11-20 MED ORDER — DICLOFENAC SODIUM 1 % TD GEL
2.00 | TRANSDERMAL | Status: DC
Start: 2018-11-18 — End: 2018-11-20

## 2018-11-20 MED ORDER — ACETAMINOPHEN 325 MG PO TABS
650.00 | ORAL_TABLET | ORAL | Status: DC
Start: ? — End: 2018-11-20

## 2018-11-20 MED ORDER — IPRATROPIUM-ALBUTEROL 0.5-2.5 (3) MG/3ML IN SOLN
3.00 | RESPIRATORY_TRACT | Status: DC
Start: 2018-11-18 — End: 2018-11-20

## 2018-11-20 MED ORDER — ATORVASTATIN CALCIUM 20 MG PO TABS
10.00 | ORAL_TABLET | ORAL | Status: DC
Start: 2018-11-18 — End: 2018-11-20

## 2018-11-20 MED ORDER — BUDESONIDE 0.5 MG/2ML IN SUSP
0.50 | RESPIRATORY_TRACT | Status: DC
Start: 2018-11-19 — End: 2018-11-20

## 2018-11-20 MED ORDER — POLYETHYLENE GLYCOL 3350 17 G PO PACK
17.00 | PACK | ORAL | Status: DC
Start: 2018-11-19 — End: 2018-11-20

## 2018-11-20 MED ORDER — LIDOCAINE 5 % EX PTCH
1.00 | MEDICATED_PATCH | CUTANEOUS | Status: DC
Start: ? — End: 2018-11-20

## 2018-11-20 MED ORDER — ASPIRIN 81 MG PO CHEW
81.00 | CHEWABLE_TABLET | ORAL | Status: DC
Start: 2018-11-19 — End: 2018-11-20

## 2018-11-20 MED ORDER — PANTOPRAZOLE SODIUM 20 MG PO TBEC
20.00 | DELAYED_RELEASE_TABLET | ORAL | Status: DC
Start: 2018-11-19 — End: 2018-11-20

## 2018-11-20 MED ORDER — GABAPENTIN 100 MG PO CAPS
100.00 | ORAL_CAPSULE | ORAL | Status: DC
Start: 2018-11-18 — End: 2018-11-20

## 2018-11-20 MED ORDER — FUROSEMIDE 40 MG PO TABS
40.00 | ORAL_TABLET | ORAL | Status: DC
Start: ? — End: 2018-11-20

## 2018-11-20 MED ORDER — GENERIC EXTERNAL MEDICATION
1.00 | Status: DC
Start: 2018-11-18 — End: 2018-11-20

## 2018-11-20 MED ORDER — GENERIC EXTERNAL MEDICATION
Status: DC
Start: ? — End: 2018-11-20

## 2018-11-20 MED ORDER — ALBUTEROL SULFATE (2.5 MG/3ML) 0.083% IN NEBU
2.50 | INHALATION_SOLUTION | RESPIRATORY_TRACT | Status: DC
Start: ? — End: 2018-11-20

## 2018-11-20 NOTE — Progress Notes (Signed)
Patient: Johnathan [name] Male    DOB: 19-Jun-1925   85 y.o.   MRN: [id number] Visit Date: 11/20/2018  Today's Provider: Wilhemena Durie, MD   Chief Complaint  Patient presents with  . Follow-up   Subjective:   HPI Patient comes in today for a follow up. He was last seen in the office 1 month ago. Since last visit, he was also admitted at [facility] for shortness of breath for a total of 8 days. He was discharged to continue his current regime of Lasix 40mg  daily. He is feeling a little better.  BP Readings from Last 3 Encounters:  11/20/18 132/70  10/05/18 132/70  08/23/18 134/70   Wt Readings from Last 3 Encounters:  11/20/18 208 lb (94.3 kg)  10/05/18 222 lb (100.7 kg)  08/23/18 224 lb (101.6 kg)    Allergies  Allergen Reactions  . Sulfa Antibiotics Other (See Comments)    rash  . Penicillins Rash    Has patient had a PCN reaction causing immediate rash, facial/tongue/throat swelling, SOB or lightheadedness with hypotension: No Has patient had a PCN reaction causing severe rash involving mucus membranes or skin necrosis: No Has patient had a PCN reaction that required hospitalization: No Has patient had a PCN reaction occurring within the last 10 years: No If all of the above answers are "NO", then may proceed with Cephalosporin use.      Current Outpatient Medications:  .  albuterol (ACCUNEB) 0.63 MG/3ML nebulizer solution, USE 1 VIAL BY NEBULIZER EVERY 4 HOURS ASNEEDED FOR WHEEZING, Disp: 90 mL, Rfl: 3 .  albuterol (PROVENTIL HFA;VENTOLIN HFA) 108 (90 Base) MCG/ACT inhaler, Inhale 2 puffs into the lungs every 6 (six) hours as needed for wheezing or shortness of breath., Disp: 1 Inhaler, Rfl: 1 .  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  amLODipine (NORVASC) 5 MG tablet, TAKE 1 TABLET DAILY, Disp: 90 tablet, Rfl: 3 .  aspirin 325 MG EC tablet, Take 325 mg by mouth daily., Disp: , Rfl:  .  esomeprazole  (NEXIUM) 20 MG capsule, TAKE 1 CAPSULE TWICE A DAY, Disp: 180 capsule, Rfl: 7 .  Fluticasone-Salmeterol (ADVAIR) 500-50 MCG/DOSE AEPB, Inhale 1 puff into the lungs 2 (two) times daily., Disp: 180 each, Rfl: 12 .  ipratropium-albuterol (DUONEB) 0.5-2.5 (3) MG/3ML SOLN, Take 3 mls by nebulization every 4 hours and prn. DX:J44.9, Disp: 360 mL, Rfl: 3 .  latanoprost (XALATAN) 0.005 % ophthalmic solution, Place 1 drop into both eyes at bedtime. , Disp: , Rfl:  .  Polyethylene Glycol 3350 (MIRALAX PO), Take by mouth as needed., Disp: , Rfl:  .  prednisoLONE acetate (PRED FORTE) 1 % ophthalmic suspension, Place 1 drop into the left eye daily. , Disp: , Rfl:  .  simvastatin (ZOCOR) 20 MG tablet, TAKE 1 TABLET DAILY, Disp: 90 tablet, Rfl: 3 .  azithromycin (ZITHROMAX) 250 MG tablet, Take 1 tablet by mouth Monday-Friday. (Patient not taking: Reported on 11/20/2018), Disp: 90 tablet, Rfl: 0  Review of Systems  Constitutional: Negative for activity change and fatigue.  Eyes: Negative.   Respiratory: Positive for shortness of breath. Negative for cough.   Cardiovascular: Negative for chest pain, palpitations and leg swelling.  Gastrointestinal: Negative.   Endocrine: Negative.   Musculoskeletal: Positive for arthralgias. Negative for joint swelling and neck pain.  Allergic/Immunologic: Negative.   Neurological: Negative for dizziness, light-headedness and headaches.  Psychiatric/Behavioral: Negative for agitation, self-injury, sleep disturbance  and suicidal ideas. The patient is not nervous/anxious.     Social History   Tobacco Use  . Smoking status: Former Smoker    Packs/day: 1.00    Years: 30.00    Pack years: 30.00    Types: Cigarettes    Quit date: 02/16/2003    Years since quitting: 15.7  . Smokeless tobacco: Never Used  . Tobacco comment: is exposed to second hand smoke  Substance Use Topics  . Alcohol use: No      Objective:   BP 132/70   Pulse 78   Temp 98 F (36.7 C)   Resp 20    Wt 208 lb (94.3 kg)   SpO2 95%   BMI 31.63 kg/m  Vitals:   11/20/18 0813  BP: 132/70  Pulse: 78  Resp: 20  Temp: 98 F (36.7 C)  SpO2: 95%  Weight: 208 lb (94.3 kg)  Body mass index is 31.63 kg/m.   Physical Exam Vitals signs reviewed.  Constitutional:      Appearance: He is well-developed.  HENT:     Head: Normocephalic and atraumatic.     Right Ear: External ear normal.     Left Ear: External ear normal.     Nose: Nose normal.  Eyes:     General: No scleral icterus.    Conjunctiva/sclera: Conjunctivae normal.  Neck:     Thyroid: No thyromegaly.  Cardiovascular:     Rate and Rhythm: Normal rate and regular rhythm.     Heart sounds: Normal heart sounds.  Pulmonary:     Effort: Pulmonary effort is normal.     Breath sounds: Normal breath sounds.     Comments: Decreased breath sounds in left base. Abdominal:     Palpations: Abdomen is soft.  Musculoskeletal:     Comments: 1+edema  Skin:    General: Skin is warm and dry.  Neurological:     General: No focal deficit present.     Mental Status: He is alert and oriented to person, place, and time.  Psychiatric:        Mood and Affect: Mood normal.        Behavior: Behavior normal.        Thought Content: Thought content normal.        Judgment: Judgment normal.      No results found for any visits on 11/20/18.     Assessment & Plan    1. Chronic obstructive pulmonary disease with acute exacerbation (HCC) Clinically stable. - DG Chest 2 View; Future  2. ASCVD (arteriosclerotic cardiovascular disease)  - DG Chest 2 View; Future - Renal function panel  3. Acute on chronic systolic congestive heart failure (HCC) Clinically improved. - DG Chest 2 View; Future - Renal function panel - Brain natriuretic peptide  4. Pleural effusion, left F/u CXR. May need pleuricentesis - DG Chest 2 View; Future  5. Stage 3b chronic kidney disease   6. 3-vessel CAD All risk factors treated.More than 50% 25  minute visit spent in counseling or coordination of care      Wilhemena Durie, MD  [facility]

## 2018-11-20 NOTE — Telephone Encounter (Signed)
Left message to call back  

## 2018-11-20 NOTE — Telephone Encounter (Signed)
-----   Message from Jerrol Banana., MD sent at 11/20/2018 10:47 AM EDT ----- Pleural effusion smaller.

## 2018-11-21 NOTE — Telephone Encounter (Signed)
Patient notified of x-ray results.

## 2018-11-23 ENCOUNTER — Telehealth: Payer: Self-pay | Admitting: Family Medicine

## 2018-11-23 NOTE — Telephone Encounter (Signed)
Johnathan [name] w/ [facility]  Reporting: Pt had a fall yesterday in his bed room. Trying to fix his clothes and lost balance.  Abrasion on his left forearm. Otherwise seems to be ok.  Thanks, [employer]

## 2018-11-28 DIAGNOSIS — I5022 Chronic systolic (congestive) heart failure: Secondary | ICD-10-CM

## 2018-11-28 DIAGNOSIS — I2510 Atherosclerotic heart disease of native coronary artery without angina pectoris: Secondary | ICD-10-CM

## 2018-11-28 DIAGNOSIS — J441 Chronic obstructive pulmonary disease with (acute) exacerbation: Secondary | ICD-10-CM

## 2018-11-28 DIAGNOSIS — N183 Chronic kidney disease, stage 3 unspecified: Secondary | ICD-10-CM | POA: Diagnosis not present

## 2018-11-28 DIAGNOSIS — I130 Hypertensive heart and chronic kidney disease with heart failure and stage 1 through stage 4 chronic kidney disease, or unspecified chronic kidney disease: Secondary | ICD-10-CM | POA: Diagnosis not present

## 2018-11-28 DIAGNOSIS — I4891 Unspecified atrial fibrillation: Secondary | ICD-10-CM

## 2018-11-28 DIAGNOSIS — J90 Pleural effusion, not elsewhere classified: Secondary | ICD-10-CM

## 2018-11-29 ENCOUNTER — Telehealth: Payer: Self-pay | Admitting: Family Medicine

## 2018-11-29 NOTE — Telephone Encounter (Signed)
Pt has had a weight gain of about 4 lbs in the last couple of days  Low energy.  Congestion in lungs, no fever  Pamala Hurry would like a nurse to call her back  [phone]  [facility]

## 2018-11-29 NOTE — Telephone Encounter (Signed)
I do not see that he is on any diuretics.  With a 4 pound weight gain over read furosemide 40 mg every morning and let cardiology know that we started this today.  I would start today even if he does not get it till this afternoon.

## 2018-11-29 NOTE — Telephone Encounter (Signed)
Spoke with Pamala Hurry and she reports that the patient seems to be more short of breath than usual. She denies fever. She did hear rhonchi in both lungs on auscultation. He has swelling in both feet as well. She reports that the patient did mention that he has an appt with Cardiology tomorrow. He normally sees Dr. Ubaldo Glassing, but the patient told her he is seeing his PA. Just FYI.

## 2018-11-29 NOTE — Telephone Encounter (Signed)
Tried calling patient, no answer.

## 2018-11-29 NOTE — Telephone Encounter (Signed)
Tried calling patient 2x and line was busy. Will try again later.

## 2018-12-04 NOTE — Telephone Encounter (Signed)
FYI  I spoke to [place], Mr Reggio daughter.  She was with him during his cardiology visit last week.  Mr Sarajane Jews was on Lasix.  She said they were instructed to increased his Lasix from 40 mg daily to BID over the weekend.  They were to go back to daily starting today.  The cardiologist is supposed to call the daughter today to discuss any more changes.  Per the patient his breathing is no better.  Per the daughter his weight was up to 209 yesterday and they are using a baseline of 203.  This is even more than what it was last week.  She states she will call me with any further instruction from the cardiologist.  Mr. Madeira is going to call us after he has his weight checked today and let us know what it is.   With this information, no changes were made when I talked to her.   Mr Lamphear said that his cardiologist is in [place] and I believe that may be why we did not have the information about the Lasix.

## 2018-12-05 ENCOUNTER — Telehealth: Payer: Self-pay

## 2018-12-05 NOTE — Telephone Encounter (Signed)
FYI  Patient's daughter Johnathan [name] called today to say that the Cardiologist is going to keep Johnathan [name] on Lasix 40 mg BID.  She is going to get the refill from him. Also Johnathan [name] said that the baseline weight they are going by is 203.

## 2018-12-07 NOTE — Progress Notes (Signed)
Patient: Johnathan [name] Male    DOB: 12-28-1925   85 y.o.   MRN: [id number] Visit Date: 12/11/2018  Today's Provider: Wilhemena Durie, MD   Chief Complaint  Patient presents with  . Follow-up   Subjective:     HPI   Chronic obstructive pulmonary disease with acute exacerbation ([name]) From 11/20/2018-Clinically stable. Chest X-ray obtained, showing Pleural effusion smaller.  BP Readings from Last 3 Encounters:  12/11/18 122/66  11/20/18 132/70  10/05/18 132/70   Wt Readings from Last 3 Encounters:  12/11/18 211 lb (95.7 kg)  11/20/18 208 lb (94.3 kg)  10/05/18 222 lb (100.7 kg)    ASCVD (arteriosclerotic cardiovascular disease) From 11/20/2018-labs ordered, but zero done. Chest X-ray obtained, showing Pleural effusion smaller.  Acute on chronic systolic congestive heart failure ([name]) From 11/20/2018-Clinically improved. Labs ordered, but zero done. Chest X-ray obtained, showing Pleural effusion smaller. It is not listed on was meds but I do think the patient is taking Entresto 1/2 tablet twice a day.  He follows up with cardiology next week.  He feels pretty well. Pleural effusion, left From 11/20/2018-Follow up CXR. May need pleurocentesis.   Stage 3b chronic kidney disease From 11/20/2018-labs ordered, but zero done. Chest X-ray obtained, showing Pleural effusion smaller.  3-vessel CAD From 11/20/2018-All risk factors treated.   Allergies  Allergen Reactions  . Sulfa Antibiotics Other (See Comments)    rash  . Penicillins Rash    Has patient had a PCN reaction causing immediate rash, facial/tongue/throat swelling, SOB or lightheadedness with hypotension: No Has patient had a PCN reaction causing severe rash involving mucus membranes or skin necrosis: No Has patient had a PCN reaction that required hospitalization: No Has patient had a PCN reaction occurring within the last 10 years: No If all of the above answers are "NO", then may proceed with  Cephalosporin use.      Current Outpatient Medications:  .  albuterol (ACCUNEB) 0.63 MG/3ML nebulizer solution, USE 1 VIAL BY NEBULIZER EVERY 4 HOURS ASNEEDED FOR WHEEZING, Disp: 90 mL, Rfl: 3 .  albuterol (PROVENTIL HFA;VENTOLIN HFA) 108 (90 Base) MCG/ACT inhaler, Inhale 2 puffs into the lungs every 6 (six) hours as needed for wheezing or shortness of breath., Disp: 1 Inhaler, Rfl: 1 .  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  amLODipine (NORVASC) 5 MG tablet, TAKE 1 TABLET DAILY, Disp: 90 tablet, Rfl: 3 .  aspirin 325 MG EC tablet, Take 325 mg by mouth daily., Disp: , Rfl:  .  azithromycin (ZITHROMAX) 250 MG tablet, Take 1 tablet by mouth Monday-Friday. (Patient not taking: Reported on 11/20/2018), Disp: 90 tablet, Rfl: 0 .  esomeprazole (NEXIUM) 20 MG capsule, TAKE 1 CAPSULE TWICE A DAY, Disp: 180 capsule, Rfl: 7 .  Fluticasone-Salmeterol (ADVAIR) 500-50 MCG/DOSE AEPB, Inhale 1 puff into the lungs 2 (two) times daily., Disp: 180 each, Rfl: 12 .  ipratropium-albuterol (DUONEB) 0.5-2.5 (3) MG/3ML SOLN, Take 3 mls by nebulization every 4 hours and prn. DX:J44.9, Disp: 360 mL, Rfl: 3 .  latanoprost (XALATAN) 0.005 % ophthalmic solution, Place 1 drop into both eyes at bedtime. , Disp: , Rfl:  .  Polyethylene Glycol 3350 (MIRALAX PO), Take by mouth as needed., Disp: , Rfl:  .  prednisoLONE acetate (PRED FORTE) 1 % ophthalmic suspension, Place 1 drop into the left eye daily. , Disp: , Rfl:  .  simvastatin (ZOCOR) 20 MG tablet, TAKE 1 TABLET DAILY,  Disp: 90 tablet, Rfl: 3  Review of Systems  Constitutional: Negative for appetite change, chills and fever.  Respiratory: Negative for chest tightness, shortness of breath and wheezing.   Cardiovascular: Negative for chest pain and palpitations.  Gastrointestinal: Negative for abdominal pain, nausea and vomiting.    Social History   Tobacco Use  . Smoking status: Former Smoker     Packs/day: 1.00    Years: 30.00    Pack years: 30.00    Types: Cigarettes    Quit date: 02/16/2003    Years since quitting: 15.8  . Smokeless tobacco: Never Used  . Tobacco comment: is exposed to second hand smoke  Substance Use Topics  . Alcohol use: No      Objective:   BP 122/66   Pulse (!) 58   Temp 97.9 F (36.6 C)   Resp 20   Wt 211 lb (95.7 kg)   SpO2 94%   BMI 32.08 kg/m  Vitals:   12/11/18 0811  BP: 122/66  Pulse: (!) 58  Resp: 20  Temp: 97.9 F (36.6 C)  SpO2: 94%  Weight: 211 lb (95.7 kg)  Body mass index is 32.08 kg/m.   Physical Exam Vitals signs reviewed.  Constitutional:      Appearance: He is well-developed.  HENT:     Head: Normocephalic and atraumatic.     Right Ear: External ear normal.     Left Ear: External ear normal.     Nose: Nose normal.  Eyes:     General: No scleral icterus.    Conjunctiva/sclera: Conjunctivae normal.  Neck:     Thyroid: No thyromegaly.  Cardiovascular:     Rate and Rhythm: Normal rate and regular rhythm.     Heart sounds: Normal heart sounds.  Pulmonary:     Effort: Pulmonary effort is normal.     Breath sounds: Normal breath sounds.     Comments: Decreased breath sounds in left base. Abdominal:     Palpations: Abdomen is soft.  Musculoskeletal:     Comments: 1+edema  Skin:    General: Skin is warm and dry.  Neurological:     General: No focal deficit present.     Mental Status: He is alert and oriented to person, place, and time.  Psychiatric:        Mood and Affect: Mood normal.        Behavior: Behavior normal.        Thought Content: Thought content normal.        Judgment: Judgment normal.      No results found for any visits on 12/11/18.     Assessment & Plan    1. 3-vessel CAD All risk factors treated.  2. Stage 3b chronic kidney disease Check renal panel More than 50% 25 minute visit spent in counseling or coordination of care  3. Cardiomyopathy, ischemic Has cardiology  appointment in a week  4. Chronic systolic congestive heart failure ([name]) Now on Entresto.  Also Lasix twice a day per cardiology.  Check proBNP and renal panel  5. Pleural effusion, left Follow clinically  6. Chronic obstructive pulmonary disease with acute exacerbation ([name]) I will see him back after the first of the year.  He is clinically stable at this time.     Kaivon Livesey Cranford Mon, MD  [facility]

## 2018-12-11 ENCOUNTER — Ambulatory Visit (INDEPENDENT_AMBULATORY_CARE_PROVIDER_SITE_OTHER): Payer: Medicare Other | Admitting: Family Medicine

## 2018-12-11 ENCOUNTER — Encounter: Payer: Self-pay | Admitting: Family Medicine

## 2018-12-11 ENCOUNTER — Other Ambulatory Visit: Payer: Self-pay

## 2018-12-11 VITALS — BP 122/66 | HR 58 | Temp 97.9°F | Resp 20 | Wt 211.0 lb

## 2018-12-11 DIAGNOSIS — I2510 Atherosclerotic heart disease of native coronary artery without angina pectoris: Secondary | ICD-10-CM | POA: Diagnosis not present

## 2018-12-11 DIAGNOSIS — I5022 Chronic systolic (congestive) heart failure: Secondary | ICD-10-CM | POA: Diagnosis not present

## 2018-12-11 DIAGNOSIS — N1832 Chronic kidney disease, stage 3b: Secondary | ICD-10-CM | POA: Diagnosis not present

## 2018-12-11 DIAGNOSIS — J90 Pleural effusion, not elsewhere classified: Secondary | ICD-10-CM

## 2018-12-11 DIAGNOSIS — I255 Ischemic cardiomyopathy: Secondary | ICD-10-CM | POA: Diagnosis not present

## 2018-12-11 DIAGNOSIS — J441 Chronic obstructive pulmonary disease with (acute) exacerbation: Secondary | ICD-10-CM

## 2018-12-12 LAB — RENAL FUNCTION PANEL
Albumin: 3.7 g/dL (ref 3.5–4.6)
BUN/Creatinine Ratio: 15 (ref 10–24)
BUN: 22 mg/dL (ref 10–36)
CO2: 22 mmol/L (ref 20–29)
Calcium: 9 mg/dL (ref 8.6–10.2)
Chloride: 104 mmol/L (ref 96–106)
Creatinine, Ser: 1.48 mg/dL — ABNORMAL HIGH (ref 0.76–1.27)
GFR calc Af Amer: 47 mL/min/{1.73_m2} — ABNORMAL LOW (ref >59)
GFR calc non Af Amer: 41 mL/min/{1.73_m2} — ABNORMAL LOW (ref >59)
Glucose: 98 mg/dL (ref 65–99)
Phosphorus: 3.6 mg/dL (ref 2.8–4.1)
Potassium: 5.3 mmol/L — ABNORMAL HIGH (ref 3.5–5.2)
Sodium: 142 mmol/L (ref 134–144)

## 2018-12-12 LAB — PRO B NATRIURETIC PEPTIDE: NT-Pro BNP: 3569 pg/mL — ABNORMAL HIGH (ref 0–486)

## 2018-12-25 ENCOUNTER — Other Ambulatory Visit: Payer: Self-pay | Admitting: Internal Medicine

## 2018-12-25 NOTE — Progress Notes (Signed)
Patient: Johnathan [name] Male    DOB: 1925/03/17   85 y.o.   MRN: [id number] Visit Date: 12/26/2018  Today's Provider: Wilhemena Durie, MD   Chief Complaint  Patient presents with  . Follow-up  . Dizziness   Subjective:    Has Dizzy Spells when standing and moving at times. His Cardiologist believes it may be his Lasix but wished to leave him on it at this time. Overall pt is stable. He is weak but no other issues. HPI  Allergies  Allergen Reactions  . Sulfa Antibiotics Other (See Comments)    rash  . Penicillins Rash    Has patient had a PCN reaction causing immediate rash, facial/tongue/throat swelling, SOB or lightheadedness with hypotension: No Has patient had a PCN reaction causing severe rash involving mucus membranes or skin necrosis: No Has patient had a PCN reaction that required hospitalization: No Has patient had a PCN reaction occurring within the last 10 years: No If all of the above answers are "NO", then may proceed with Cephalosporin use.      Current Outpatient Medications:  .  furosemide (LASIX) 40 MG tablet, Take by mouth., Disp: , Rfl:  .  rosuvastatin (CRESTOR) 20 MG tablet, Take by mouth., Disp: , Rfl:  .  sacubitril-valsartan (ENTRESTO) 24-26 MG, Take by mouth., Disp: , Rfl:  .  albuterol (ACCUNEB) 0.63 MG/3ML nebulizer solution, USE 1 VIAL BY NEBULIZER EVERY 4 HOURS ASNEEDED FOR WHEEZING, Disp: 90 mL, Rfl: 3 .  albuterol (PROVENTIL HFA;VENTOLIN HFA) 108 (90 Base) MCG/ACT inhaler, Inhale 2 puffs into the lungs every 6 (six) hours as needed for wheezing or shortness of breath., Disp: 1 Inhaler, Rfl: 1 .  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  amLODipine (NORVASC) 5 MG tablet, TAKE 1 TABLET DAILY, Disp: 90 tablet, Rfl: 3 .  aspirin 325 MG EC tablet, Take 325 mg by mouth daily., Disp: , Rfl:  .  esomeprazole (NEXIUM) 20 MG capsule, TAKE 1 CAPSULE TWICE A DAY, Disp: 180 capsule, Rfl: 7 .   Fluticasone-Salmeterol (ADVAIR) 500-50 MCG/DOSE AEPB, Inhale 1 puff into the lungs 2 (two) times daily., Disp: 180 each, Rfl: 12 .  gabapentin (NEURONTIN) 100 MG capsule, , Disp: , Rfl:  .  INCRUSE ELLIPTA 62.5 MCG/INH AEPB, , Disp: , Rfl:  .  ipratropium-albuterol (DUONEB) 0.5-2.5 (3) MG/3ML SOLN, USE 1 VIAL IN NEBULIZER EVERY 4 HOURS - and as needed, Disp: 215 mL, Rfl: 1 .  latanoprost (XALATAN) 0.005 % ophthalmic solution, Place 1 drop into both eyes at bedtime. , Disp: , Rfl:  .  meloxicam (MOBIC) 15 MG tablet, , Disp: , Rfl:  .  Polyethylene Glycol 3350 (MIRALAX PO), Take by mouth as needed., Disp: , Rfl:  .  prednisoLONE acetate (PRED FORTE) 1 % ophthalmic suspension, Place 1 drop into the left eye daily. , Disp: , Rfl:   Review of Systems  Constitutional: Positive for fatigue. Negative for appetite change, chills and fever.  HENT: Negative.   Eyes: Negative.   Respiratory: Negative for chest tightness, shortness of breath and wheezing.   Cardiovascular: Negative for chest pain and palpitations.  Gastrointestinal: Negative for abdominal pain, nausea and vomiting.  Endocrine: Negative.   Musculoskeletal: Positive for arthralgias.  Allergic/Immunologic: Negative.   Hematological: Negative.   Psychiatric/Behavioral: Negative.     Social History   Tobacco Use  . Smoking status: Former Smoker    Packs/day: 1.00  Years: 30.00    Pack years: 30.00    Types: Cigarettes    Quit date: 02/16/2003    Years since quitting: 15.8  . Smokeless tobacco: Never Used  . Tobacco comment: is exposed to second hand smoke  Substance Use Topics  . Alcohol use: No      Objective:   BP 105/68   Pulse 80   Temp (!) 97.3 F (36.3 C) (Temporal)   Resp 18   Ht 5\' 8"  (1.727 m)   Wt 203 lb (92.1 kg)   BMI 30.87 kg/m  Vitals:   12/26/18 0833  BP: 105/68  Pulse: 80  Resp: 18  Temp: (!) 97.3 F (36.3 C)  TempSrc: Temporal  Weight: 203 lb (92.1 kg)  Height: 5\' 8"  (1.727 m)  Body mass  index is 30.87 kg/m.   Physical Exam Vitals signs reviewed.  Constitutional:      Appearance: He is well-developed.  HENT:     Head: Normocephalic and atraumatic.     Right Ear: External ear normal.     Left Ear: External ear normal.     Nose: Nose normal.  Eyes:     General: No scleral icterus.    Conjunctiva/sclera: Conjunctivae normal.  Neck:     Thyroid: No thyromegaly.  Cardiovascular:     Rate and Rhythm: Normal rate and regular rhythm.     Heart sounds: Normal heart sounds.  Pulmonary:     Effort: Pulmonary effort is normal.     Breath sounds: Normal breath sounds.     Comments: Decreased breath sounds in left base. Abdominal:     Palpations: Abdomen is soft.  Musculoskeletal:     Comments: 1+edema  Skin:    General: Skin is warm and dry.  Neurological:     General: No focal deficit present.     Mental Status: He is alert and oriented to person, place, and time.  Psychiatric:        Mood and Affect: Mood normal.        Behavior: Behavior normal.        Thought Content: Thought content normal.        Judgment: Judgment normal.      No results found for any visits on 12/26/18.     Assessment & Plan    1. HFrEF (heart failure with reduced ejection fraction) ([place]) I think pt is having mild orthostases  But due to CHF will follow. Needs proper fluid balance. Follow on Entresto.Continue if systolic stays above 123XX123. 2. Thoracic aortic aneurysm without rupture ([place])   3. 3-vessel CAD All risk factors treated.  4. Cardiomyopathy, ischemic   5. Flutter-fibrillation ([place])   6. Centrilobular emphysema (HCC) Clinically stable.     Richard Cranford Mon, MD  [facility]

## 2018-12-26 ENCOUNTER — Ambulatory Visit (INDEPENDENT_AMBULATORY_CARE_PROVIDER_SITE_OTHER): Payer: Medicare Other | Admitting: Family Medicine

## 2018-12-26 ENCOUNTER — Encounter: Payer: Self-pay | Admitting: Family Medicine

## 2018-12-26 ENCOUNTER — Other Ambulatory Visit: Payer: Self-pay

## 2018-12-26 VITALS — BP 105/68 | HR 80 | Temp 97.3°F | Resp 18 | Ht 68.0 in | Wt 203.0 lb

## 2018-12-26 DIAGNOSIS — I712 Thoracic aortic aneurysm, without rupture, unspecified: Secondary | ICD-10-CM

## 2018-12-26 DIAGNOSIS — I4891 Unspecified atrial fibrillation: Secondary | ICD-10-CM

## 2018-12-26 DIAGNOSIS — I5020 Unspecified systolic (congestive) heart failure: Secondary | ICD-10-CM

## 2018-12-26 DIAGNOSIS — I255 Ischemic cardiomyopathy: Secondary | ICD-10-CM

## 2018-12-26 DIAGNOSIS — I2510 Atherosclerotic heart disease of native coronary artery without angina pectoris: Secondary | ICD-10-CM | POA: Diagnosis not present

## 2018-12-26 DIAGNOSIS — IMO0002 Reserved for concepts with insufficient information to code with codable children: Secondary | ICD-10-CM

## 2018-12-26 DIAGNOSIS — J432 Centrilobular emphysema: Secondary | ICD-10-CM

## 2018-12-26 DIAGNOSIS — I4892 Unspecified atrial flutter: Secondary | ICD-10-CM

## 2018-12-27 ENCOUNTER — Telehealth: Payer: Self-pay | Admitting: Family Medicine

## 2018-12-27 NOTE — Telephone Encounter (Signed)
Please review. Thanks!  

## 2018-12-27 NOTE — Telephone Encounter (Signed)
Pamala Hurry, RN  w/ [facility]  [phone]  Reporting: Weight loss of 10 pounds since last Wed (12-20-18). Swelling in lower legs and feet is gone.  Thanks, [employer]

## 2019-02-07 ENCOUNTER — Other Ambulatory Visit: Payer: Self-pay | Admitting: Family Medicine

## 2019-02-07 NOTE — Telephone Encounter (Signed)
Requested medication (s) are due for refill today: yes  Requested medication (s) are on the active medication list: yes  Last refill: 11/18/18  Future visit scheduled: yes  Notes to clinic: [place]    Requested Prescriptions  Pending Prescriptions Disp Refills   [place] [Pharmacy Med Name: ADVAIR DISKUS 500-50 MCG/DOSE INH A] 60 each     Sig: INHALE 1 PUFF BY MOUTH TWICE DAILY      Pulmonology:  [employer] Passed - 02/07/2019  4:46 PM      Passed - Valid encounter within last 12 months    Recent Outpatient Visits           1 month ago HFrEF (heart failure with reduced ejection fraction) [facility])   [facility] Jerrol Banana., MD   1 month ago 3-vessel CAD   [facility] Jerrol Banana., MD   2 months ago Chronic obstructive pulmonary disease with acute exacerbation [facility])   [facility] Jerrol Banana., MD   4 months ago Chronic obstructive pulmonary disease with acute exacerbation [facility])   [facility] Jerrol Banana., MD   5 months ago Medial knee pain, right   [facility] Jerrol Banana., MD       Future Appointments             In 3 weeks Jerrol Banana., MD [facility], [place]   In 1 month Jerrol Banana., MD [facility], [place]

## 2019-02-22 ENCOUNTER — Other Ambulatory Visit: Payer: Self-pay | Admitting: Family Medicine

## 2019-02-22 MED ORDER — ADVAIR DISKUS 500-50 MCG/DOSE IN AEPB: 1.0000 | INHALATION_SPRAY | Freq: Two times a day (BID) | RESPIRATORY_TRACT | 5 refills | Status: DC

## 2019-02-22 MED ORDER — FUROSEMIDE 40 MG PO TABS: 40.0000 mg | ORAL_TABLET | Freq: Every day | ORAL | 11 refills | Status: DC

## 2019-02-22 NOTE — Telephone Encounter (Signed)
[facility] faxed refill request for the following medications:  furosemide (LASIX) tablet 40 mg  Fluticasone-Salmeterol (ADVAIR DISKUS) 100-50 MCG/DOSE AEPB   Please advise.

## 2019-02-22 NOTE — Telephone Encounter (Signed)
Please advise refill for furosemide 40 mg?

## 2019-02-26 ENCOUNTER — Other Ambulatory Visit: Payer: Self-pay | Admitting: *Deleted

## 2019-02-26 ENCOUNTER — Other Ambulatory Visit: Payer: Self-pay

## 2019-02-26 MED ORDER — FUROSEMIDE 40 MG PO TABS: 40.0000 mg | ORAL_TABLET | Freq: Every day | ORAL | 3 refills | Status: DC

## 2019-02-26 MED ORDER — ADVAIR DISKUS 500-50 MCG/DOSE IN AEPB: 1.0000 | INHALATION_SPRAY | Freq: Two times a day (BID) | RESPIRATORY_TRACT | 3 refills | Status: AC

## 2019-03-06 ENCOUNTER — Ambulatory Visit: Payer: Self-pay | Admitting: Family Medicine

## 2019-03-12 ENCOUNTER — Ambulatory Visit: Payer: Self-pay | Admitting: Family Medicine

## 2019-03-13 ENCOUNTER — Telehealth: Payer: Self-pay | Admitting: Internal Medicine

## 2019-03-13 ENCOUNTER — Telehealth: Payer: Self-pay | Admitting: Family Medicine

## 2019-03-13 MED ORDER — INCRUSE ELLIPTA 62.5 MCG/INH IN AEPB: 1.0000 | INHALATION_SPRAY | Freq: Every day | RESPIRATORY_TRACT | 0 refills | Status: AC

## 2019-03-13 MED ORDER — AZITHROMYCIN 250 MG PO TABS: ORAL_TABLET | ORAL | 0 refills | Status: AC

## 2019-03-13 MED ORDER — ESOMEPRAZOLE MAGNESIUM 20 MG PO CPDR: 20.0000 mg | DELAYED_RELEASE_CAPSULE | Freq: Two times a day (BID) | ORAL | 7 refills | Status: DC

## 2019-03-13 MED ORDER — ESOMEPRAZOLE MAGNESIUM 20 MG PO CPDR: 20.0000 mg | DELAYED_RELEASE_CAPSULE | Freq: Two times a day (BID) | ORAL | 0 refills | Status: DC

## 2019-03-13 NOTE — Telephone Encounter (Signed)
No PA needed per pharmacy. Sent in new refill and also #30 to [facility].

## 2019-03-13 NOTE — Telephone Encounter (Signed)
Pt's pharmacy stated pt needs PA for esomeprazole (NEXIUM) 20 MG capsule   PA # [id number]

## 2019-03-13 NOTE — Telephone Encounter (Signed)
Spoke to [place] with [facility], who stated that incruse is not covered by insurance but spiriva is.   Please note that pt was previously on spiriva and was switched to incruse, due to spiriva not being covered previously.  Pt is also taking advair.   Dk, please advise. Thanks.

## 2019-03-13 NOTE — Telephone Encounter (Signed)
Patient daughter called to request a 30 day supply of esomeprazole (NEXIUM) 20 MG capsule  Sent to  [place], [place]. Phone:  [phone]  Fax:  [phone]     Today please

## 2019-03-13 NOTE — Telephone Encounter (Signed)
Prescribe spiriva respimat 1.25  if not covered spiriva handihaler 18

## 2019-03-13 NOTE — Telephone Encounter (Signed)
Spoke to pt's daughter, [name] ([place]). [name] is requesting refill for incruse and azithro. Pt is past due for an appt, as pt was instructed to f/u around 09/2018.  [name] is aware that appt is needed and that one month supply of medication can be sent to preferred pharmacy.  Rx for incurse and azithro has been sent to [facility] per Rhonda's request.  Pt has been scheduled for f/u with Wyn Quaker, NP on 03/20/2019. Nothing further is needed.

## 2019-03-14 NOTE — Telephone Encounter (Addendum)
Lm with pt's daughter, Rhonda(DPR) to make her aware of change.

## 2019-03-15 NOTE — Telephone Encounter (Signed)
Spoke to pt's daughter, Rhonda(DPR) and relayed below message.  [name] stated that she was made aware by [facility] that co pay for spiriva is over 100 dollars a month. Per [name], pt can not afford this.  I have recommended that pt or [employer] and request list of cheaper alternatives.  [name] will call back with update.

## 2019-03-20 ENCOUNTER — Ambulatory Visit: Payer: Medicare Other | Admitting: Pulmonary Disease

## 2019-03-21 NOTE — Telephone Encounter (Signed)
Lm for pt's daughter, Rhonda(DPR) for update.

## 2019-03-22 NOTE — Telephone Encounter (Signed)
Called pts daughter twice and phone rang continuously. Was not able to leave a VM.

## 2019-03-23 DIAGNOSIS — R251 Tremor, unspecified: Secondary | ICD-10-CM | POA: Insufficient documentation

## 2019-03-23 NOTE — Telephone Encounter (Signed)
F/u with daughter and she stated she has tried to call the insurance company several times and it is hard for her to do so while at work. She gets transferred multiple times and gets frustrated and ends the call. She stated she will try again and give [place] a call.

## 2019-03-23 NOTE — Telephone Encounter (Signed)
Phone went straight to vm. LMTCB.

## 2019-03-27 NOTE — Progress Notes (Signed)
Patient: Johnathan [name] Male    DOB: [birth year]   86 y.o.   MRN: [id number] Visit Date: 03/28/2019  Today's Provider: Wilhemena Durie, MD   No chief complaint on file.  Subjective:     HPI   HFrEF (heart failure with reduced ejection fraction) ([place]) From 12/26/2018-Needs proper fluid balance. Follow on Entresto.Continue if systolic stays above 123XX123. Patient no longer on Entresto. 3-vessel CAD From 12/26/2018-All risk factors treated.  Centrilobular emphysema ([place]) From 12/26/2018-Clinically stable.   Allergies  Allergen Reactions  . Sulfa Antibiotics Other (See Comments)    rash  . Penicillins Rash    Has patient had a PCN reaction causing immediate rash, facial/tongue/throat swelling, SOB or lightheadedness with hypotension: No Has patient had a PCN reaction causing severe rash involving mucus membranes or skin necrosis: No Has patient had a PCN reaction that required hospitalization: No Has patient had a PCN reaction occurring within the last 10 years: No If all of the above answers are "NO", then may proceed with Cephalosporin use.      Current Outpatient Medications:  .  ADVAIR DISKUS 500-50 MCG/DOSE AEPB, Inhale 1 puff into the lungs 2 (two) times daily., Disp: 180 each, Rfl: 3 .  albuterol (ACCUNEB) 0.63 MG/3ML nebulizer solution, USE 1 VIAL BY NEBULIZER EVERY 4 HOURS ASNEEDED FOR WHEEZING, Disp: 90 mL, Rfl: 3 .  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  amLODipine (NORVASC) 5 MG tablet, TAKE 1 TABLET DAILY, Disp: 90 tablet, Rfl: 3 .  aspirin 325 MG EC tablet, Take 325 mg by mouth daily., Disp: , Rfl:  .  esomeprazole (NEXIUM) 20 MG capsule, Take 1 capsule (20 mg total) by mouth 2 (two) times daily., Disp: 30 capsule, Rfl: 0 .  furosemide (LASIX) 40 MG tablet, Take 1 tablet (40 mg total) by mouth daily., Disp: 90 tablet, Rfl: 3 .  gabapentin (NEURONTIN) 100 MG capsule, , Disp: , Rfl:  .  INCRUSE  ELLIPTA 62.5 MCG/INH AEPB, Inhale 1 puff into the lungs daily., Disp: 30 each, Rfl: 0 .  latanoprost (XALATAN) 0.005 % ophthalmic solution, Place 1 drop into both eyes at bedtime. , Disp: , Rfl:  .  Polyethylene Glycol 3350 (MIRALAX PO), Take by mouth as needed., Disp: , Rfl:  .  prednisoLONE acetate (PRED FORTE) 1 % ophthalmic suspension, Place 1 drop into the left eye daily. , Disp: , Rfl:  .  rosuvastatin (CRESTOR) 20 MG tablet, Take by mouth., Disp: , Rfl:  .  albuterol (PROVENTIL HFA;VENTOLIN HFA) 108 (90 Base) MCG/ACT inhaler, Inhale 2 puffs into the lungs every 6 (six) hours as needed for wheezing or shortness of breath. (Patient not taking: Reported on 03/28/2019), Disp: 1 Inhaler, Rfl: 1 .  ipratropium-albuterol (DUONEB) 0.5-2.5 (3) MG/3ML SOLN, USE 1 VIAL IN NEBULIZER EVERY 4 HOURS - and as needed (Patient not taking: Reported on 03/28/2019), Disp: 215 mL, Rfl: 1 .  meloxicam (MOBIC) 15 MG tablet, , Disp: , Rfl:  .  sacubitril-valsartan (ENTRESTO) 24-26 MG, Take by mouth., Disp: , Rfl:   Review of Systems  Constitutional: Positive for fatigue. Negative for appetite change, chills and fever.  HENT: Negative.   Eyes: Negative.   Respiratory: Positive for shortness of breath. Negative for chest tightness and wheezing.   Cardiovascular: Negative for chest pain and palpitations.  Gastrointestinal: Negative for abdominal pain, nausea and vomiting.  Endocrine: Negative.   Musculoskeletal: Positive for arthralgias.  Allergic/Immunologic:  Negative.   Hematological: Negative.   Psychiatric/Behavioral: Negative.     Social History   Tobacco Use  . Smoking status: Former Smoker    Packs/day: 1.00    Years: 30.00    Pack years: 30.00    Types: Cigarettes    Quit date: 02/16/2003    Years since quitting: 16.1  . Smokeless tobacco: Never Used  . Tobacco comment: is exposed to second hand smoke  Substance Use Topics  . Alcohol use: No      Objective:   There were no vitals taken for  this visit. There were no vitals filed for this visit.There is no height or weight on file to calculate BMI.   Physical Exam Vitals reviewed.  Constitutional:      Appearance: He is well-developed.  HENT:     Head: Normocephalic and atraumatic.     Right Ear: External ear normal.     Left Ear: External ear normal.     Nose: Nose normal.  Eyes:     General: No scleral icterus.    Conjunctiva/sclera: Conjunctivae normal.  Neck:     Thyroid: No thyromegaly.  Cardiovascular:     Rate and Rhythm: Normal rate and regular rhythm.     Heart sounds: Normal heart sounds.  Pulmonary:     Effort: Pulmonary effort is normal.     Breath sounds: Normal breath sounds.     Comments: Decreased breath sounds in left base. Abdominal:     Palpations: Abdomen is soft.  Musculoskeletal:     Comments: 1+edema  Skin:    General: Skin is warm and dry.  Neurological:     General: No focal deficit present.     Mental Status: He is alert and oriented to person, place, and time.  Psychiatric:        Mood and Affect: Mood normal.        Behavior: Behavior normal.        Thought Content: Thought content normal.        Judgment: Judgment normal.      No results found for any visits on 03/28/19.     Assessment & Plan    1. Centrilobular emphysema ([place]) Patient on inhalers. - Lipid panel - CBC with Differential/Platelet - Comprehensive metabolic panel - TSH  2. Chronic systolic congestive heart failure ([place]) Not taking Entresto presently. - Lipid panel - CBC with Differential/Platelet - Comprehensive metabolic panel - TSH  3. Essential hypertension On amlodipine - Lipid panel - CBC with Differential/Platelet - Comprehensive metabolic panel - TSH  4. Hypercholesteremia On Crestor. - Lipid panel - CBC with Differential/Platelet - Comprehensive metabolic panel - TSH  5. PAD (peripheral artery disease) (HCC)  - Lipid panel - CBC with Differential/Platelet - Comprehensive  metabolic panel - TSH  6. Flutter-fibrillation ([place])   7. Cardiomyopathy, ischemic   8. 3-vessel CAD All risk factors treated.     Richard Cranford Mon, MD  [facility]

## 2019-03-28 ENCOUNTER — Ambulatory Visit (INDEPENDENT_AMBULATORY_CARE_PROVIDER_SITE_OTHER): Payer: Medicare Other | Admitting: Family Medicine

## 2019-03-28 ENCOUNTER — Other Ambulatory Visit: Payer: Self-pay

## 2019-03-28 ENCOUNTER — Encounter: Payer: Self-pay | Admitting: Family Medicine

## 2019-03-28 VITALS — BP 107/67 | HR 78 | Temp 96.9°F | Wt 200.4 lb

## 2019-03-28 DIAGNOSIS — I5022 Chronic systolic (congestive) heart failure: Secondary | ICD-10-CM | POA: Diagnosis not present

## 2019-03-28 DIAGNOSIS — I2510 Atherosclerotic heart disease of native coronary artery without angina pectoris: Secondary | ICD-10-CM

## 2019-03-28 DIAGNOSIS — I4892 Unspecified atrial flutter: Secondary | ICD-10-CM

## 2019-03-28 DIAGNOSIS — I10 Essential (primary) hypertension: Secondary | ICD-10-CM

## 2019-03-28 DIAGNOSIS — I255 Ischemic cardiomyopathy: Secondary | ICD-10-CM

## 2019-03-28 DIAGNOSIS — E7800 Pure hypercholesterolemia, unspecified: Secondary | ICD-10-CM

## 2019-03-28 DIAGNOSIS — IMO0002 Reserved for concepts with insufficient information to code with codable children: Secondary | ICD-10-CM

## 2019-03-28 DIAGNOSIS — I4891 Unspecified atrial fibrillation: Secondary | ICD-10-CM

## 2019-03-28 DIAGNOSIS — I739 Peripheral vascular disease, unspecified: Secondary | ICD-10-CM

## 2019-03-28 DIAGNOSIS — J432 Centrilobular emphysema: Secondary | ICD-10-CM | POA: Diagnosis not present

## 2019-03-29 LAB — COMPREHENSIVE METABOLIC PANEL
ALT: 6 IU/L (ref 0–44)
AST: 15 IU/L (ref 0–40)
Albumin/Globulin Ratio: 1.5 (ref 1.2–2.2)
Albumin: 3.7 g/dL (ref 3.5–4.6)
Alkaline Phosphatase: 81 IU/L (ref 39–117)
BUN/Creatinine Ratio: 14 (ref 10–24)
BUN: 23 mg/dL (ref 10–36)
Bilirubin Total: 0.6 mg/dL (ref 0.0–1.2)
CO2: 20 mmol/L (ref 20–29)
Calcium: 8.6 mg/dL (ref 8.6–10.2)
Chloride: 101 mmol/L (ref 96–106)
Creatinine, Ser: 1.59 mg/dL — ABNORMAL HIGH (ref 0.76–1.27)
GFR calc Af Amer: 43 mL/min/{1.73_m2} — ABNORMAL LOW (ref >59)
GFR calc non Af Amer: 37 mL/min/{1.73_m2} — ABNORMAL LOW (ref >59)
Globulin, Total: 2.5 g/dL (ref 1.5–4.5)
Glucose: 90 mg/dL (ref 65–99)
Potassium: 4.2 mmol/L (ref 3.5–5.2)
Sodium: 139 mmol/L (ref 134–144)
Total Protein: 6.2 g/dL (ref 6.0–8.5)

## 2019-03-29 LAB — LIPID PANEL
Chol/HDL Ratio: 2.6 ratio (ref 0.0–5.0)
Cholesterol, Total: 102 mg/dL (ref 100–199)
HDL: 39 mg/dL — ABNORMAL LOW (ref >39)
LDL Chol Calc (NIH): 51 mg/dL (ref 0–99)
Triglycerides: 51 mg/dL (ref 0–149)
VLDL Cholesterol Cal: 12 mg/dL (ref 5–40)

## 2019-03-29 LAB — CBC WITH DIFFERENTIAL/PLATELET
Basophils Absolute: 0 10*3/uL (ref 0.0–0.2)
Basos: 1 %
EOS (ABSOLUTE): 0.1 10*3/uL (ref 0.0–0.4)
Eos: 1 %
Hematocrit: 38.6 % (ref 37.5–51.0)
Hemoglobin: 13.2 g/dL (ref 13.0–17.7)
Immature Grans (Abs): 0 10*3/uL (ref 0.0–0.1)
Immature Granulocytes: 0 %
Lymphocytes Absolute: 0.8 10*3/uL (ref 0.7–3.1)
Lymphs: 15 %
MCH: 32.4 pg (ref 26.6–33.0)
MCHC: 34.2 g/dL (ref 31.5–35.7)
MCV: 95 fL (ref 79–97)
Monocytes Absolute: 0.4 10*3/uL (ref 0.1–0.9)
Monocytes: 7 %
Neutrophils Absolute: 4.2 10*3/uL (ref 1.4–7.0)
Neutrophils: 76 %
Platelets: 196 10*3/uL (ref 150–450)
RBC: 4.08 x10E6/uL — ABNORMAL LOW (ref 4.14–5.80)
RDW: 13.2 % (ref 11.6–15.4)
WBC: 5.5 10*3/uL (ref 3.4–10.8)

## 2019-03-29 LAB — TSH: TSH: 2.18 u[IU]/mL (ref 0.450–4.500)

## 2019-03-30 NOTE — Telephone Encounter (Addendum)
Lm for pt's daughter, Rhonda(DPR)for update.

## 2019-04-02 ENCOUNTER — Telehealth: Payer: Self-pay

## 2019-04-02 NOTE — Telephone Encounter (Signed)
Patient was advised.  

## 2019-04-02 NOTE — Telephone Encounter (Signed)
-----   Message from Jerrol Banana., MD sent at 04/01/2019  6:01 PM EST ----- Labs stable.

## 2019-06-12 ENCOUNTER — Telehealth: Payer: Self-pay

## 2019-06-12 NOTE — Telephone Encounter (Signed)
Copied from [place] [id number]. Topic: General - Inquiry >> Jun 12, 2019  3:22 PM Richardo Priest, [place] wrote: Reason for CRM: [name] with [facility] called in stating she has recently spoken with patient and the patient expressed to her that he feels his breathing is getting worse. Patient now is complaining of being more short of breathe, feeling more chest congestion, and the inability to cough. Patient has been using his nebulizer 3x daily but [name] has advised him to increase this. RN also suggested that patient take plain robitussin to help lossen mucus and to also wear his oxygen. Please advise as these are all new to patient. Call back number for RN is [phone].

## 2019-06-13 NOTE — Telephone Encounter (Signed)
Johnathan [name] was advised.

## 2019-06-13 NOTE — Telephone Encounter (Signed)
Plan as outlined is reasonable.

## 2019-06-18 ENCOUNTER — Ambulatory Visit: Payer: Self-pay

## 2019-06-18 DIAGNOSIS — R05 Cough: Secondary | ICD-10-CM

## 2019-06-18 DIAGNOSIS — R0989 Other specified symptoms and signs involving the circulatory and respiratory systems: Secondary | ICD-10-CM

## 2019-06-18 DIAGNOSIS — R059 Cough, unspecified: Secondary | ICD-10-CM

## 2019-06-18 MED ORDER — DOXYCYCLINE HYCLATE 100 MG PO TABS: 100.0000 mg | ORAL_TABLET | Freq: Two times a day (BID) | ORAL | 0 refills | Status: DC

## 2019-06-18 MED ORDER — PREDNISONE 10 MG PO TABS: ORAL_TABLET | ORAL | 0 refills | Status: DC

## 2019-06-18 NOTE — Addendum Note (Signed)
Addended by: Julieta Bellini on: 06/18/2019 05:10 PM   Modules accepted: Orders

## 2019-06-18 NOTE — Telephone Encounter (Signed)
Rx's sent to pharmacy. Johnathan [name] was advised.

## 2019-06-18 NOTE — Telephone Encounter (Signed)
Try doxycycline 100 mg twice a day for 5 days and prednisone 10 mg tablets, 6-day taper, number 21 tablet

## 2019-06-18 NOTE — Telephone Encounter (Signed)
Call received from [facility] with [place].  She is with patient and want to report that she feels his breathing has worsen for last sh talked with [name]. She states that today he has course rhonchi rt upper lobe, congested cough that takes his breath, no fever.  She states that his O2 sat was 90%  She gave him his breathing treatment and it climbed to 92% She states that the patients daughter reports that his cardiologist increased his lasix from 40mg  daily to 80mg  2x a day. Patients weight today 192.7lbs.  [name] RN states that she feels his breathing is not an issue of CHF but worsening of COPD. [name] RN is suggesting prednisone and antibiotic before symptoms worsen. She is requesting call back.  She can be reached at [phone].   Reason for Disposition . [1] Follow-up call to recent contact AND [2] information only call, no triage required  Protocols used: INFORMATION ONLY CALL - NO TRIAGE-A-AH

## 2019-06-25 ENCOUNTER — Telehealth: Payer: Self-pay

## 2019-06-25 DIAGNOSIS — R0989 Other specified symptoms and signs involving the circulatory and respiratory systems: Secondary | ICD-10-CM

## 2019-06-25 DIAGNOSIS — R059 Cough, unspecified: Secondary | ICD-10-CM

## 2019-06-25 DIAGNOSIS — R05 Cough: Secondary | ICD-10-CM

## 2019-06-25 MED ORDER — DOXYCYCLINE HYCLATE 100 MG PO TABS: 100.0000 mg | ORAL_TABLET | Freq: Two times a day (BID) | ORAL | 0 refills | Status: DC

## 2019-06-25 NOTE — Telephone Encounter (Signed)
Resent doxycycline to the pharmacy.

## 2019-06-25 NOTE — Telephone Encounter (Signed)
FYI-It does not look like this has been triaged   Copied from [place] [phone]. Topic: General - Inquiry >> Jun 12, 2019  3:22 PM Johnathan [name], [place] wrote: Reason for CRM: [name] with [facility] called in stating she has recently spoken with patient and the patient expressed to her that he feels his breathing is getting worse. Patient now is complaining of being more short of breathe, feeling more chest congestion, and the inability to cough. Patient has been using his nebulizer 3x daily but [name] has advised him to increase this. RN also suggested that patient take plain robitussin to help lossen mucus and to also wear his oxygen. Please advise as these are all new to patient. Call back number for RN is [phone]. >> Jun 25, 2019 11:18 AM [name] wrote: Reason for CRM: [place] [phone] states pharmacy never received   doxycycline (VIBRA-TABS) 100 MG tablet

## 2019-06-25 NOTE — Telephone Encounter (Signed)
Please call in doxycycline for 5 days.  100 mg twice a day.  Agree with other plan from home health.

## 2019-06-25 NOTE — Telephone Encounter (Signed)
Johnathan [name] was advised.

## 2019-06-26 ENCOUNTER — Telehealth: Payer: Self-pay | Admitting: Family Medicine

## 2019-06-26 NOTE — Telephone Encounter (Signed)
Attempted to schedule AWV. Unable to LVM.  Will try at later time. No voicemail setup 

## 2019-07-26 ENCOUNTER — Ambulatory Visit: Payer: Self-pay | Admitting: Family Medicine

## 2019-07-27 NOTE — Progress Notes (Signed)
Established patient visit   Patient: Johnathan [name]   DOB: Jul 04, 1925   86 y.o. Male  MRN: [id number] Visit Date: 08/01/2019  Today's healthcare provider: Wilhemena Durie, MD   No chief complaint on file.  Subjective    HPI Patient now on Entresto for CHF.  He is taking his other medications as directed.  His breathing is okay recently.  They are following his weights at home. Hypertension, follow-up  BP Readings from Last 3 Encounters:  03/28/19 107/67  12/26/18 105/68  12/11/18 122/66   Wt Readings from Last 3 Encounters:  03/28/19 200 lb 6.4 oz (90.9 kg)  12/26/18 203 lb (92.1 kg)  12/11/18 211 lb (95.7 kg)     He was last seen for hypertension 4 months ago.  BP at that visit was 107/67. Management since that visit includes; controlled. He reports excellent compliance with treatment. He is not having side effects.  He is not exercising. He is adherent to low salt diet.   Outside blood pressures are not being checked.  He does not smoke.  He quit smoking a couple of decades ago.  Use of agents associated with hypertension: none.   ---------------------------------------------------------------------------------------------------   Patient Active Problem List   Diagnosis Date Noted  . HFrEF (heart failure with reduced ejection fraction) ([place]) 11/10/2018  . Flutter-fibrillation ([place]) 10/17/2018  . Acute calculous cholecystitis 08/31/2017  . Chronic kidney disease (CKD), stage III (moderate) 08/27/2017  . Pneumonia 08/25/2017  . Acute respiratory failure ([place]) 07/23/2017  . Asthma exacerbation 03/11/2017  . Vertigo 06/01/2016  . Dizziness 05/23/2016  . Renal artery stenosis ([place]) 12/29/2015  . Aneurysm of thoracic aorta ([place]) 12/29/2015  . Superior mesenteric artery stenosis ([place]) 12/29/2015  . Hydrocele, bilateral 09/09/2014  . Hydrocele sac 08/22/2014  . History of prostate cancer 08/22/2014  . ASCVD (arteriosclerotic cardiovascular disease)  08/06/2014  . Cardiomyopathy, ischemic 08/06/2014  . Prostate cancer ([place]) 08/06/2014  . Obesity 08/06/2014  . Insomnia 08/06/2014  . Neuropathy 08/06/2014  . Abdominal aortic aneurysm ([place]) 08/06/2014  . COPD (chronic obstructive pulmonary disease) ([place]) 08/06/2014  . Hyperglycemia 08/06/2014  . Acid reflux 08/05/2014  . Age-related macular degeneration 08/05/2014  . Branch retinal vein occlusion 08/05/2014  . 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 Hypercholesteremia 08/05/2014  . Pseudoaphakia 08/05/2014  . Secondary glaucoma 08/05/2014  . Osteoarthritis of knee 08/05/2014  . Carotid stenosis 04/02/2014  . PAD (peripheral artery disease) ([place]) 04/02/2014  . 3-vessel CAD 11/06/2013  . Colonic stricture ([place]) 02/21/2013  . BP (high blood pressure) 11/22/2012  . BK (bullous keratopathy) 06/28/2012   Past Medical History:  Diagnosis Date  . Abdominal pain    INTERMITTENT  . ARMD (age related macular degeneration)   . Arthritis    hands  . Asthma   . Atherosclerosis of native arteries of the extremities, unspecified   . CAD (coronary artery disease)    3 vessel/ DR FATH CARDIOLOGIST GAVE CLEARANCE  . Colitis    HX OF  . Constipation   . COPD (chronic obstructive pulmonary disease) ([place])   . GERD (gastroesophageal reflux disease)   . Glaucoma    both eyes/ PATIENT CAN NOT SEE  . HOH (hard of hearing)   . Hyperlipidemia   . Hypertension   . Neuromuscular disorder (HCC)    numbness in feet  . Occlusion and stenosis of carotid artery without mention of cerebral infarction   . Prostate cancer ([place])   . Prostate pain   . Shortness  of breath dyspnea   . TIA (transient ischemic attack)    hx of/ 10 yrs ago  . Vertigo   . Wears dentures    upper and lower   Social History   Tobacco Use  . Smoking status: Former Smoker    Packs/day: 1.00    Years: 30.00    Pack years: 30.00    Types: Cigarettes    Quit date: 02/16/2003    Years since quitting: 16.4  . Smokeless  tobacco: Never Used  . Tobacco comment: is exposed to second hand smoke  Vaping Use  . Vaping Use: Never used  Substance Use Topics  . Alcohol use: No  . Drug use: No   Allergies  Allergen Reactions  . Sulfa Antibiotics Other (See Comments)    rash  . Penicillins Rash    Has patient had a PCN reaction causing immediate rash, facial/tongue/throat swelling, SOB or lightheadedness with hypotension: No Has patient had a PCN reaction causing severe rash involving mucus membranes or skin necrosis: No Has patient had a PCN reaction that required hospitalization: No Has patient had a PCN reaction occurring within the last 10 years: No If all of the above answers are "NO", then may proceed with Cephalosporin use.      Medications: Outpatient Medications Prior to Visit  Medication Sig  . ADVAIR DISKUS 500-50 MCG/DOSE AEPB Inhale 1 puff into the lungs 2 (two) times daily.  [name] amLODipine (NORVASC) 5 MG tablet TAKE 1 TABLET DAILY  . aspirin (ASPIRIN LOW DOSE) 81 MG EC tablet Take 81 mg by mouth daily. Swallow whole.  [name] aspirin 325 MG EC tablet Take 325 mg by mouth daily.  [name] esomeprazole (NEXIUM) 20 MG capsule Take 1 capsule (20 mg total) by mouth 2 (two) times daily.  . furosemide (LASIX) 40 MG tablet Take 1 tablet (40 mg total) by mouth daily.  [name] gabapentin (NEURONTIN) 100 MG capsule   . INCRUSE ELLIPTA 62.5 MCG/INH AEPB Inhale 1 puff into the lungs daily.  [name] ipratropium-albuterol (DUONEB) 0.5-2.5 (3) MG/3ML SOLN USE 1 VIAL IN NEBULIZER EVERY 4 HOURS - and as needed  . prednisoLONE acetate (PRED FORTE) 1 % ophthalmic suspension Place 1 drop into the left eye daily.   . rosuvastatin (CRESTOR) 20 MG tablet Take by mouth.  . sacubitril-valsartan (ENTRESTO) 24-26 MG Take by mouth.  [name] albuterol (ACCUNEB) 0.63 MG/3ML nebulizer solution USE 1 VIAL BY NEBULIZER EVERY 4 HOURS ASNEEDED FOR WHEEZING  . albuterol (PROVENTIL HFA;VENTOLIN HFA) 108 (90 Base) MCG/ACT inhaler Inhale 2 puffs into the lungs  every 6 (six) hours as needed for wheezing or shortness of breath. (Patient not taking: Reported on 03/28/2019)  . 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 doxycycline (VIBRA-TABS) 100 MG tablet Take 1 tablet (100 mg total) by mouth 2 (two) times daily.  [name] latanoprost (XALATAN) 0.005 % ophthalmic solution Place 1 drop into both eyes at bedtime.   . meloxicam (MOBIC) 15 MG tablet   . Polyethylene Glycol 3350 (MIRALAX PO) Take by mouth as needed.  . predniSONE (DELTASONE) 10 MG tablet Take 6 tablets day 1, 5 tablets day 2, 4 tablets day 3, 3 tablets day 4, 2 tablets day 5, 1 tablet day 6   No facility-administered medications prior to visit.    Review of Systems  Constitutional: Negative.  Negative for appetite change, chills and fever.  HENT: Negative.   Eyes: Positive for visual disturbance.  Respiratory: Positive for shortness of breath. Negative for  apnea, cough, choking, chest tightness, wheezing and stridor.   Cardiovascular: Negative.  Negative for chest pain and palpitations.  Gastrointestinal: Positive for constipation. Negative for abdominal distention, abdominal pain, anal bleeding, blood in stool, diarrhea, nausea, rectal pain and vomiting.  Endocrine: Negative.   Allergic/Immunologic: Negative.   Neurological: Negative for dizziness, light-headedness and headaches.  Hematological: Negative.   Psychiatric/Behavioral: Negative.       Objective    There were no vitals taken for this visit. BP Readings from Last 3 Encounters:  03/28/19 107/67  12/26/18 105/68  12/11/18 122/66      Physical Exam Vitals reviewed.  Constitutional:      Appearance: He is well-developed.  HENT:     Head: Normocephalic and atraumatic.     Right Ear: External ear normal.     Left Ear: External ear normal.     Nose: Nose normal.  Eyes:     General: No scleral icterus.    Conjunctiva/sclera: Conjunctivae normal.  Neck:     Thyroid: No thyromegaly.    Cardiovascular:     Rate and Rhythm: Normal rate and regular rhythm.     Heart sounds: Normal heart sounds.  Pulmonary:     Effort: Pulmonary effort is normal.     Breath sounds: Normal breath sounds.     Comments: Decreased breath sounds in left base. Abdominal:     Palpations: Abdomen is soft.  Musculoskeletal:     Comments: 1+edema  Skin:    General: Skin is warm and dry.  Neurological:     General: No focal deficit present.     Mental Status: He is alert and oriented to person, place, and time.  Psychiatric:        Mood and Affect: Mood normal.        Behavior: Behavior normal.        Thought Content: Thought content normal.        Judgment: Judgment normal.       No results found for any visits on 08/01/19.  Assessment & Plan     1. Hypercholesteremia On Crestor  2. Essential hypertension Watch for  possible hypotension now patient is on Entresto  3. Centrilobular emphysema (HCC) On Advair  4. HFrEF (heart failure with reduced ejection fraction) ([place]) Now on Entresto.  Clinically I think that this should help.  5. Flutter-fibrillation ([place])   6. Cardiomyopathy, ischemic   7. Prostate cancer ([place])   8. Age-related macular degeneration    No follow-ups on file.      I, Wilhemena Durie, MD, have reviewed all documentation for this visit. The documentation on 08/05/19 for the exam, diagnosis, procedures, and orders are all accurate and complete.    Richard Cranford Mon, MD  [facility] [phone] (phone) [phone] (fax)  [place]

## 2019-08-01 ENCOUNTER — Ambulatory Visit (INDEPENDENT_AMBULATORY_CARE_PROVIDER_SITE_OTHER): Payer: Medicare Other | Admitting: Family Medicine

## 2019-08-01 ENCOUNTER — Other Ambulatory Visit: Payer: Self-pay

## 2019-08-01 DIAGNOSIS — H3530 Unspecified macular degeneration: Secondary | ICD-10-CM

## 2019-08-01 DIAGNOSIS — J432 Centrilobular emphysema: Secondary | ICD-10-CM

## 2019-08-01 DIAGNOSIS — I4892 Unspecified atrial flutter: Secondary | ICD-10-CM

## 2019-08-01 DIAGNOSIS — E7800 Pure hypercholesterolemia, unspecified: Secondary | ICD-10-CM | POA: Diagnosis not present

## 2019-08-01 DIAGNOSIS — I10 Essential (primary) hypertension: Secondary | ICD-10-CM | POA: Diagnosis not present

## 2019-08-01 DIAGNOSIS — IMO0002 Reserved for concepts with insufficient information to code with codable children: Secondary | ICD-10-CM

## 2019-08-01 DIAGNOSIS — I5020 Unspecified systolic (congestive) heart failure: Secondary | ICD-10-CM

## 2019-08-01 DIAGNOSIS — C61 Malignant neoplasm of prostate: Secondary | ICD-10-CM

## 2019-08-01 DIAGNOSIS — I4891 Unspecified atrial fibrillation: Secondary | ICD-10-CM

## 2019-08-01 DIAGNOSIS — I255 Ischemic cardiomyopathy: Secondary | ICD-10-CM

## 2019-10-12 ENCOUNTER — Other Ambulatory Visit: Payer: Self-pay | Admitting: Family Medicine

## 2019-10-30 NOTE — Progress Notes (Signed)
Trena Platt Howard Patton,acting as a scribe for Wilhemena Durie, MD.,have documented all relevant documentation on the behalf of Wilhemena Durie, MD,as directed by  Wilhemena Durie, MD while in the presence of Wilhemena Durie, MD.  Established patient visit   Patient: Johnathan [name]   DOB: [birth year]   86 y.o. Male  MRN: [id number] Visit Date: 10/31/2019  Today's healthcare provider: Wilhemena Durie, MD   Chief Complaint  Patient presents with   Hyperlipidemia   Hypertension   Subjective    HPI  Overall patient is stable and has no new complaints.  He is breathing fine presently.  No chest pain. Weight is good and he is taking Entresto presently. Lipid/Cholesterol, Follow-up  Last lipid panel Other pertinent labs  Lab Results  Component Value Date   CHOL 102 03/28/2019   HDL 39 (L) 03/28/2019   LDLCALC 51 03/28/2019   TRIG 51 03/28/2019   CHOLHDL 2.6 03/28/2019   Lab Results  Component Value Date   ALT 6 03/28/2019   AST 15 03/28/2019   PLT 196 03/28/2019   TSH 2.180 03/28/2019     He was last seen for this 3 months ago.  Management since that visit includes no changes, on crestor.  He reports excellent compliance with treatment. He is not having side effects.    Current diet: regular Current exercise: none  The ASCVD Risk score ([place]., et al., 2013) failed to calculate for the following reasons:   The 2013 ASCVD risk score is only valid for ages 99 to 33  ---------------------------------------------------------------------------------------------------   Social History   Tobacco Use   Smoking status: Former Smoker    Packs/day: 1.00    Years: 30.00    Pack years: 30.00    Types: Cigarettes    Quit date: 02/16/2003    Years since quitting: 16.7   Smokeless tobacco: Never Used   Tobacco comment: is exposed to second hand smoke  Vaping Use   Vaping Use: Never used  Substance Use Topics   Alcohol use: No   Drug use: No         Medications: Outpatient Medications Prior to Visit  Medication Sig   ADVAIR DISKUS 500-50 MCG/DOSE AEPB Inhale 1 puff into the lungs 2 (two) times daily.   albuterol (ACCUNEB) 0.63 MG/3ML nebulizer solution USE 1 VIAL BY NEBULIZER EVERY 4 HOURS ASNEEDED FOR WHEEZING   albuterol (PROVENTIL HFA;VENTOLIN HFA) 108 (90 Base) MCG/ACT inhaler Inhale 2 puffs into the lungs every 6 (six) hours as needed for wheezing or shortness of breath.   AMBULATORY NON FORMULARY MEDICATION Medication Name: Flutter valve DX:J44.9   amLODipine (NORVASC) 5 MG tablet TAKE 1 TABLET DAILY   aspirin (ASPIRIN LOW DOSE) 81 MG EC tablet Take 81 mg by mouth daily. Swallow whole.   aspirin 325 MG EC tablet Take 325 mg by mouth daily.   doxycycline (VIBRA-TABS) 100 MG tablet Take 1 tablet (100 mg total) by mouth 2 (two) times daily.   esomeprazole (NEXIUM) 20 MG capsule Take 1 capsule (20 mg total) by mouth 2 (two) times daily.   furosemide (LASIX) 40 MG tablet TAKE 1 TABLET DAILY   gabapentin (NEURONTIN) 100 MG capsule    INCRUSE ELLIPTA 62.5 MCG/INH AEPB Inhale 1 puff into the lungs daily.   ipratropium-albuterol (DUONEB) 0.5-2.5 (3) MG/3ML SOLN USE 1 VIAL IN NEBULIZER EVERY 4 HOURS - and as needed   latanoprost (XALATAN) 0.005 % ophthalmic solution Place 1 drop into both  eyes at bedtime.    meloxicam (MOBIC) 15 MG tablet    Polyethylene Glycol 3350 (MIRALAX PO) Take by mouth as needed.   prednisoLONE acetate (PRED FORTE) 1 % ophthalmic suspension Place 1 drop into the left eye daily.    predniSONE (DELTASONE) 10 MG tablet Take 6 tablets day 1, 5 tablets day 2, 4 tablets day 3, 3 tablets day 4, 2 tablets day 5, 1 tablet day 6   rosuvastatin (CRESTOR) 20 MG tablet Take by mouth.   sacubitril-valsartan (ENTRESTO) 24-26 MG Take by mouth.   No facility-administered medications prior to visit.    Review of Systems  Last metabolic panel Lab Results  Component Value Date   GLUCOSE 95 10/31/2019    NA 141 10/31/2019   K 5.2 10/31/2019   CL 104 10/31/2019   CO2 23 10/31/2019   BUN 30 10/31/2019   CREATININE 1.30 (H) 10/31/2019   GFRNONAA 47 (L) 10/31/2019   GFRAA 55 (L) 10/31/2019   CALCIUM 8.7 10/31/2019   PHOS 3.9 10/31/2019   PROT 6.2 03/28/2019   ALBUMIN 4.1 10/31/2019   LABGLOB 2.5 03/28/2019   AGRATIO 1.5 03/28/2019   BILITOT 0.6 03/28/2019   ALKPHOS 81 03/28/2019   AST 15 03/28/2019   ALT 6 03/28/2019   ANIONGAP 8 04/12/2018      Objective    BP (!) 145/66 (BP Location: Left Arm, Patient Position: Sitting, Cuff Size: Normal)    Pulse 74    Temp 98.3 F (36.8 C) (Oral)    Ht 5\' 8"  (1.727 m)    Wt 190 lb (86.2 kg)    BMI 28.89 kg/m  BP Readings from Last 3 Encounters:  10/31/19 (!) 145/66  03/28/19 107/67  12/26/18 105/68   Wt Readings from Last 3 Encounters:  10/31/19 190 lb (86.2 kg)  03/28/19 200 lb 6.4 oz (90.9 kg)  12/26/18 203 lb (92.1 kg)      Physical Exam Vitals reviewed.  Constitutional:      Appearance: He is well-developed.  HENT:     Head: Normocephalic and atraumatic.     Right Ear: External ear normal.     Left Ear: External ear normal.     Nose: Nose normal.  Eyes:     General: No scleral icterus.    Conjunctiva/sclera: Conjunctivae normal.  Neck:     Thyroid: No thyromegaly.  Cardiovascular:     Rate and Rhythm: Normal rate and regular rhythm.     Heart sounds: Normal heart sounds.  Pulmonary:     Effort: Pulmonary effort is normal.     Breath sounds: Normal breath sounds.     Comments: Decreased breath sounds in left base. Abdominal:     Palpations: Abdomen is soft.  Musculoskeletal:     Comments: 1+edema  Skin:    General: Skin is warm and dry.  Neurological:     General: No focal deficit present.     Mental Status: He is alert and oriented to person, place, and time.  Psychiatric:        Mood and Affect: Mood normal.        Behavior: Behavior normal.        Thought Content: Thought content normal.        Judgment:  Judgment normal.       No results found for any visits on 10/31/19.  Assessment & Plan     1. Chronic systolic congestive heart failure ([place]) On Entresto.  Follow-up in January 2022 - CBC with  Differential/Platelet - Renal Function Panel  2. Essential hypertension Blood pressure acceptable on Entresto and other meds - CBC with Differential/Platelet - Renal Function Panel  3. Hypercholesteremia On Crestor - CBC with Differential/Platelet - Renal Function Panel  4. Stage 3b chronic kidney disease  - CBC with Differential/Platelet - Renal Function Panel  5. Chronic obstructive pulmonary disease with acute exacerbation ([place]) Patient has quit smoking - CBC with Differential/Platelet - Renal Function Panel  6. Flu vaccine need  - Flu Vaccine QUAD High Dose([place])  7. Cardiomyopathy, ischemic   8. 3-vessel CAD All risk factors treated.   Return in about 4 months (around 03/01/2020).      I, Wilhemena Durie, MD, have reviewed all documentation for this visit. The documentation on 11/05/19 for the exam, diagnosis, procedures, and orders are all accurate and complete.    Richard Cranford Mon, MD  [facility] [phone] (phone) [phone] (fax)  [place]

## 2019-10-31 ENCOUNTER — Encounter: Payer: Self-pay | Admitting: Family Medicine

## 2019-10-31 ENCOUNTER — Other Ambulatory Visit: Payer: Self-pay

## 2019-10-31 ENCOUNTER — Ambulatory Visit (INDEPENDENT_AMBULATORY_CARE_PROVIDER_SITE_OTHER): Payer: Medicare Other | Admitting: Family Medicine

## 2019-10-31 VITALS — BP 145/66 | HR 74 | Temp 98.3°F | Ht 68.0 in | Wt 190.0 lb

## 2019-10-31 DIAGNOSIS — N1832 Chronic kidney disease, stage 3b: Secondary | ICD-10-CM

## 2019-10-31 DIAGNOSIS — I5022 Chronic systolic (congestive) heart failure: Secondary | ICD-10-CM

## 2019-10-31 DIAGNOSIS — E7800 Pure hypercholesterolemia, unspecified: Secondary | ICD-10-CM

## 2019-10-31 DIAGNOSIS — I10 Essential (primary) hypertension: Secondary | ICD-10-CM | POA: Diagnosis not present

## 2019-10-31 DIAGNOSIS — I255 Ischemic cardiomyopathy: Secondary | ICD-10-CM

## 2019-10-31 DIAGNOSIS — J441 Chronic obstructive pulmonary disease with (acute) exacerbation: Secondary | ICD-10-CM

## 2019-10-31 DIAGNOSIS — I2510 Atherosclerotic heart disease of native coronary artery without angina pectoris: Secondary | ICD-10-CM

## 2019-10-31 DIAGNOSIS — Z23 Encounter for immunization: Secondary | ICD-10-CM | POA: Diagnosis not present

## 2019-11-01 LAB — RENAL FUNCTION PANEL
Albumin: 4.1 g/dL (ref 3.5–4.6)
BUN/Creatinine Ratio: 23 (ref 10–24)
BUN: 30 mg/dL (ref 10–36)
CO2: 23 mmol/L (ref 20–29)
Calcium: 8.7 mg/dL (ref 8.6–10.2)
Chloride: 104 mmol/L (ref 96–106)
Creatinine, Ser: 1.3 mg/dL — ABNORMAL HIGH (ref 0.76–1.27)
GFR calc Af Amer: 55 mL/min/{1.73_m2} — ABNORMAL LOW (ref >59)
GFR calc non Af Amer: 47 mL/min/{1.73_m2} — ABNORMAL LOW (ref >59)
Glucose: 95 mg/dL (ref 65–99)
Phosphorus: 3.9 mg/dL (ref 2.8–4.1)
Potassium: 5.2 mmol/L (ref 3.5–5.2)
Sodium: 141 mmol/L (ref 134–144)

## 2019-11-01 LAB — CBC WITH DIFFERENTIAL/PLATELET
Basophils Absolute: 0 10*3/uL (ref 0.0–0.2)
Basos: 1 %
EOS (ABSOLUTE): 0.1 10*3/uL (ref 0.0–0.4)
Eos: 1 %
Hematocrit: 40.1 % (ref 37.5–51.0)
Hemoglobin: 12.9 g/dL — ABNORMAL LOW (ref 13.0–17.7)
Immature Grans (Abs): 0 10*3/uL (ref 0.0–0.1)
Immature Granulocytes: 0 %
Lymphocytes Absolute: 1.1 10*3/uL (ref 0.7–3.1)
Lymphs: 19 %
MCH: 29.9 pg (ref 26.6–33.0)
MCHC: 32.2 g/dL (ref 31.5–35.7)
MCV: 93 fL (ref 79–97)
Monocytes Absolute: 0.5 10*3/uL (ref 0.1–0.9)
Monocytes: 8 %
Neutrophils Absolute: 4.4 10*3/uL (ref 1.4–7.0)
Neutrophils: 71 %
Platelets: 189 10*3/uL (ref 150–450)
RBC: 4.32 x10E6/uL (ref 4.14–5.80)
RDW: 13 % (ref 11.6–15.4)
WBC: 6.1 10*3/uL (ref 3.4–10.8)

## 2019-11-02 ENCOUNTER — Telehealth: Payer: Self-pay

## 2019-11-02 NOTE — Telephone Encounter (Signed)
-----   Message from Jerrol Banana., MD sent at 11/01/2019  4:37 PM EDT ----- Labs stable

## 2019-11-02 NOTE — Telephone Encounter (Signed)
Called to advise patient of lab results, unable to leave voicemail. If patient calls back it is okay for pec to advise.

## 2019-11-05 NOTE — Telephone Encounter (Signed)
Pt given lab results per notes of Dr. Rosanna Randy on 11/01/19. Pt verbalized understanding.

## 2019-11-06 NOTE — Progress Notes (Signed)
Subjective:   Johnathan [name] is a 86 y.o. male who presents for Medicare Annual/Subsequent preventive examination.  I connected with Johnathan [name] today by telephone and verified that I am speaking with the correct person using two identifiers. Location patient: home Location provider: work Persons participating in the virtual visit: patient, provider.   I discussed the limitations, risks, security and privacy concerns of performing an evaluation and management service by telephone and the availability of in person appointments. I also discussed with the patient that there may be a patient responsible charge related to this service. The patient expressed understanding and verbally consented to this telephonic visit.    Interactive audio and video telecommunications were attempted between this provider and patient, however failed, due to patient having technical difficulties OR patient did not have access to video capability.  We continued and completed visit with audio only.   Review of Systems    N/A  Cardiac Risk Factors include: advanced age (>71men, >60 women);dyslipidemia;male gender;hypertension     Objective:    There were no vitals filed for this visit. There is no height or weight on file to calculate BMI.  Advanced Directives 11/07/2019 04/12/2018 07/24/2017 07/23/2017 04/05/2017 03/11/2017 03/11/2017  Does Patient Have a Medical Advance Directive? Yes No - No No Yes No  Type of Paramedic of [place];Living will - - - - Living will -  Does patient want to make changes to medical advance directive? - - - - - No - Patient declined -  Copy of [place] in Chart? No - copy requested - - - - - -  Would patient like information on creating a medical advance directive? No - Patient declined No - Patient declined (No Data) Yes (Inpatient - patient requests chaplain consult to create a medical advance directive) Yes (MAU/Ambulatory/Procedural Areas -  Information given) No - Patient declined No - Patient declined    Current Medications (verified) Outpatient Encounter Medications as of 11/07/2019  Medication Sig  . 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 aspirin (ASPIRIN LOW DOSE) 81 MG EC tablet Take 81 mg by mouth daily. Swallow whole.  . esomeprazole (NEXIUM) 20 MG capsule Take 1 capsule (20 mg total) by mouth 2 (two) times daily.  . furosemide (LASIX) 40 MG tablet TAKE 1 TABLET DAILY  . latanoprost (XALATAN) 0.005 % ophthalmic solution Place 1 drop into both eyes at bedtime.   . prednisoLONE acetate (PRED FORTE) 1 % ophthalmic suspension Place 1 drop into the left eye daily.   . rosuvastatin (CRESTOR) 20 MG tablet Take 20 mg by mouth daily.   [name] ADVAIR DISKUS 500-50 MCG/DOSE AEPB Inhale 1 puff into the lungs 2 (two) times daily.  [name] albuterol (ACCUNEB) 0.63 MG/3ML nebulizer solution USE 1 VIAL BY NEBULIZER EVERY 4 HOURS ASNEEDED FOR WHEEZING  . albuterol (PROVENTIL HFA;VENTOLIN HFA) 108 (90 Base) MCG/ACT inhaler Inhale 2 puffs into the lungs every 6 (six) hours as needed for wheezing or shortness of breath.  [name] amLODipine (NORVASC) 5 MG tablet TAKE 1 TABLET DAILY  . aspirin 325 MG EC tablet Take 325 mg by mouth daily. (Patient not taking: Reported on 11/07/2019)  . doxycycline (VIBRA-TABS) 100 MG tablet Take 1 tablet (100 mg total) by mouth 2 (two) times daily. (Patient not taking: Reported on 11/07/2019)  . gabapentin (NEURONTIN) 100 MG capsule   . INCRUSE ELLIPTA 62.5 MCG/INH AEPB Inhale 1 puff into the lungs daily.  [name] ipratropium-albuterol (DUONEB)  0.5-2.5 (3) MG/3ML SOLN USE 1 VIAL IN NEBULIZER EVERY 4 HOURS - and as needed  . meloxicam (MOBIC) 15 MG tablet   . Polyethylene Glycol 3350 (MIRALAX PO) Take by mouth as needed.  . predniSONE (DELTASONE) 10 MG tablet Take 6 tablets day 1, 5 tablets day 2, 4 tablets day 3, 3 tablets day 4, 2 tablets day 5, 1 tablet day 6 (Patient not taking: Reported on  11/07/2019)  . sacubitril-valsartan (ENTRESTO) 24-26 MG Take by mouth.   No facility-administered encounter medications on file as of 11/07/2019.    Allergies (verified) Sulfa antibiotics and Penicillins   History: Past Medical History:  Diagnosis Date  . Abdominal pain    INTERMITTENT  . ARMD (age related macular degeneration)   . Arthritis    hands  . Asthma   . Atherosclerosis of native arteries of the extremities, unspecified   . CAD (coronary artery disease)    3 vessel/ DR FATH CARDIOLOGIST GAVE CLEARANCE  . Colitis    HX OF  . Constipation   . COPD (chronic obstructive pulmonary disease) ([place])   . GERD (gastroesophageal reflux disease)   . Glaucoma    both eyes/ PATIENT CAN NOT SEE  . HOH (hard of hearing)   . Hyperlipidemia   . Hypertension   . Neuromuscular disorder (HCC)    numbness in feet  . Occlusion and stenosis of carotid artery without mention of cerebral infarction   . Prostate cancer ([place])   . Prostate pain   . Shortness of breath dyspnea   . TIA (transient ischemic attack)    hx of/ 10 yrs ago  . Vertigo   . Wears dentures    upper and lower   Past Surgical History:  Procedure Laterality Date  . aortoiliac bypass   1986  . CAROTID ENDARTERECTOMY Right 2003  . CATARACT EXTRACTION    . COLONOSCOPY  02-06-13   Dr Candace Cruise  . COLONOSCOPY WITH PROPOFOL N/A 07/09/2015   Procedure: COLONOSCOPY WITH PROPOFOL;  Surgeon: Hulen Luster, MD;  Location: [place];  Service: Gastroenterology;  Laterality: N/A;  . CORONARY ARTERY BYPASS GRAFT  2000   x3  . HERNIA REPAIR  1992  . PROSTATE SURGERY  2001   Family History  Problem Relation Age of Onset  . Cirrhosis Mother        liver  . Heart attack Father   . Prostate cancer Paternal Uncle   . Kidney disease Neg Hx   . Kidney cancer Neg Hx   . Bladder Cancer Neg Hx    Social History   Socioeconomic History  . Marital status: Widowed    Spouse name: Not on file  . Number of children: 4  . Years  of education: Not on file  . Highest education level: High school graduate  Occupational History  . Occupation: retired  Tobacco Use  . Smoking status: Former Smoker    Packs/day: 1.00    Years: 30.00    Pack years: 30.00    Types: Cigarettes    Quit date: 02/16/2003    Years since quitting: 16.7  . Smokeless tobacco: Never Used  . Tobacco comment: is exposed to second hand smoke  Vaping Use  . Vaping Use: Never used  Substance and Sexual Activity  . Alcohol use: No  . Drug use: No  . Sexual activity: Not Currently  Other Topics Concern  . Not on file  Social History Narrative  . Not on file   Social  Determinants of Health   Financial Resource Strain: Low Risk   . Difficulty of Paying Living Expenses: Not hard at all  Food Insecurity: No Food Insecurity  . Worried About Charity fundraiser in the Last Year: Never true  . Ran Out of Food in the Last Year: Never true  Transportation Needs: No Transportation Needs  . Lack of Transportation (Medical): No  . Lack of Transportation (Non-Medical): No  Physical Activity: Inactive  . Days of Exercise per Week: 0 days  . Minutes of Exercise per Session: 0 min  Stress: No Stress Concern Present  . Feeling of Stress : Not at all  Social Connections: Socially Isolated  . Frequency of Communication with Friends and Family: More than three times a week  . Frequency of Social Gatherings with Friends and Family: More than three times a week  . Attends Religious Services: Never  . Active Member of Clubs or Organizations: No  . Attends Archivist Meetings: Never  . Marital Status: Widowed    Tobacco Counseling Counseling given: Not Answered Comment: is exposed to second hand smoke   Clinical Intake:  Pre-visit preparation completed: Yes  Pain : No/denies pain     Nutritional Risks: None Diabetes: No  How often do you need to have someone help you when you read instructions, pamphlets, or other written materials  from your doctor or pharmacy?: 1 - Never  Diabetic? No  Interpreter Needed?: No  Information entered by :: [place], LPN   Activities of Daily Living In your present state of health, do you have any difficulty performing the following activities: 11/07/2019 10/31/2019  Hearing? Y Y  Comment Does not wear hearing aids. -  Vision? Y Y  Comment Is blind in the left eye. -  Difficulty concentrating or making decisions? - N  Walking or climbing stairs? Y Y  Comment Avoids steps. -  Dressing or bathing? N N  Doing errands, shopping? Y Y  Comment Does not drive. -  Preparing Food and eating ? N -  Using the Toilet? N -  In the past six months, have you accidently leaked urine? N -  Do you have problems with loss of bowel control? N -  Managing your Medications? Y -  Comment Daughter manages medications. -  Managing your Finances? N -  Housekeeping or managing your Housekeeping? N -  Some recent data might be hidden    Patient Care Team: Jerrol Banana., MD as PCP - General (Family Medicine) Sharlotte Alamo, DPM as Consulting Physician (Podiatry) Faye Ramsay, FNP as Nurse Practitioner (Nurse Practitioner) Delana Meyer, Dolores Lory, MD as Consulting Physician (Vascular Surgery) Teodoro Spray, MD as Consulting Physician (Cardiology) Carolynn Serve, MD as Referring Physician (Internal Medicine) Jannifer Franklin, NP as Nurse Practitioner (Neurology) Hunsucker, Bonna Gains, MD as Consulting Physician (Internal Medicine) [place], [place] (Optometry)  Indicate any recent Medical Services you may have received from other than [facility] providers in the past year (date may be approximate).     Assessment:   This is a routine wellness examination for Suraj.  Hearing/Vision screen No exam data present  Dietary issues and exercise activities discussed: Current Exercise Habits: The patient does not participate in regular exercise at present, Exercise limited by: respiratory  conditions(s)  Goals    . Increase water intake     Starting 04/15/16, I will increase my water intake to 4-5 glasses a day.    . Prevent falls  Recommend to remove any items from the home that may cause slips or trips.      Depression Screen PHQ 2/9 Scores 10/31/2019 12/26/2018 04/05/2017 04/15/2016 01/08/2015 08/07/2014  PHQ - 2 Score 3 0 3 0 0 0  PHQ- 9 Score 9 2 9  - - -    Fall Risk Fall Risk  11/07/2019 10/31/2019 12/26/2018 04/05/2017 04/15/2016  Falls in the past year? 1 1 1  No No  Number falls in past yr: 1 0 1 - -  Injury with Fall? 0 1 0 - -  Risk for fall due to : - - - Impaired vision -  Follow up Falls prevention discussed Falls evaluation completed Falls evaluation completed - -    Any stairs in or around the home? No  If so, are there any without handrails? No  Home free of loose throw rugs in walkways, pet beds, electrical cords, etc? Yes  Adequate lighting in your home to reduce risk of falls? Yes   ASSISTIVE DEVICES UTILIZED TO PREVENT FALLS:  Life alert? No  Use of a cane, walker or w/c? No  Grab bars in the bathroom? Yes  Shower chair or bench in shower? Yes  Elevated toilet seat or a handicapped toilet? No    Cognitive Function:     6CIT Screen 11/07/2019 04/15/2016  What Year? 4 points 0 points  What month? 0 points 0 points  What time? 3 points 0 points  Count back from 20 0 points 0 points  Months in reverse 0 points 0 points  Repeat phrase 0 points 0 points  Total Score 7 0    Immunizations Immunization History  Administered Date(s) Administered  . Fluad Quad(high Dose 65+) 10/31/2019  . Influenza, High Dose Seasonal PF 12/05/2014, 11/05/2015, 11/09/2016, 11/09/2017  . Influenza,inj,Quad PF,6+ Mos 11/18/2018  . Pneumococcal Conjugate-13 12/17/2013  . Pneumococcal Polysaccharide-23 12/31/2003, 11/21/2012  . Tdap 04/12/2018    TDAP status: Up to date Flu Vaccine status: Declined, Education has been provided regarding the importance of this  vaccine but patient still declined. Advised may receive this vaccine at local pharmacy or Health Dept. Aware to provide a copy of the vaccination record if obtained from local pharmacy or Health Dept. Verbalized acceptance and understanding. Pneumococcal vaccine status: Up to date Covid-19 vaccine status: Completed vaccines  Qualifies for Shingles Vaccine? Yes   Zostavax completed No   Shingrix Completed?: No.    Education has been provided regarding the importance of this vaccine. Patient has been advised to call insurance company to determine out of pocket expense if they have not yet received this vaccine. Advised may also receive vaccine at local pharmacy or Health Dept. Verbalized acceptance and understanding.  Screening Tests Health Maintenance  Topic Date Due  . COVID-19 Vaccine (1) Never done  . TETANUS/TDAP  04/12/2028  . INFLUENZA VACCINE  Completed  . PNA vac Low Risk Adult  Completed    Health Maintenance  Health Maintenance Due  Topic Date Due  . COVID-19 Vaccine (1) Never done    Colorectal cancer screening: No longer required.   Lung Cancer Screening: (Low Dose CT Chest recommended if Age 20-80 years, 30 pack-year currently smoking OR have quit w/in 15years.) does not qualify.   Additional Screening:  Vision Screening: Recommended annual ophthalmology exams for early detection of glaucoma and other disorders of the eye. Is the patient up to date with their annual eye exam?  Yes  Who is the provider or what is the name of the  office in which the patient attends annual eye exams? [facility] If pt is not established with a provider, would they like to be referred to a provider to establish care? No .   Dental Screening: Recommended annual dental exams for proper oral hygiene  Community Resource Referral / Chronic Care Management: CRR required this visit?  No   CCM required this visit?  No      Plan:     I have personally reviewed and noted the  following in the patient's chart:   . Medical and social history . Use of alcohol, tobacco or illicit drugs  . Current medications and supplements . Functional ability and status . Nutritional status . Physical activity . Advanced directives . List of other physicians . Hospitalizations, surgeries, and ER visits in previous 12 months . Vitals . Screenings to include cognitive, depression, and falls . Referrals and appointments  In addition, I have reviewed and discussed with patient certain preventive protocols, quality metrics, and best practice recommendations. A written personalized care plan for preventive services as well as general preventive health recommendations were provided to patient.     Mihir Flanigan [place], [place]   6/65/9935   Nurse Notes: Pt states he has received the Covid vaccine. Requested he bring in the vaccine card to next apt.

## 2019-11-07 ENCOUNTER — Other Ambulatory Visit: Payer: Self-pay

## 2019-11-07 ENCOUNTER — Ambulatory Visit (INDEPENDENT_AMBULATORY_CARE_PROVIDER_SITE_OTHER): Payer: Medicare Other

## 2019-11-07 DIAGNOSIS — Z0000 Encounter for general adult medical examination without abnormal findings: Secondary | ICD-10-CM | POA: Diagnosis not present

## 2019-11-07 NOTE — Patient Instructions (Signed)
Mr. Johnathan [name] , Thank you for taking time to come for your Medicare Wellness Visit. I appreciate your ongoing commitment to your health goals. Please review the following plan we discussed and let me know if I can assist you in the future.   Screening recommendations/referrals: Colonoscopy: No longer required.  Recommended yearly ophthalmology/optometry visit for glaucoma screening and checkup Recommended yearly dental visit for hygiene and checkup  Vaccinations: Influenza vaccine: Currently due Pneumococcal vaccine: Completed series Tdap vaccine: Up to date, due 20/2030 Shingles vaccine: Shingrix discussed. Please contact your pharmacy for coverage information.     Advanced directives: Please bring a copy of your POA (Power of Attorney) and/or Living Will to your next appointment.   Conditions/risks identified: Fall risk preventatives discussed today. Recommend to increase water intake to 6-8 8 oz glasses a day.  Next appointment: 03/06/19 @ 8:00 AM with [name]. Declined scheduling an AWV for 2022 at this time.  Preventive Care 41 Years and Older, Male Preventive care refers to lifestyle choices and visits with your health care provider that can promote health and wellness. What does preventive care include?  A yearly physical exam. This is also called an annual well check.  Dental exams once or twice a year.  Routine eye exams. Ask your health care provider how often you should have your eyes checked.  Personal lifestyle choices, including:  Daily care of your teeth and gums.  Regular physical activity.  Eating a healthy diet.  Avoiding tobacco and drug use.  Limiting alcohol use.  Practicing safe sex.  Taking low doses of aspirin every day.  Taking vitamin and mineral supplements as recommended by your health care provider. What happens during an annual well check? The services and screenings done by your health care provider during your annual well check will depend  on your age, overall health, lifestyle risk factors, and family history of disease. Counseling  Your health care provider may ask you questions about your:  Alcohol use.  Tobacco use.  Drug use.  Emotional well-being.  Home and relationship well-being.  Sexual activity.  Eating habits.  History of falls.  Memory and ability to understand (cognition).  Work and work Statistician. Screening  You may have the following tests or measurements:  Height, weight, and BMI.  Blood pressure.  Lipid and cholesterol levels. These may be checked every 5 years, or more frequently if you are over 43 years old.  Skin check.  Lung cancer screening. You may have this screening every year starting at age 16 if you have a 30-pack-year history of smoking and currently smoke or have quit within the past 15 years.  Fecal occult blood test (FOBT) of the stool. You may have this test every year starting at age 29.  Flexible sigmoidoscopy or colonoscopy. You may have a sigmoidoscopy every 5 years or a colonoscopy every 10 years starting at age 68.  Prostate cancer screening. Recommendations will vary depending on your family history and other risks.  Hepatitis C blood test.  Hepatitis B blood test.  Sexually transmitted disease (STD) testing.  Diabetes screening. This is done by checking your blood sugar (glucose) after you have not eaten for a while (fasting). You may have this done every 1-3 years.  Abdominal aortic aneurysm (AAA) screening. You may need this if you are a current or former smoker.  Osteoporosis. You may be screened starting at age 54 if you are at high risk. Talk with your health care provider about your test results,  treatment options, and if necessary, the need for more tests. Vaccines  Your health care provider may recommend certain vaccines, such as:  Influenza vaccine. This is recommended every year.  Tetanus, diphtheria, and acellular pertussis (Tdap, Td)  vaccine. You may need a Td booster every 10 years.  Zoster vaccine. You may need this after age 15.  Pneumococcal 13-valent conjugate (PCV13) vaccine. One dose is recommended after age 70.  Pneumococcal polysaccharide (PPSV23) vaccine. One dose is recommended after age 64. Talk to your health care provider about which screenings and vaccines you need and how often you need them. This information is not intended to replace advice given to you by your health care provider. Make sure you discuss any questions you have with your health care provider. Document Released: 02/28/2015 Document Revised: 10/22/2015 Document Reviewed: 12/03/2014 Elsevier Interactive Patient Education  2017 [place] Prevention in the Home Falls can cause injuries. They can happen to people of all ages. There are many things you can do to make your home safe and to help prevent falls. What can I do on the outside of my home?  Regularly fix the edges of walkways and driveways and fix any cracks.  Remove anything that might make you trip as you walk through a door, such as a raised step or threshold.  Trim any bushes or trees on the path to your home.  Use bright outdoor lighting.  Clear any walking paths of anything that might make someone trip, such as rocks or tools.  Regularly check to see if handrails are loose or broken. Make sure that both sides of any steps have handrails.  Any raised decks and porches should have guardrails on the edges.  Have any leaves, snow, or ice cleared regularly.  Use sand or salt on walking paths during winter.  Clean up any spills in your garage right away. This includes oil or grease spills. What can I do in the bathroom?  Use night lights.  Install grab bars by the toilet and in the tub and shower. Do not use towel bars as grab bars.  Use non-skid mats or decals in the tub or shower.  If you need to sit down in the shower, use a plastic, non-slip  stool.  Keep the floor dry. Clean up any water that spills on the floor as soon as it happens.  Remove soap buildup in the tub or shower regularly.  Attach bath mats securely with double-sided non-slip rug tape.  Do not have throw rugs and other things on the floor that can make you trip. What can I do in the bedroom?  Use night lights.  Make sure that you have a light by your bed that is easy to reach.  Do not use any sheets or blankets that are too big for your bed. They should not hang down onto the floor.  Have a firm chair that has side arms. You can use this for support while you get dressed.  Do not have throw rugs and other things on the floor that can make you trip. What can I do in the kitchen?  Clean up any spills right away.  Avoid walking on wet floors.  Keep items that you use a lot in easy-to-reach places.  If you need to reach something above you, use a strong step stool that has a grab bar.  Keep electrical cords out of the way.  Do not use floor polish or wax that makes floors  slippery. If you must use wax, use non-skid floor wax.  Do not have throw rugs and other things on the floor that can make you trip. What can I do with my stairs?  Do not leave any items on the stairs.  Make sure that there are handrails on both sides of the stairs and use them. Fix handrails that are broken or loose. Make sure that handrails are as long as the stairways.  Check any carpeting to make sure that it is firmly attached to the stairs. Fix any carpet that is loose or worn.  Avoid having throw rugs at the top or bottom of the stairs. If you do have throw rugs, attach them to the floor with carpet tape.  Make sure that you have a light switch at the top of the stairs and the bottom of the stairs. If you do not have them, ask someone to add them for you. What else can I do to help prevent falls?  Wear shoes that:  Do not have high heels.  Have rubber bottoms.  Are  comfortable and fit you well.  Are closed at the toe. Do not wear sandals.  If you use a stepladder:  Make sure that it is fully opened. Do not climb a closed stepladder.  Make sure that both sides of the stepladder are locked into place.  Ask someone to hold it for you, if possible.  Clearly mark and make sure that you can see:  Any grab bars or handrails.  First and last steps.  Where the edge of each step is.  Use tools that help you move around (mobility aids) if they are needed. These include:  Canes.  Walkers.  Scooters.  Crutches.  Turn on the lights when you go into a dark area. Replace any light bulbs as soon as they burn out.  Set up your furniture so you have a clear path. Avoid moving your furniture around.  If any of your floors are uneven, fix them.  If there are any pets around you, be aware of where they are.  Review your medicines with your doctor. Some medicines can make you feel dizzy. This can increase your chance of falling. Ask your doctor what other things that you can do to help prevent falls. This information is not intended to replace advice given to you by your health care provider. Make sure you discuss any questions you have with your health care provider. Document Released: 11/28/2008 Document Revised: 07/10/2015 Document Reviewed: 03/08/2014 Elsevier Interactive Patient Education  2017 [employer].

## 2019-11-23 ENCOUNTER — Other Ambulatory Visit: Payer: Self-pay | Admitting: Family Medicine

## 2019-11-23 NOTE — Telephone Encounter (Signed)
Medication Refill - Medication: esomeprazole (NEXIUM) 20 MG capsule [[id number]]     Preferred Pharmacy (with phone number or street name):  [facility], [address] [place] [address]  Phone: [phone] Fax: [phone]  Hours: Not open 24 hours     Agent: Please be advised that RX refills may take up to 3 business days. We ask that you follow-up with your pharmacy.

## 2019-11-23 NOTE — Telephone Encounter (Signed)
Requested medication (s) are due for refill today - unsure  Requested medication (s) are on the active medication list -yes  Future visit scheduled -yes  Last refill: 03/13/19  Notes to clinic: Original Rx #[id number] RF- sent for review of request  Requested Prescriptions  Pending Prescriptions Disp Refills   esomeprazole (NEXIUM) 20 MG capsule 30 capsule 0    Sig: Take 1 capsule (20 mg total) by mouth 2 (two) times daily.      Gastroenterology: Proton Pump Inhibitors Passed - 11/23/2019  2:21 PM      Passed - Valid encounter within last 12 months    Recent Outpatient Visits           3 weeks ago Chronic systolic congestive heart failure [facility])   [facility] Jerrol Banana., MD   3 months ago [place] Jerrol Banana., MD   8 months ago Centrilobular emphysema [facility])   [facility] Jerrol Banana., MD   11 months ago HFrEF (heart failure with reduced ejection fraction) [facility])   [facility] Jerrol Banana., MD   11 months ago 3-vessel CAD   [facility] Jerrol Banana., MD       Future Appointments             In 3 months Jerrol Banana., MD [facility], [facility]                Requested Prescriptions  Pending Prescriptions Disp Refills   esomeprazole (NEXIUM) 20 MG capsule 30 capsule 0    Sig: Take 1 capsule (20 mg total) by mouth 2 (two) times daily.      Gastroenterology: Proton Pump Inhibitors Passed - 11/23/2019  2:21 PM      Passed - Valid encounter within last 12 months    Recent Outpatient Visits           3 weeks ago Chronic systolic congestive heart failure [facility])   [facility] Jerrol Banana., MD   3 months ago [place] Jerrol Banana., MD   8 months ago Centrilobular emphysema [facility])   [facility] Jerrol Banana., MD   11  months ago HFrEF (heart failure with reduced ejection fraction) [facility])   [facility] Jerrol Banana., MD   11 months ago 3-vessel CAD   [facility] Jerrol Banana., MD       Future Appointments             In 3 months Jerrol Banana., MD [facility], [place]

## 2019-11-26 MED ORDER — ESOMEPRAZOLE MAGNESIUM 20 MG PO CPDR: 20.0000 mg | DELAYED_RELEASE_CAPSULE | Freq: Two times a day (BID) | ORAL | 0 refills | Status: AC

## 2019-12-31 ENCOUNTER — Ambulatory Visit (INDEPENDENT_AMBULATORY_CARE_PROVIDER_SITE_OTHER): Payer: Medicare Other | Admitting: Podiatry

## 2019-12-31 ENCOUNTER — Other Ambulatory Visit: Payer: Self-pay

## 2019-12-31 ENCOUNTER — Encounter: Payer: Self-pay | Admitting: Podiatry

## 2019-12-31 DIAGNOSIS — M79675 Pain in left toe(s): Secondary | ICD-10-CM

## 2019-12-31 DIAGNOSIS — L608 Other nail disorders: Secondary | ICD-10-CM | POA: Diagnosis not present

## 2019-12-31 DIAGNOSIS — M79674 Pain in right toe(s): Secondary | ICD-10-CM | POA: Diagnosis not present

## 2019-12-31 DIAGNOSIS — B351 Tinea unguium: Secondary | ICD-10-CM | POA: Diagnosis not present

## 2019-12-31 DIAGNOSIS — A498 Other bacterial infections of unspecified site: Secondary | ICD-10-CM | POA: Insufficient documentation

## 2019-12-31 NOTE — Progress Notes (Signed)
  Subjective:  Patient ID: Johnathan [name], male    DOB: May 10, 1925,  MRN: [id number]  Chief Complaint  Patient presents with  . Nail Problem    nail trim.  He c/o ingrown fungal nail left hallux lateral borders off and on x years  He says "it can get sore and tender at night when the sheets touch my toe"    86 y.o. male presents with the above complaint. History confirmed with patient.  Here with his daughter today.  The worst part is the lateral edge of the distal left hallux nail  Objective:  Physical Exam: Feet are cool, pulses are palpable, there is venous insufficiency with spider veins and varicose veins.  He has onychomycosis x10.  The distal lateral border of the left hallux nail is painful and has a small hangnail Assessment:   1. Onychomycosis   2. Pain due to onychomycosis of toenails of both feet   3. Pincer nail deformity      Plan:  Patient was evaluated and treated and all questions answered.  Discussed the etiology and treatment options for the condition in detail with the patient. Educated patient on the topical and oral treatment options for mycotic nails. Recommended debridement of the nails today. Sharp and mechanical debridement performed of all painful and mycotic nails today. Nails debrided in length and thickness using a nail nipper and a mechanical burr to level of comfort. Discussed treatment options including appropriate shoe gear. Follow up as needed for painful nails.  Slant back was performed on the left hallux nail and the edges were cleaned out to relieve the pressure to tolerance.  He said he felt much better after this was done.  Advised him that we can do this every few months as it becomes bothersome for him as needed  Return if symptoms worsen or fail to improve.

## 2020-02-20 ENCOUNTER — Ambulatory Visit: Payer: Self-pay | Admitting: Family Medicine

## 2020-03-05 ENCOUNTER — Ambulatory Visit: Payer: Self-pay | Admitting: Family Medicine

## 2020-03-18 DEATH — deceased
